# Patient Record
Sex: Male | Born: 1950
Health system: Southern US, Community
[De-identification: ages and names within clinical notes are randomized; demographics above are authoritative.]

## PROBLEM LIST (undated history)

## (undated) DIAGNOSIS — F529 Unspecified sexual dysfunction not due to a substance or known physiological condition: Secondary | ICD-10-CM

## (undated) DIAGNOSIS — F068 Other specified mental disorders due to known physiological condition: Secondary | ICD-10-CM

## (undated) DIAGNOSIS — G35D Multiple sclerosis, unspecified: Secondary | ICD-10-CM

## (undated) DIAGNOSIS — H579 Unspecified disorder of eye and adnexa: Secondary | ICD-10-CM

## (undated) DIAGNOSIS — L408 Other psoriasis: Secondary | ICD-10-CM

## (undated) DIAGNOSIS — F3289 Other specified depressive episodes: Secondary | ICD-10-CM

## (undated) DIAGNOSIS — G35 Multiple sclerosis: Secondary | ICD-10-CM

## (undated) DIAGNOSIS — R413 Other amnesia: Secondary | ICD-10-CM

## (undated) DIAGNOSIS — F329 Major depressive disorder, single episode, unspecified: Secondary | ICD-10-CM

## (undated) HISTORY — PX: TONSILLECTOMY: SUR1361

## (undated) HISTORY — PX: KNEE ARTHROSCOPY: SUR90

## (undated) HISTORY — DX: Other specified depressive episodes: F32.89

## (undated) HISTORY — DX: Other psoriasis: L40.8

## (undated) HISTORY — DX: Other specified mental disorders due to known physiological condition: F06.8

## (undated) HISTORY — DX: Unspecified disorder of eye and adnexa: H57.9

## (undated) HISTORY — DX: Other amnesia: R41.3

## (undated) HISTORY — DX: Major depressive disorder, single episode, unspecified: F32.9

## (undated) HISTORY — DX: Unspecified sexual dysfunction not due to a substance or known physiological condition: F52.9

---

## 2009-02-10 ENCOUNTER — Ambulatory Visit (HOSPITAL_COMMUNITY): Admission: RE | Admit: 2009-02-10 | Discharge: 2009-02-10 | Payer: Self-pay | Admitting: Specialist

## 2010-01-26 ENCOUNTER — Encounter: Payer: Self-pay | Admitting: Internal Medicine

## 2010-01-26 ENCOUNTER — Encounter (INDEPENDENT_AMBULATORY_CARE_PROVIDER_SITE_OTHER): Payer: Self-pay | Admitting: *Deleted

## 2010-03-18 DIAGNOSIS — H579 Unspecified disorder of eye and adnexa: Secondary | ICD-10-CM | POA: Insufficient documentation

## 2010-03-18 DIAGNOSIS — K5900 Constipation, unspecified: Secondary | ICD-10-CM | POA: Insufficient documentation

## 2010-03-18 DIAGNOSIS — F32A Depression, unspecified: Secondary | ICD-10-CM | POA: Insufficient documentation

## 2010-03-18 DIAGNOSIS — F329 Major depressive disorder, single episode, unspecified: Secondary | ICD-10-CM

## 2010-03-18 DIAGNOSIS — L408 Other psoriasis: Secondary | ICD-10-CM

## 2010-03-18 DIAGNOSIS — F09 Unspecified mental disorder due to known physiological condition: Secondary | ICD-10-CM

## 2010-03-18 DIAGNOSIS — K625 Hemorrhage of anus and rectum: Secondary | ICD-10-CM

## 2010-03-18 DIAGNOSIS — N529 Male erectile dysfunction, unspecified: Secondary | ICD-10-CM

## 2010-03-18 DIAGNOSIS — R634 Abnormal weight loss: Secondary | ICD-10-CM

## 2010-03-18 DIAGNOSIS — G35 Multiple sclerosis: Secondary | ICD-10-CM | POA: Insufficient documentation

## 2010-07-09 ENCOUNTER — Encounter: Admission: RE | Admit: 2010-07-09 | Discharge: 2010-07-09 | Payer: Self-pay | Admitting: Specialist

## 2010-11-08 NOTE — Letter (Signed)
Summary: New Patient letter  [facility]  [address] [place], [place] [address]   Phone: [phone]  Fax: [phone]       01/26/2010 MRN: [id number]  [facility] [address] [place], [place]  [address]  Dear Mr. BEADNELL,  Welcome to the Gastroenterology Division at [facility].    You are scheduled to see Dr. Juanda Chance on 03-23-10 at 9:15a.m. on the 3rd floor at [facility], [address]  We ask that you try to arrive at our office 15 minutes prior to your appointment time to allow for check-in.  We would like you to complete the enclosed self-administered evaluation form prior to your visit and bring it with you on the day of your appointment.  We will review it with you.  Also, please bring a complete list of all your medications or, if you prefer, bring the medication bottles and we will list them.  Please bring your insurance card so that we may make a copy of it.  If your insurance requires a referral to see a specialist, please bring your referral form from your primary care physician.  Co-payments are due at the time of your visit and may be paid by cash, check or credit card.     Your office visit will consist of a consult with your physician (includes a physical exam), any laboratory testing he/she may order, scheduling of any necessary diagnostic testing (e.g. x-ray, ultrasound, CT-scan), and scheduling of a procedure (e.g. Endoscopy, Colonoscopy) if required.  Please allow enough time on your schedule to allow for any/all of these possibilities.    If you cannot keep your appointment, please call [phone] to cancel or reschedule prior to your appointment date.  This allows [place] the opportunity to schedule an appointment for another patient in need of care.  If you do not cancel or reschedule by 5 p.m. the business day prior to your appointment date, you will be charged a $50.00 late cancellation/no-show fee.    Thank you for  choosing [facility] for your medical needs.  We appreciate the opportunity to care for you.  Please visit [place] at our website  to learn more about our practice.                     Sincerely,                                                             The Gastroenterology Division

## 2010-11-08 NOTE — Letter (Signed)
Summary: Office Kern Reap MD  Marlou Starks MD   Imported By: Sherian Rein 03/22/2010 14:13:30  _____________________________________________________________________  External Attachment:    Type:   Image     Comment:   External Document

## 2012-09-02 ENCOUNTER — Encounter (HOSPITAL_COMMUNITY): Payer: Self-pay | Admitting: Emergency Medicine

## 2012-09-02 ENCOUNTER — Emergency Department (HOSPITAL_COMMUNITY)
Admission: EM | Admit: 2012-09-02 | Discharge: 2012-09-02 | Disposition: A | Discharge status: Home / Self Care | Payer: Self-pay | Attending: Emergency Medicine | Admitting: Emergency Medicine

## 2012-09-02 ENCOUNTER — Emergency Department (HOSPITAL_COMMUNITY): Payer: Self-pay

## 2012-09-02 DIAGNOSIS — M549 Dorsalgia, unspecified: Secondary | ICD-10-CM | POA: Insufficient documentation

## 2012-09-02 DIAGNOSIS — F17200 Nicotine dependence, unspecified, uncomplicated: Secondary | ICD-10-CM | POA: Insufficient documentation

## 2012-09-02 DIAGNOSIS — R0789 Other chest pain: Secondary | ICD-10-CM | POA: Insufficient documentation

## 2012-09-02 DIAGNOSIS — G35 Multiple sclerosis: Secondary | ICD-10-CM | POA: Insufficient documentation

## 2012-09-02 HISTORY — DX: Multiple sclerosis, unspecified: G35.D

## 2012-09-02 HISTORY — DX: Multiple sclerosis: G35

## 2012-09-02 LAB — CBC WITH DIFFERENTIAL/PLATELET
Basophils Absolute: 0.1 10*3/uL (ref 0.0–0.1)
Eosinophils Absolute: 0.3 10*3/uL (ref 0.0–0.7)
Eosinophils Relative: 3 % (ref 0–5)
MCH: 31.4 pg (ref 26.0–34.0)
MCHC: 34.2 g/dL (ref 30.0–36.0)
MCV: 92 fL (ref 78.0–100.0)
Platelets: 216 10*3/uL (ref 150–400)
RDW: 13.3 % (ref 11.5–15.5)

## 2012-09-02 LAB — BASIC METABOLIC PANEL
Calcium: 9.5 mg/dL (ref 8.4–10.5)
GFR calc non Af Amer: >90 mL/min (ref >90)
Glucose, Bld: 115 mg/dL — ABNORMAL HIGH (ref 70–99)
Sodium: 140 mEq/L (ref 135–145)

## 2012-09-02 LAB — POCT I-STAT TROPONIN I: Troponin i, poc: 0.01 ng/mL (ref 0.00–0.08)

## 2012-09-02 MED ORDER — OXYCODONE-ACETAMINOPHEN 5-325 MG PO TABS
1.0000 | ORAL_TABLET | Freq: Once | ORAL | Status: AC
  Administered 2012-09-02: 1 via ORAL
  Filled 2012-09-02: qty 1

## 2012-09-02 MED ORDER — OXYCODONE-ACETAMINOPHEN 5-325 MG PO TABS: 1.0000 | ORAL_TABLET | ORAL | Status: DC | PRN

## 2012-09-02 NOTE — ED Notes (Signed)
Pt c/o left sided CP x 2 weeks that is worse with movement; pt denies SOB or nausea

## 2012-09-02 NOTE — ED Notes (Addendum)
Pt c/o intermittent left chest pain that "feels like throbbing", states that at it's worst he would rate it a 5/10. NAD, A&Ox4, ambulatory.

## 2012-09-02 NOTE — ED Provider Notes (Signed)
History     CSN: [id number]  Arrival date & time 09/02/12  1332   First MD Initiated Contact with Patient 09/02/12 1605      Chief Complaint  Patient presents with  . Chest Pain    (Consider location/radiation/quality/duration/timing/severity/associated sxs/prior treatment) Patient is a 61 y.o. male presenting with chest pain. The history is provided by the patient and a friend. No language interpreter was used.  Chest Pain The chest pain began 1 - 2 weeks ago. Chest pain occurs constantly. At its most intense, the pain is at 4/10. The pain is currently at 2/10. The severity of the pain is mild. The quality of the pain is described as dull, heavy and aching. The pain does not radiate. Chest pain is worsened by certain positions. Pertinent negatives for primary symptoms include no fever, no fatigue, no syncope, no shortness of breath, no cough, no nausea, no vomiting and no dizziness.  Pertinent negatives for associated symptoms include no numbness and no weakness. He tried NSAIDs for the symptoms. Risk factors include male gender, substance abuse and smoking/tobacco exposure.    61yo male with the complaint of left chest pain x 2 weeks especially when he is lying down at night going to sleep on his left side. States the worst pain is a 4/10. States that the pain is a dull constant pain and it is 2/10 today. States that the pain is better after he takes Aleve. The pain does radiate to the L scapula when laying down. Causes no  shortness of breath or nausea. Patient has MS and goes to a neurologist. Patient does not have a PCP. Patient does smoke. He does not know if he  has high cholesterol. Patient denies any unilateral leg pain or long trips. Wells criteria met.     Past Medical History  Diagnosis Date  . Multiple sclerosis     History reviewed. No pertinent past surgical history.  History reviewed. No pertinent family history.  History  Substance Use Topics  . Smoking status:  Current Every Day Smoker  . Smokeless tobacco: Not on file  . Alcohol Use: Yes      Review of Systems  Constitutional: Negative for fever and fatigue.  Respiratory: Negative for cough and shortness of breath.   Cardiovascular: Positive for chest pain. Negative for syncope.  Gastrointestinal: Negative for nausea and vomiting.  Musculoskeletal: Positive for back pain.  Neurological: Negative for dizziness, syncope, weakness, light-headedness and numbness.    Allergies  Review of patient's allergies indicates no known allergies.  Home Medications  No current outpatient prescriptions on file.  BP 145/80  Pulse 65  Temp 98.2 F (36.8 C) (Oral)  Resp 16  SpO2 96%  Physical Exam  Nursing note and vitals reviewed. Constitutional: He is oriented to person, place, and time. He appears well-developed and well-nourished.  HENT:  Head: Normocephalic.  Eyes: Conjunctivae normal and EOM are normal. Pupils are equal, round, and reactive to light.  Neck: Normal range of motion. Neck supple.  Cardiovascular: Normal rate.   Pulmonary/Chest: Effort normal and breath sounds normal. No respiratory distress. He has no wheezes. He has no rales.  Abdominal: Soft. Bowel sounds are normal. He exhibits no distension. There is no tenderness.  Musculoskeletal: Normal range of motion.  Neurological: He is alert and oriented to person, place, and time.  Skin: Skin is warm and dry.  Psychiatric: He has a normal mood and affect.    ED Course  Procedures (including critical care time)  Labs Reviewed  BASIC METABOLIC PANEL - Abnormal; Notable for the following:    Glucose, Bld 115 (*)     All other components within normal limits  CBC WITH DIFFERENTIAL  POCT I-STAT TROPONIN I  D-DIMER, QUANTITATIVE   Dg Chest 2 View  09/02/2012  *RADIOLOGY REPORT*  Clinical Data: Chest pain.  CHEST - 2 VIEW  Comparison: None.  Findings: Two views of the chest demonstrate clear lungs. Heart and mediastinum are  within normal limits.  There are degenerative changes in the thoracic spine.  The trachea is midline. Degenerative facet changes in the lower cervical spine.  IMPRESSION: No acute cardiopulmonary disease.   Original Report Authenticated By: Richarda Overlie, M.D.      No diagnosis found.    MDM  61 yo male with c/o L chest pain that radiates to L scapula x 2 weeks.  Chest x-ray unremarkable and reviewed by myself.  Trop -.  All other labs unremarkable.  Atypical for ACS.  Patient and sister agree he will get a pcp to follow up with this week. He will call [facility] to see him this week as well.     Date: 09/02/2012  Rate: 68  Rhythm: normal sinus rhythm  QRS Axis: normal  Intervals: normal  ST/T Wave abnormalities: normal  Conduction Disutrbances:none  Narrative Interpretation:   Old EKG Reviewed: none available          Remi Haggard, NP 09/02/12 2200

## 2012-09-04 NOTE — ED Provider Notes (Signed)
Medical screening examination/treatment/procedure(s) were performed by non-physician practitioner and as supervising physician I was immediately available for consultation/collaboration.   Suzi Roots, MD 09/04/12 [phone]

## 2012-09-06 ENCOUNTER — Encounter: Payer: Self-pay | Admitting: *Deleted

## 2012-09-09 ENCOUNTER — Institutional Professional Consult (permissible substitution): Payer: Self-pay | Admitting: Cardiovascular Disease

## 2012-09-27 ENCOUNTER — Encounter: Payer: Self-pay | Admitting: Cardiovascular Disease

## 2012-10-23 ENCOUNTER — Encounter: Payer: Self-pay | Admitting: Cardiovascular Disease

## 2014-04-08 DIAGNOSIS — G35 Multiple sclerosis: Secondary | ICD-10-CM | POA: Diagnosis not present

## 2014-06-11 DIAGNOSIS — G3184 Mild cognitive impairment, so stated: Secondary | ICD-10-CM | POA: Diagnosis not present

## 2014-06-11 DIAGNOSIS — E559 Vitamin D deficiency, unspecified: Secondary | ICD-10-CM | POA: Diagnosis not present

## 2014-06-11 DIAGNOSIS — N319 Neuromuscular dysfunction of bladder, unspecified: Secondary | ICD-10-CM | POA: Diagnosis not present

## 2014-06-11 DIAGNOSIS — G35 Multiple sclerosis: Secondary | ICD-10-CM | POA: Diagnosis not present

## 2014-06-24 DIAGNOSIS — G35 Multiple sclerosis: Secondary | ICD-10-CM | POA: Diagnosis not present

## 2014-09-10 DIAGNOSIS — E559 Vitamin D deficiency, unspecified: Secondary | ICD-10-CM | POA: Diagnosis not present

## 2014-09-10 DIAGNOSIS — G3184 Mild cognitive impairment, so stated: Secondary | ICD-10-CM | POA: Diagnosis not present

## 2014-09-10 DIAGNOSIS — G35 Multiple sclerosis: Secondary | ICD-10-CM | POA: Diagnosis not present

## 2014-09-22 DIAGNOSIS — Z23 Encounter for immunization: Secondary | ICD-10-CM | POA: Diagnosis not present

## 2015-01-12 ENCOUNTER — Telehealth: Payer: Self-pay | Admitting: *Deleted

## 2015-01-12 NOTE — Telephone Encounter (Signed)
LMOM (cell #) for pt. to call.  Home # is not in service.  He was incorrectly scheculed in a f/u appt. on 4-13 instead of a new pt. appt.--I need to r/s him.  Pt. returned my call as I was still typing this encounter--I have rescheduled him/fim

## 2015-01-15 ENCOUNTER — Ambulatory Visit (INDEPENDENT_AMBULATORY_CARE_PROVIDER_SITE_OTHER): Payer: Medicare Other | Admitting: Neurology

## 2015-01-15 ENCOUNTER — Encounter: Payer: Self-pay | Admitting: Neurology

## 2015-01-15 VITALS — BP 138/74 | HR 64 | Resp 14 | Ht 73.0 in | Wt 207.0 lb

## 2015-01-15 DIAGNOSIS — F329 Major depressive disorder, single episode, unspecified: Secondary | ICD-10-CM

## 2015-01-15 DIAGNOSIS — K5900 Constipation, unspecified: Secondary | ICD-10-CM

## 2015-01-15 DIAGNOSIS — F09 Unspecified mental disorder due to known physiological condition: Secondary | ICD-10-CM

## 2015-01-15 DIAGNOSIS — G35 Multiple sclerosis: Secondary | ICD-10-CM | POA: Diagnosis not present

## 2015-01-15 DIAGNOSIS — R3911 Hesitancy of micturition: Secondary | ICD-10-CM | POA: Insufficient documentation

## 2015-01-15 DIAGNOSIS — Z79899 Other long term (current) drug therapy: Secondary | ICD-10-CM | POA: Diagnosis not present

## 2015-01-15 DIAGNOSIS — N521 Erectile dysfunction due to diseases classified elsewhere: Secondary | ICD-10-CM

## 2015-01-15 DIAGNOSIS — F32A Depression, unspecified: Secondary | ICD-10-CM

## 2015-01-15 MED ORDER — METHYLPHENIDATE HCL 10 MG PO TABS: ORAL_TABLET | ORAL | Status: DC

## 2015-01-15 MED ORDER — TAMSULOSIN HCL 0.4 MG PO CAPS: 0.4000 mg | ORAL_CAPSULE | Freq: Every day | ORAL | Status: DC

## 2015-01-15 NOTE — Progress Notes (Signed)
[facility]  PATIENT: Gabriel Hanson DOB: 1951-09-08  REFERRING DOCTOR OR PCP:  None  SOURCE: patient  _________________________________   HISTORICAL  CHIEF COMPLAINT:  Chief Complaint  Patient presents with  . Multiple Sclerosis    Sts. he tolerates Tecfidera well--but often forgets his evening dose.  He denies new or worsening sx/fim    HISTORY OF PRESENT ILLNESS:  Gabriel Hanson is a 64 yo man with MS.    He was diagnosed with MS in 2001 when he presented with right visual loss.     He had an MRI worrisome for MS.   He was placed on IV steroids and vision got 80% better.    He was placed on an interferon first and then Copaxone because he had tolerability issues.     For two years, he was without insurance and stopped all DMT.   I started seeing him about a year ago and we started Tecfidera.   He tolerates it well but sometimes forgets the second pill because all his medications are qAM.    Gait/strength/sensation:   He denies any major problem with gait though he feels off balanced, especially with turns.   He stumbles but never falls.    He denies weakness but has numbness and tingling in both feet that stated in his toes and more recently involves the whole distal foot, bilaterally.  He still feels pain but there is some tingling and allodynia.     Vision:   Although his right eye improved, he never reached baseline.  There is blurriness but colors seem ok.   He feels the left eye has learned to compensate for the right.  Also in 2001, he had an episode of diplopia x 2 weeks and used an eye patch while driving.  No diplopia or eye pain now.    Bladder/bowel:   He has urinary hesitancy and frequency.  He stopped the medication x one week and noted it was helping him a lot.  He denies UTI's.   He has constipation with painful BM's only 1 -2 times a week  Fatigue/Sleep:   He has physical and mental fatigue.   Ritalin did not help the physical fatigue but did help  the mental fog and mental fatigue.     He sleeps well most nights having insomnia only about once a month (sleep onset only)  Mood/Cognition:   He denies anxiety or depression now.  In 1999 he seriously contemplated suicide and went on anti-depressants but stopped after a while.  He never had another suicidal ideation.  However, he feels he gets moody at times.   He notes he cries easily if he watches a sad show.   He felt the ritalin helped his processing speed and ability to multi-task.   He was much less distractible on it.      I personally reviewed medical records and his MRI images from 07/09/2010. The MRI shows white matter foci in the periventricular and juxtacortical white matter consistent with the clinical diagnosis of MS. The right optic nerve was reduced in size. There w was one focus in the right pons and one in the left cerebellar hemisphere, as well. None of the foci were enhancing during that MRI. There is no definite change when compared to an earlier MRI dated 02/10/2009.  REVIEW OF SYSTEMS: Constitutional: No fevers, chills, sweats, or change in appetite Eyes: No visual changes, double vision, eye pain Ear, nose and throat: No hearing loss,  ear pain, nasal congestion, sore throat Cardiovascular: No chest pain, palpitations Respiratory: No shortness of breath at rest or with exertion.   No wheezes GastrointestinaI: No nausea, vomiting, diarrhea, abdominal pain, fecal incontinence.  Severe constipation Genitourinary: as above.  Musculoskeletal: No neck pain, back pain Integumentary: No rash, pruritus, skin lesions Neurological: as above Psychiatric: mild depression.  Some tearfulness inappropriately.  Cognitive decline.  No anxiety Endocrine: No palpitations, diaphoresis, change in appetite, change in weigh or increased thirst Hematologic/Lymphatic: No anemia, purpura, petechiae. Allergic/Immunologic: No itchy/runny eyes, nasal congestion, recent allergic reactions,  rashes  ALLERGIES: No Known Allergies  HOME MEDICATIONS:  Current outpatient prescriptions:  .  Dimethyl Fumarate 240 MG CPDR, Take 240 mg by mouth 2 (two) times daily., Disp: , Rfl:  .  Multiple Vitamin (MULTIVITAMIN WITH MINERALS) TABS, Take 1 tablet by mouth daily., Disp: , Rfl:  .  sodium chloride (OCEAN) 0.65 % nasal spray, Place 1 spray into the nose daily as needed. For congestion, Disp: , Rfl:  .  tamsulosin (FLOMAX) 0.4 MG CAPS capsule, Take 0.4 mg by mouth daily., Disp: , Rfl: 1 .  naproxen sodium (ANAPROX) 220 MG tablet, Take 220 mg by mouth 2 (two) times daily with a meal., Disp: , Rfl:   PAST MEDICAL HISTORY: Past Medical History  Diagnosis Date  . Multiple sclerosis   . 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 Depressive disorder, not elsewhere classified   . Other psoriasis   . Psychosexual dysfunction, unspecified   . Disorder of eye, unspecified   . Memory loss     PAST SURGICAL HISTORY: Past Surgical History  Procedure Laterality Date  . Tonsillectomy    . Knee arthroscopy      right     FAMILY HISTORY: Family History  Problem Relation Age of Onset  . Breast cancer Mother   . Heart disease    . COPD Father     SOCIAL HISTORY:  History   Social History  . Marital Status: Single    Spouse Name: N/A  . Number of Children: N/A  . Years of Education: N/A   Occupational History  . [employer]    Fulltime   Social History Main Topics  . Smoking status: Former Smoker    Quit date: 01/09/2015  . Smokeless tobacco: Not on file  . Alcohol Use: 0.0 oz/week    0 Standard drinks or equivalent per week     Comment: Occasionally/fim  . Drug Use: Yes    Special: Marijuana  . Sexual Activity: Not on file   Other Topics Concern  . Not on file   Social History Narrative     PHYSICAL EXAM  Filed Vitals:   01/15/15 0835  BP: 138/74  Pulse: 64  Resp: 14  Height:  (1.854 m)  Weight:  207 lb (93.895 kg)    Body mass index is 27.32 kg/(m^2).   General: The patient is well-developed and well-nourished and in no acute distress  Eyes:  Funduscopic exam shows normal optic discs and retinal vessels.  Neck: The neck is supple, no carotid bruits are noted.  The neck is nontender.  Cardiovascular: The heart has a regular rate and rhythm with a normal S1 and S2. There were no murmurs, gallops or rubs. Lungs are clear to auscultation.  Skin: Extremities are without significant edema.  Musculoskeletal:  Back is nontender  Neurologic Exam  Mental status: The patient is alert and oriented x 3  at the time of the examination. The patient has apparent normal recent and remote memory, with an apparently normal attention span and concentration ability.   Speech is normal.  Cranial nerves: Extraocular movements are full. Pupils show 1+ right APD   Mild color desat's on right.  Visual fields are full.  Facial symmetry is present. There is good facial sensation to soft touch bilaterally.Facial strength is normal.  Trapezius and sternocleidomastoid strength is normal. No dysarthria is noted.  The tongue is midline, and the patient has symmetric elevation of the soft palate. No obvious hearing deficits are noted.  Motor:  Muscle bulk is normal.   Tone is normal. Strength is  5 / 5 in all 4 extremities.   Sensory: Sensory testing is intact to pinprick, soft touch and vibration sensation in arms but decreased vibration at toes and ankle.  Coordination: Cerebellar testing reveals good finger-nose-finger and heel-to-shin bilaterally.  Gait and station: Station is normal.   Gait is normal. Tandem gait is mildly wide. Romberg is negative.   Reflexes: Deep tendon reflexes are symmetric and normal in arms and knees, absent at ankles .   Plantar responses are flexor.    DIAGNOSTIC DATA (LABS, IMAGING, TESTING) - I reviewed patient records, labs, notes, testing and imaging myself where  available.  Lab Results  Component Value Date   WBC 8.5 09/02/2012   HGB 15.0 09/02/2012   HCT 43.9 09/02/2012   MCV 92.0 09/02/2012   PLT 216 09/02/2012      Component Value Date/Time   NA 140 09/02/2012 1539   K 3.9 09/02/2012 1539   CL 102 09/02/2012 1539   CO2 30 09/02/2012 1539   GLUCOSE 115* 09/02/2012 1539   BUN 12 09/02/2012 1539   CREATININE 0.84 09/02/2012 1539   CALCIUM 9.5 09/02/2012 1539   GFRNONAA >90 09/02/2012 1539   GFRAA >90 09/02/2012 1539   No results found for: CHOL, HDL, LDLCALC, LDLDIRECT, TRIG, CHOLHDL No results found for: ZOXW9U No results found for: VITAMINB12 No results found for: TSH     ASSESSMENT AND PLAN  Multiple sclerosis - Plan: CBC with Differential/Platelet  High risk medication use - Plan: CBC with Differential/Platelet  Urinary hesitancy  Constipation, unspecified constipation type  Cognitive dysfunction  Depression  Erectile dysfunction due to diseases classified elsewhere   In summary, Gabriel Hanson is a 64 year old man with relapsing remitting multiple sclerosis currently being treated with Tecfidera. His main impairments are difficulties with cognition, fatigue and bladder.   He reports that the bladder function improved quite a bit on tamsulosin with much less hesitancy. However, he ran out of medications and notes that symptoms have returned. He believes he got a pretty significant benefit from methylphenidate with less distractibility and better cognition. I will increase his dose to 20 mg in the morning and 10 at noon. However, did not help his physical fatigue any. He notes easy tearfulness, especially with sad movies and we discussed that he likely has a mild form of pseudobulbar affect. The severity does not appear to be enough to warrant treatment at this point. We also discussed possible treatment for the erectile dysfunction. At this point, he does not think her treatment is necessary but he would consider in the  future I will go ahead and check a CBC to make sure that he does not have a lymphopenia as Tecfidera associated lymphopenia may be associated with a higher risk of PML.    I also advised him to try MiraLAX for  his constipation 1 scoop every other night.  He will return to see me in 4 months or sooner if he has new or worsening neurologic symptoms.   Gabriel Hanson A. Epimenio Foot, MD, PhD 01/15/2015, 8:45 AM Certified in Neurology, Clinical Neurophysiology, Sleep Medicine, Pain Medicine and Neuroimaging  [facility] [address] [place], [place] [address] [phone]

## 2015-01-16 LAB — CBC WITH DIFFERENTIAL/PLATELET
BASOS: 1 %
Basophils Absolute: 0 10*3/uL (ref 0.0–0.2)
EOS ABS: 0.1 10*3/uL (ref 0.0–0.4)
Eos: 2 %
HCT: 45.3 % (ref 37.5–51.0)
HEMOGLOBIN: 15.1 g/dL (ref 12.6–17.7)
IMMATURE GRANS (ABS): 0 10*3/uL (ref 0.0–0.1)
Immature Granulocytes: 0 %
LYMPHS ABS: 1 10*3/uL (ref 0.7–3.1)
Lymphs: 19 %
MCH: 30.9 pg (ref 26.6–33.0)
MCHC: 33.3 g/dL (ref 31.5–35.7)
MCV: 93 fL (ref 79–97)
MONOCYTES: 8 %
MONOS ABS: 0.4 10*3/uL (ref 0.1–0.9)
NEUTROS PCT: 70 %
Neutrophils Absolute: 3.4 10*3/uL (ref 1.4–7.0)
PLATELETS: 217 10*3/uL (ref 150–379)
RBC: 4.89 x10E6/uL (ref 4.14–5.80)
RDW: 13.6 % (ref 12.3–15.4)
WBC: 4.9 10*3/uL (ref 3.4–10.8)

## 2015-01-18 ENCOUNTER — Telehealth: Payer: Self-pay | Admitting: *Deleted

## 2015-01-18 NOTE — Telephone Encounter (Signed)
-----   Message from Asa Lente, MD sent at 01/17/2015  3:34 PM EDT ----- Blood counts are good.    Stay on Tecfidera

## 2015-01-18 NOTE — Telephone Encounter (Signed)
Spoke with Jonny Ruiz, and per RAS, advised labwork is ok; he should continue Tecfidera as rx'd.  Gurnie verbalized understanding of same/fim

## 2015-01-20 ENCOUNTER — Ambulatory Visit: Payer: Self-pay | Admitting: Neurology

## 2015-05-17 ENCOUNTER — Encounter: Payer: Self-pay | Admitting: Neurology

## 2015-05-17 ENCOUNTER — Ambulatory Visit (INDEPENDENT_AMBULATORY_CARE_PROVIDER_SITE_OTHER): Payer: Medicare Other | Admitting: Neurology

## 2015-05-17 VITALS — BP 114/70 | HR 68 | Resp 14 | Ht 73.0 in | Wt 194.0 lb

## 2015-05-17 DIAGNOSIS — G35 Multiple sclerosis: Secondary | ICD-10-CM

## 2015-05-17 DIAGNOSIS — R269 Unspecified abnormalities of gait and mobility: Secondary | ICD-10-CM

## 2015-05-17 DIAGNOSIS — F09 Unspecified mental disorder due to known physiological condition: Secondary | ICD-10-CM | POA: Diagnosis not present

## 2015-05-17 DIAGNOSIS — R200 Anesthesia of skin: Secondary | ICD-10-CM | POA: Diagnosis not present

## 2015-05-17 DIAGNOSIS — Z79899 Other long term (current) drug therapy: Secondary | ICD-10-CM

## 2015-05-17 DIAGNOSIS — K5909 Other constipation: Secondary | ICD-10-CM

## 2015-05-17 DIAGNOSIS — R3911 Hesitancy of micturition: Secondary | ICD-10-CM | POA: Diagnosis not present

## 2015-05-17 MED ORDER — METHYLPHENIDATE HCL ER (OSM) 36 MG PO TBCR: 36.0000 mg | EXTENDED_RELEASE_TABLET | Freq: Every day | ORAL | Status: DC

## 2015-05-17 NOTE — Progress Notes (Signed)
[facility]  PATIENT: Gabriel Hanson DOB: 1951-02-04  REFERRING DOCTOR OR PCP:  None  SOURCE: patient  _________________________________   HISTORICAL  CHIEF COMPLAINT:  Chief Complaint  Patient presents with  . Multiple Sclerosis    Sts. he tolerates Tecfidera well, but frequently forgets his pm dose.  Sts. fatigue is some worse--he isn't sure Ritalin helps./fim    HISTORY OF PRESENT ILLNESS:  Gabriel Hanson is a 64 yo man with MS.    His main concern is worsening fatigue.   He has been on Tecfidera since 2015.   He tolerates it well but sometimes misses the afternoon pill.     Fatigue/Attention/Sleep:   He reports physical and mental fatigue.   In the past , ritalin helpe the mental fog and mental fatigue and he got a lot more done.    He thinks he may have had ADD as a youth (his sister a childhood education PhD told him she felt he did when younger).  He sleeps well most nights having insomnia rarely.     He remains active and walks daily.     Gait/strength/sensation:   He feels gait is clumsy but walks without support.  .   He stumbles but never falls.    He denies weakness in limbs.   He has numbness in his feet and ankles.  He notes mild discomfort at times but not pain.  There is some tingling and allodynia.     Vision:   He sees black specks in his vision.   He had right ON in past.  There is blurriness but colors seem ok.   He feels the left eye has learned to compensate for the right. He had an episode of diplopia x 2 weeks many years ago and used an eye patch while driving.  No diplopia or eye pain now.    Bladder/bowel:   He had urinary hesitancy and frequency helped some by tamsulosin (still has frequency).    He denies UTI's.   He has constipation with painful BM's only 1 -2 times a week. MiraLAX as only helped a little bit.  Mood/Cognition:   He denies current anxiety or depression now has a history of a severe major depression and he had suicidal  ideation. He never acted.   He notes he cries easily if he watches a sad show.   He felt the ritalin helped his processing speed and ability to multi-task.   He was much less distractible on it.      MS History:  He was diagnosed with MS in 2001 when he presented with right visual loss.     He had an MRI worrisome for MS.   He was placed on IV steroids and vision got 80% better.    He was placed on an interferon first and then Copaxone because he had tolerability issues.     For two years, he was without insurance and stopped all DMT.   I started seeing him about a year ago and we started Tecfidera.   He tolerates it well but sometimes forgets the second pill because all his medications are qAM.  MRI brain 2011 shows white matter foci in the periventricular and juxtacortical white matter consistent with the clinical diagnosis of MS. The right optic nerve was reduced in size. There w was one focus in the right pons and one in the left cerebellar hemisphere, as well. None of the foci were enhancing during that MRI.  REVIEW OF SYSTEMS: Constitutional: No fevers, chills, sweats, or change in appetite Eyes: No visual changes, double vision, eye pain Ear, nose and throat: No hearing loss, ear pain, nasal congestion, sore throat Cardiovascular: No chest pain, palpitations Respiratory: No shortness of breath at rest or with exertion.   No wheezes GastrointestinaI: No nausea, vomiting, diarrhea, abdominal pain, fecal incontinence.  Severe constipation Genitourinary: as above.  Musculoskeletal: No neck pain, back pain Integumentary: No rash, pruritus, skin lesions Neurological: as above Psychiatric: mild depression.  Some tearfulness inappropriately.  Cognitive decline.  No anxiety Endocrine: No palpitations, diaphoresis, change in appetite, change in weigh or increased thirst Hematologic/Lymphatic: No anemia, purpura, petechiae. Allergic/Immunologic: No itchy/runny eyes, nasal congestion, recent  allergic reactions, rashes  ALLERGIES: No Known Allergies  HOME MEDICATIONS:  Current outpatient prescriptions:  .  Dimethyl Fumarate 240 MG CPDR, Take 240 mg by mouth 2 (two) times daily., Disp: , Rfl:  .  naproxen sodium (ANAPROX) 220 MG tablet, Take 220 mg by mouth 2 (two) times daily with a meal., Disp: , Rfl:  .  sodium chloride (OCEAN) 0.65 % nasal spray, Place 1 spray into the nose daily as needed. For congestion, Disp: , Rfl:  .  tamsulosin (FLOMAX) 0.4 MG CAPS capsule, Take 1 capsule (0.4 mg total) by mouth daily., Disp: 30 capsule, Rfl: 11 .  methylphenidate (RITALIN) 10 MG tablet, One or two in morning and one at noon (Patient not taking: Reported on 05/17/2015), Disp: 90 tablet, Rfl: 0 .  Multiple Vitamin (MULTIVITAMIN WITH MINERALS) TABS, Take 1 tablet by mouth daily., Disp: , Rfl:   PAST MEDICAL HISTORY: Past Medical History  Diagnosis Date  . Multiple sclerosis   . 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 Depressive disorder, not elsewhere classified   . Other psoriasis   . Psychosexual dysfunction, unspecified   . Disorder of eye, unspecified   . Memory loss     PAST SURGICAL HISTORY: Past Surgical History  Procedure Laterality Date  . Tonsillectomy    . Knee arthroscopy      right     FAMILY HISTORY: Family History  Problem Relation Age of Onset  . Breast cancer Mother   . Heart disease    . COPD Father     SOCIAL HISTORY:  History   Social History  . Marital Status: Single    Spouse Name: N/A  . Number of Children: N/A  . Years of Education: N/A   Occupational History  . [employer]    Fulltime   Social History Main Topics  . Smoking status: Former Smoker    Quit date: 01/09/2015  . Smokeless tobacco: Not on file  . Alcohol Use: 0.0 oz/week    0 Standard drinks or equivalent per week     Comment: Occasionally/fim  . Drug Use: Yes    Special: Marijuana  . Sexual Activity: Not on file    Other Topics Concern  . Not on file   Social History Narrative     PHYSICAL EXAM  Filed Vitals:   05/17/15 0831  BP: 114/70  Pulse: 68  Resp: 14  Height:  (1.854 m)  Weight: 194 lb (87.998 kg)    Body mass index is 25.6 kg/(m^2).   General: The patient is well-developed and well-nourished and in no acute distress  Eyes:  Funduscopic exam shows normal optic discs and retinal vessels.  Neck: The neck is supple, no carotid bruits are noted.  The  neck is nontender.  Cardiovascular: The heart has a regular rate and rhythm with a normal S1 and S2. There were no murmurs, gallops or rubs. Lungs are clear to auscultation.  Skin: Extremities are without significant edema.  Musculoskeletal:  Back is nontender  Neurologic Exam  Mental status: The patient is alert and oriented x 3 at the time of the examination. The patient has apparent normal recent and remote memory, with an apparently normal attention span and concentration ability.   Speech is normal.  Cranial nerves: Extraocular movements are full. Pupils show 1+ right APD   Mild color desat's on right.  Visual fields are full.  Facial symmetry is present. There is good facial sensation to soft touch bilaterally.Facial strength is normal.  Trapezius and sternocleidomastoid strength is normal. No dysarthria is noted.  The tongue is midline, and the patient has symmetric elevation of the soft palate. No obvious hearing deficits are noted.  Motor:  Muscle bulk is normal.   Tone is normal. Strength is  5 / 5 in all 4 extremities.   Sensory: Sensory testing is intact to pinprick, soft touch and vibration sensation in arms but decreased vibration at toes and ankle.  Coordination: Cerebellar testing reveals good finger-nose-finger and heel-to-shin bilaterally.  Gait and station: Station is normal.   Gait is normal. Tandem gait is mildly wide. Romberg is negative.   Reflexes: Deep tendon reflexes are symmetric and 3 + arms and  spread at knees, absent at ankles .   Plantar responses are flexor.    DIAGNOSTIC DATA (LABS, IMAGING, TESTING) - I reviewed patient records, labs, notes, testing and imaging myself where available.  Lab Results  Component Value Date   WBC 4.9 01/15/2015   HGB 15.1 01/15/2015   HCT 45.3 01/15/2015   MCV 93 01/15/2015   PLT 217 01/15/2015      Component Value Date/Time   NA 140 09/02/2012 1539   K 3.9 09/02/2012 1539   CL 102 09/02/2012 1539   CO2 30 09/02/2012 1539   GLUCOSE 115* 09/02/2012 1539   BUN 12 09/02/2012 1539   CREATININE 0.84 09/02/2012 1539   CALCIUM 9.5 09/02/2012 1539   GFRNONAA >90 09/02/2012 1539   GFRAA >90 09/02/2012 1539       ASSESSMENT AND PLAN  Multiple sclerosis - Plan: CBC with Differential/Platelet  Numbness - Plan: Vitamin B12  Cognitive dysfunction - Plan: CBC with Differential/Platelet, Vitamin B12  Gait disturbance  Urinary hesitancy  High risk medication use - Plan: CBC with Differential/Platelet  Other constipation   1.  He will continue Tecfidera. We will check CBC with differential to make sure that there is not a lymphopenia. 2.   Continue tamsulosin. Bladder function worsens further, I would like him to see a urologist. 3.  I will place him on Concerta 36 mg so that he can get by with one pill a day at a higher dose than he was on. 4.   I advised him to try MiraLAX for his constipation 1 scoop every other night. If the problem worsens, he should check with his primary care doctor or GI. 5.   He has symmetric numbness in the feet. This may not be MS related. I will check B12 and consider additional testing with vasculitis labs, SPEP/IEF and EMG/NCV if this worsens.  He will return to see me in 4 months or sooner if he has new or worsening neurologic symptoms.   Mekayla Soman A. Epimenio Foot, MD, PhD 05/17/2015, 8:40 AM Certified in  Neurology, Clinical Neurophysiology, Sleep Medicine, Pain Medicine and Neuroimaging  [facility] [address], [address] [place], [place] [address] [phone]      MS History

## 2015-05-18 ENCOUNTER — Telehealth: Payer: Self-pay | Admitting: *Deleted

## 2015-05-18 LAB — CBC WITH DIFFERENTIAL/PLATELET
BASOS ABS: 0 10*3/uL (ref 0.0–0.2)
Basos: 1 %
EOS (ABSOLUTE): 0.1 10*3/uL (ref 0.0–0.4)
Eos: 2 %
HEMATOCRIT: 41.3 % (ref 37.5–51.0)
Hemoglobin: 14 g/dL (ref 12.6–17.7)
IMMATURE GRANS (ABS): 0 10*3/uL (ref 0.0–0.1)
Immature Granulocytes: 0 %
LYMPHS ABS: 0.5 10*3/uL — AB (ref 0.7–3.1)
Lymphs: 14 %
MCH: 31 pg (ref 26.6–33.0)
MCHC: 33.9 g/dL (ref 31.5–35.7)
MCV: 92 fL (ref 79–97)
MONOS ABS: 0.4 10*3/uL (ref 0.1–0.9)
Monocytes: 10 %
Neutrophils Absolute: 2.8 10*3/uL (ref 1.4–7.0)
Neutrophils: 73 %
Platelets: 210 10*3/uL (ref 150–379)
RBC: 4.51 x10E6/uL (ref 4.14–5.80)
RDW: 13.4 % (ref 12.3–15.4)
WBC: 3.9 10*3/uL (ref 3.4–10.8)

## 2015-05-18 LAB — VITAMIN B12: Vitamin B-12: 203 pg/mL — ABNORMAL LOW (ref 211–946)

## 2015-05-18 NOTE — Telephone Encounter (Signed)
I have spoken with Gabriel Hanson this afternoon and per RAS, advised vit. b12 level is low, and he should have one b12 inj. weekly for one month, then monthly after that.  He sts. since he doesn't drive, it is very difficult to come into the office for inj.  He believes his sister would be able to give him shots.  He will ask her to call me at her convenience and I will discuss this with her, provide training if she is willing/able/fim

## 2015-05-18 NOTE — Telephone Encounter (Signed)
-----   Message from Asa Lente, MD sent at 05/18/2015  8:18 AM EDT ----- Vit B12 is low.    He should do one shot a week x 4 weeks then one shot a month.     If he thinks he can give self shots, I can call in script.   If not he can come in for shots Lymphocytes are mildly lower than expected and we will need to recheck at next visit

## 2015-05-26 ENCOUNTER — Ambulatory Visit (INDEPENDENT_AMBULATORY_CARE_PROVIDER_SITE_OTHER): Payer: Medicare Other | Admitting: *Deleted

## 2015-05-26 ENCOUNTER — Telehealth: Payer: Self-pay | Admitting: *Deleted

## 2015-05-26 DIAGNOSIS — E539 Vitamin B deficiency, unspecified: Secondary | ICD-10-CM | POA: Diagnosis not present

## 2015-05-26 MED ORDER — CYANOCOBALAMIN 1000 MCG/ML IJ SOLN
1000.0000 ug | Freq: Once | INTRAMUSCULAR | Status: AC
  Administered 2015-05-26: 1000 ug via INTRAMUSCULAR

## 2015-05-26 NOTE — Telephone Encounter (Signed)
I have spoken with Kmari this morning.  He sts.  his sister is not able to give vit. b12 inj.  He will come in at 1330 this afternoon and I will give inj. for him/fim

## 2015-05-26 NOTE — Telephone Encounter (Signed)
-----   Message from Richard A Sater, MD sent at 05/18/2015  8:18 AM EDT ----- Vit B12 is low.    He should do one shot a week x 4 weeks then one shot a month.     If he thinks he can give self shots, I can call in script.   If not he can come in for shots Lymphocytes are mildly lower than expected and we will need to recheck at next visit 

## 2015-05-26 NOTE — Telephone Encounter (Signed)
I have spoken with Gabriel Hanson today.  His sister is not able to give his vit. b12 injections.  He will come in at 1330 today for an injection/fim

## 2015-05-26 NOTE — Telephone Encounter (Signed)
Patient is returning your call regarding shots

## 2015-05-26 NOTE — Telephone Encounter (Signed)
Gabriel Hanson presented to the office this afternoon.  Vitamin B12, 1,000mg  given IM left deltoid.  BP 100/70, HR 64 and regular.  Resp. 14, even and unlabored.  He was d/c home after inj. and was ambulatory out of the office without difficulty.  He will return next Wednesday, 06-02-15 for second injection/fim

## 2015-06-02 ENCOUNTER — Ambulatory Visit (INDEPENDENT_AMBULATORY_CARE_PROVIDER_SITE_OTHER): Payer: Medicare Other | Admitting: *Deleted

## 2015-06-02 DIAGNOSIS — E538 Deficiency of other specified B group vitamins: Secondary | ICD-10-CM | POA: Diagnosis not present

## 2015-06-02 MED ORDER — CYANOCOBALAMIN 1000 MCG/ML IJ SOLN
1000.0000 ug | Freq: Once | INTRAMUSCULAR | Status: AC
  Administered 2015-06-02: 1000 ug via INTRAMUSCULAR

## 2015-06-09 ENCOUNTER — Telehealth: Payer: Self-pay | Admitting: Neurology

## 2015-06-09 ENCOUNTER — Ambulatory Visit (INDEPENDENT_AMBULATORY_CARE_PROVIDER_SITE_OTHER): Payer: Medicare Other | Admitting: *Deleted

## 2015-06-09 DIAGNOSIS — E539 Vitamin B deficiency, unspecified: Secondary | ICD-10-CM

## 2015-06-09 MED ORDER — CYANOCOBALAMIN 1000 MCG/ML IJ SOLN
1000.0000 ug | Freq: Once | INTRAMUSCULAR | Status: AC
  Administered 2015-06-09: 1000 ug via INTRAMUSCULAR

## 2015-06-09 NOTE — Telephone Encounter (Signed)
Called again.  Got no answer.  Unable to leave message.

## 2015-06-09 NOTE — Telephone Encounter (Signed)
The patient is wanting a refill on methylphenidate. The best number to contact is [phone]

## 2015-06-09 NOTE — Telephone Encounter (Signed)
Rx was last written on 08/08 for a 30 day supply.  I called back.  Got no answer.  VM has not been set up yet, unable to leave message.

## 2015-06-10 NOTE — Telephone Encounter (Signed)
I called again.  Got no answer.  Unable to leave message.  

## 2015-06-15 ENCOUNTER — Other Ambulatory Visit: Payer: Self-pay

## 2015-06-15 MED ORDER — METHYLPHENIDATE HCL ER (OSM) 36 MG PO TBCR: 36.0000 mg | EXTENDED_RELEASE_TABLET | Freq: Every day | ORAL | Status: DC

## 2015-06-15 NOTE — Telephone Encounter (Signed)
Request has been approved by provider  

## 2015-06-16 ENCOUNTER — Ambulatory Visit (INDEPENDENT_AMBULATORY_CARE_PROVIDER_SITE_OTHER): Payer: Medicare Other | Admitting: *Deleted

## 2015-06-16 ENCOUNTER — Encounter: Payer: Self-pay | Admitting: *Deleted

## 2015-06-16 DIAGNOSIS — E538 Deficiency of other specified B group vitamins: Secondary | ICD-10-CM

## 2015-06-16 MED ORDER — CYANOCOBALAMIN 1000 MCG/ML IJ SOLN
1000.0000 ug | Freq: Once | INTRAMUSCULAR | Status: AC
  Administered 2015-06-16: 1000 ug via INTRAMUSCULAR

## 2015-06-16 NOTE — Progress Notes (Signed)
Concerta rx. up front [facility]/fim 

## 2015-06-23 ENCOUNTER — Telehealth: Payer: Self-pay | Admitting: Neurology

## 2015-06-23 MED ORDER — DIMETHYL FUMARATE 240 MG PO CPDR: 240.0000 mg | DELAYED_RELEASE_CAPSULE | Freq: Two times a day (BID) | ORAL | Status: DC

## 2015-06-23 NOTE — Telephone Encounter (Signed)
Patient called stating he is out of Tecfidera. He said it has lasted longer than it should have because he would forget to take the 2nd dose daily. He called [facility] which said they needed prior auth and was waiting for Dr office to respond. They also referred him to [facility].  He is confused. Please call and advise. He can be reached at [phone].

## 2015-06-23 NOTE — Telephone Encounter (Signed)
I called [facility].  Spoke with [employer].  He reviewed the patient file and said he does not see that a prior Berkley Harvey is needed.  States the pharmacy has changed to [facility], and it appears they just need a new Rx.  Prescription has been sent.  I called the patient back to advise.  Got no answer.  There was no option to leave message, as VM was not set up.

## 2015-06-30 ENCOUNTER — Telehealth: Payer: Self-pay | Admitting: Neurology

## 2015-06-30 NOTE — Telephone Encounter (Signed)
Pt called and is out of Tysabri, he needs a rx. He has been out since 9/18, Sunday. Pt says that the maker has changed but he can not remember what the name. [phone]

## 2015-07-10 DIAGNOSIS — Z23 Encounter for immunization: Secondary | ICD-10-CM | POA: Diagnosis not present

## 2015-07-14 ENCOUNTER — Telehealth: Payer: Self-pay | Admitting: Neurology

## 2015-07-14 ENCOUNTER — Ambulatory Visit (INDEPENDENT_AMBULATORY_CARE_PROVIDER_SITE_OTHER): Payer: Medicare Other | Admitting: *Deleted

## 2015-07-14 DIAGNOSIS — G35 Multiple sclerosis: Secondary | ICD-10-CM

## 2015-07-14 DIAGNOSIS — E538 Deficiency of other specified B group vitamins: Secondary | ICD-10-CM

## 2015-07-14 MED ORDER — CYANOCOBALAMIN 1000 MCG/ML IJ SOLN
1000.0000 ug | INTRAMUSCULAR | Status: DC
  Administered 2015-07-14 – 2015-09-16 (×4): 1000 ug via INTRAMUSCULAR

## 2015-07-14 NOTE — Telephone Encounter (Signed)
Pt called to tell this office he would be 15 min late. This operator told the pt that per policy for a 15 min appt. If pt was 5 or more min late we would r/s the appt and pt would not be seen. He said he was half way here and would still come and ask to be seen.

## 2015-07-14 NOTE — Telephone Encounter (Signed)
This is not a problem.  Pt. presented to the office just after 1pm and inj. was given/fim

## 2015-08-11 ENCOUNTER — Ambulatory Visit (INDEPENDENT_AMBULATORY_CARE_PROVIDER_SITE_OTHER): Payer: Medicare Other | Admitting: *Deleted

## 2015-08-11 DIAGNOSIS — E538 Deficiency of other specified B group vitamins: Secondary | ICD-10-CM | POA: Diagnosis not present

## 2015-08-11 DIAGNOSIS — G35 Multiple sclerosis: Secondary | ICD-10-CM | POA: Diagnosis not present

## 2015-09-16 ENCOUNTER — Ambulatory Visit (INDEPENDENT_AMBULATORY_CARE_PROVIDER_SITE_OTHER): Payer: Medicare Other | Admitting: Neurology

## 2015-09-16 ENCOUNTER — Encounter: Payer: Self-pay | Admitting: Neurology

## 2015-09-16 VITALS — BP 118/76 | HR 72 | Resp 14 | Ht 73.0 in | Wt 192.4 lb

## 2015-09-16 DIAGNOSIS — G35 Multiple sclerosis: Secondary | ICD-10-CM

## 2015-09-16 DIAGNOSIS — N521 Erectile dysfunction due to diseases classified elsewhere: Secondary | ICD-10-CM | POA: Diagnosis not present

## 2015-09-16 DIAGNOSIS — E538 Deficiency of other specified B group vitamins: Secondary | ICD-10-CM | POA: Diagnosis not present

## 2015-09-16 DIAGNOSIS — R3911 Hesitancy of micturition: Secondary | ICD-10-CM

## 2015-09-16 DIAGNOSIS — R200 Anesthesia of skin: Secondary | ICD-10-CM | POA: Diagnosis not present

## 2015-09-16 DIAGNOSIS — R269 Unspecified abnormalities of gait and mobility: Secondary | ICD-10-CM

## 2015-09-16 DIAGNOSIS — Z79899 Other long term (current) drug therapy: Secondary | ICD-10-CM | POA: Diagnosis not present

## 2015-09-16 MED ORDER — TAMSULOSIN HCL 0.4 MG PO CAPS: 0.4000 mg | ORAL_CAPSULE | Freq: Every day | ORAL | Status: DC

## 2015-09-16 MED ORDER — BUPROPION HCL ER (XL) 150 MG PO TB24: 150.0000 mg | ORAL_TABLET | Freq: Every day | ORAL | Status: DC

## 2015-09-16 NOTE — Progress Notes (Signed)
[facility]  PATIENT: Gabriel Hanson DOB: 1951-01-30  REFERRING DOCTOR OR PCP:  None  SOURCE: patient  _________________________________   HISTORICAL  CHIEF COMPLAINT:  Chief Complaint  Patient presents with  . Multiple Sclerosis    Sts. he continues to tolerate Tecfidera well, is doing a better job at remembering his evening dose--sts. has only missed one dose in the last couple of weeks.  Sts. feels happier and less fatigued since starting vit. B12 inj.  Sts. is not taking Ritalin at all now./fim    HISTORY OF PRESENT ILLNESS:  Gabriel Hanson is a 64 yo man with MS.   His B12 was low and he is receiving IM injections.    He feels faituge and eye sight are both better since starting.   He has been on Tecfidera since 2015.   Compliance is better the past 2 months and he has only missed a couple doses.  Gait/strength/sensation:   He feels gait is clumsy but walks without support. He [place] has a torn meniscus in the right knee.    He is walking 5 miles a day.     He stumbles but never falls.    He denies weakness in limbs.   He has numbness in his feet and ankles.   There is mild tingling and allodynia.     Vision:   He had right ON in past. He has had some reduced VF on the right  He feels vision is better on B12.   Colors seem ok.    He had an episode of diplopia x 2 weeks many years ago and used an eye patch while driving.  No recent diplopia or eye pain now.    Bladder/bowel:   He had urinary hesitancy and frequency helped some by tamsulosin (still has frequency).    He denies UTI's.   He has constipation with painful BM's only 1 -2 times a week. MiraLAX did not help much.  Fatigue/Attention/Sleep:   He reports physical and mental fatigue.   He had ADD as a child and has had attentional problems.   He does bette rwith Ritalin but since starting B12 he feels he does not need it as much.    He sleeps well most nights having insomnia rarely.     He remains active and  walks daily.     Mood:   He is noting depression and feels his mood swings easily.    He doesn't get out of the house much.   He has a history of a severe major depression and he had suicidal ideation once but never acted.   He notes he cries easily if he watches a sad show.  Occasionally, he might laugh if sad or cry if happy.   Cognition:   He has had difficulties with attention and processing speed.    He felt the ritalin helped his processing speed and ability to multi-task but now that he is on B12, he doesn't feel he needs it as much.   .     MS History:  He was diagnosed with MS in 2001 when he presented with right visual loss.     He had an MRI worrisome for MS.   He was placed on IV steroids and vision got 80% better.    He was placed on an interferon first and then Copaxone because he had tolerability issues.     For two years, he was without insurance and stopped all DMT.  I started seeing him about a year ago and we started Tecfidera.   He tolerates it well but sometimes forgets the second pill because all his medications are qAM.  MRI brain 2011 shows white matter foci in the periventricular and juxtacortical white matter consistent with the clinical diagnosis of MS. The right optic nerve was reduced in size. There w was one focus in the right pons and one in the left cerebellar hemisphere, as well. None of the foci were enhancing during that MRI.    REVIEW OF SYSTEMS: Constitutional: No fevers, chills, sweats, or change in appetite Eyes: No visual changes, double vision, eye pain Ear, nose and throat: No hearing loss, ear pain, nasal congestion, sore throat Cardiovascular: No chest pain, palpitations Respiratory: No shortness of breath at rest or with exertion.   No wheezes GastrointestinaI: No nausea, vomiting, diarrhea, abdominal pain, fecal incontinence.  Severe constipation Genitourinary: as above.  Musculoskeletal: No neck pain, back pain Integumentary: No rash, pruritus,  skin lesions Neurological: as above Psychiatric: Depression.  Some tearfulness inappropriately.  Cognitive decline.  No anxiety Endocrine: No palpitations, diaphoresis, change in appetite, change in weigh or increased thirst Hematologic/Lymphatic: No anemia, purpura, petechiae. Allergic/Immunologic: No itchy/runny eyes, nasal congestion, recent allergic reactions, rashes  ALLERGIES: No Known Allergies  HOME MEDICATIONS:  Current outpatient prescriptions:  .  Dimethyl Fumarate 240 MG CPDR, Take 1 capsule (240 mg total) by mouth 2 (two) times daily., Disp: 60 capsule, Rfl: 6 .  naproxen sodium (ANAPROX) 220 MG tablet, Take 220 mg by mouth 2 (two) times daily with a meal., Disp: , Rfl:  .  tamsulosin (FLOMAX) 0.4 MG CAPS capsule, Take 1 capsule (0.4 mg total) by mouth daily., Disp: 30 capsule, Rfl: 11 .  methylphenidate (CONCERTA) 36 MG PO CR tablet, Take 1 tablet (36 mg total) by mouth daily. (Patient not taking: Reported on 09/16/2015), Disp: 30 tablet, Rfl: 0 .  Multiple Vitamin (MULTIVITAMIN WITH MINERALS) TABS, Take 1 tablet by mouth daily., Disp: , Rfl:  .  sodium chloride (OCEAN) 0.65 % nasal spray, Place 1 spray into the nose daily as needed. For congestion, Disp: , Rfl:   Current facility-administered medications:  .  cyanocobalamin ((VITAMIN B-12)) injection 1,000 mcg, 1,000 mcg, Intramuscular, Q30 days, Asa Lente, MD, 1,000 mcg at 08/11/15 1715  PAST MEDICAL HISTORY: Past Medical History  Diagnosis Date  . Multiple sclerosis (HCC)   . 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 Depressive disorder, not elsewhere classified   . Other psoriasis   . Psychosexual dysfunction, unspecified   . Disorder of eye, unspecified   . Memory loss     PAST SURGICAL HISTORY: Past Surgical History  Procedure Laterality Date  . Tonsillectomy    . Knee arthroscopy      right     FAMILY HISTORY: Family History  Problem Relation Age of Onset  .  Breast cancer Mother   . Heart disease    . COPD Father     SOCIAL HISTORY:  Social History   Social History  . Marital Status: Single    Spouse Name: N/A  . Number of Children: N/A  . Years of Education: N/A   Occupational History  . [employer]    Fulltime   Social History Main Topics  . Smoking status: Former Smoker    Quit date: 01/09/2015  . Smokeless tobacco: Not on file  . Alcohol Use: 0.0 oz/week    0 Standard drinks  or equivalent per week     Comment: Occasionally/fim  . Drug Use: Yes    Special: Marijuana  . Sexual Activity: Not on file   Other Topics Concern  . Not on file   Social History Narrative     PHYSICAL EXAM  Filed Vitals:   09/16/15 0841  BP: 118/76  Pulse: 72  Resp: 14  Height:  (1.854 m)  Weight: 192 lb 6.4 oz (87.272 kg)    Body mass index is 25.39 kg/(m^2).   General: The patient is well-developed and well-nourished and in no acute distress  Neurologic Exam  Mental status: The patient is alert and oriented x 3 at the time of the examination. The patient has apparent normal recent and remote memory, with an apparently normal  concentration ability but some distractibility.   Speech is normal.  Cranial nerves: Extraocular movements are full. Pupils show 1+ right APD  olor desaturated on right.  Visual fields are full.  Facial symmetry is present. There is good facial sensation to soft touch bilaterally.Facial strength is normal.  Trapezius and sternocleidomastoid strength is normal. No dysarthria is noted.  The tongue is midline, and the patient has symmetric elevation of the soft palate. No obvious hearing deficits are noted.  Motor:  Muscle bulk is normal.   Tone is normal. Strength is  5 / 5 in all 4 extremities.   Sensory: Sensory testing is intact to pinprick, soft touch and vibration sensation in arms but decreased vibration at toes and ankle.  Coordination: Cerebellar testing reveals good  finger-nose-finger and slightly reduced heel-to-shin bilaterally.  Gait and station: Station is normal.   Gait is normal. Tandem gait is mildly wide. Romberg is negative.   Reflexes: Deep tendon reflexes are symmetric and 3 + arms and spread at knees, trace at ankles .       DIAGNOSTIC DATA (LABS, IMAGING, TESTING) - I reviewed patient records, labs, notes, testing and imaging myself where available.  Lab Results  Component Value Date   WBC 3.9 05/17/2015   HGB 15.1 01/15/2015   HCT 41.3 05/17/2015   MCV 93 01/15/2015   PLT 217 01/15/2015      Component Value Date/Time   NA 140 09/02/2012 1539   K 3.9 09/02/2012 1539   CL 102 09/02/2012 1539   CO2 30 09/02/2012 1539   GLUCOSE 115* 09/02/2012 1539   BUN 12 09/02/2012 1539   CREATININE 0.84 09/02/2012 1539   CALCIUM 9.5 09/02/2012 1539   GFRNONAA >90 09/02/2012 1539   GFRAA >90 09/02/2012 1539       ASSESSMENT AND PLAN  MULTIPLE SCLEROSIS  High risk medication use  Numbness  Gait disturbance  Erectile dysfunction due to diseases classified elsewhere  Urinary hesitancy   1.  Continue Tecfidera. We will check CBC with differential to make sure that there is not a lymphopenia.    We will check another MRI next year to make sure there is no subclinical progression.   2.   Continue tamsulosin.   Continue B12 3.  Stay active    Continue to walk daily 4.  Add a stool softener 5.  He will return to see me in 6 months or sooner if he has new or worsening neurologic symptoms.   Katheryn Culliton A. Epimenio Foot, MD, PhD 09/16/2015, 8:48 AM Certified in Neurology, Clinical Neurophysiology, Sleep Medicine, Pain Medicine and Neuroimaging  [facility] [address] [place], [place] [address] [phone]      MS  History

## 2015-09-17 ENCOUNTER — Telehealth: Payer: Self-pay | Admitting: *Deleted

## 2015-09-17 LAB — CBC WITH DIFFERENTIAL/PLATELET
BASOS: 1 %
Basophils Absolute: 0 10*3/uL (ref 0.0–0.2)
EOS (ABSOLUTE): 0.1 10*3/uL (ref 0.0–0.4)
EOS: 2 %
HEMOGLOBIN: 15.3 g/dL (ref 12.6–17.7)
Hematocrit: 44.5 % (ref 37.5–51.0)
IMMATURE GRANS (ABS): 0 10*3/uL (ref 0.0–0.1)
IMMATURE GRANULOCYTES: 0 %
Lymphocytes Absolute: 0.5 10*3/uL — ABNORMAL LOW (ref 0.7–3.1)
Lymphs: 10 %
MCH: 31.7 pg (ref 26.6–33.0)
MCHC: 34.4 g/dL (ref 31.5–35.7)
MCV: 92 fL (ref 79–97)
Monocytes Absolute: 0.6 10*3/uL (ref 0.1–0.9)
Monocytes: 13 %
NEUTROS ABS: 3.7 10*3/uL (ref 1.4–7.0)
Neutrophils: 74 %
Platelets: 239 10*3/uL (ref 150–379)
RBC: 4.82 x10E6/uL (ref 4.14–5.80)
RDW: 13.8 % (ref 12.3–15.4)
WBC: 5 10*3/uL (ref 3.4–10.8)

## 2015-09-17 NOTE — Telephone Encounter (Signed)
-----   Message from Richard A Sater, MD sent at 09/17/2015 12:24 PM EST ----- Lymphocyte counts are lower than we would like to see while on Tecfidera. If low low long-term, this can increase the risk of certain types of severe infections. Therefore, I would like to recheck the CBC with differential in 2 months. If lymphocyte count has not increased, we will need to consider a different medicine. 

## 2015-09-17 NOTE — Telephone Encounter (Signed)
I have spoken with Gabriel Hanson this afternoon and per RAS, advised that lymphocytes are lower than RAS likes while on Tecfidera, that if they stay low for a long time, he may be at an increased risk for developing certain severe infections.  I advised of the need to recheck cbc with diff in 2 mos.  He verbalized understanding of same./fim

## 2015-09-20 ENCOUNTER — Telehealth: Payer: Self-pay | Admitting: Neurology

## 2015-09-20 MED ORDER — DIMETHYL FUMARATE 240 MG PO CPDR: 240.0000 mg | DELAYED_RELEASE_CAPSULE | Freq: Two times a day (BID) | ORAL | Status: DC

## 2015-09-20 NOTE — Telephone Encounter (Signed)
Pt called and needs a refill on Dimethyl Fumarate 240 MG CPDR. Please send to [facility]. Thank you

## 2015-10-18 ENCOUNTER — Ambulatory Visit: Payer: Medicare Other

## 2015-10-19 DIAGNOSIS — S42295A Other nondisplaced fracture of upper end of left humerus, initial encounter for closed fracture: Secondary | ICD-10-CM | POA: Diagnosis not present

## 2015-10-19 DIAGNOSIS — S42302A Unspecified fracture of shaft of humerus, left arm, initial encounter for closed fracture: Secondary | ICD-10-CM | POA: Diagnosis not present

## 2015-10-19 DIAGNOSIS — M25512 Pain in left shoulder: Secondary | ICD-10-CM | POA: Diagnosis not present

## 2015-11-11 DIAGNOSIS — S42295D Other nondisplaced fracture of upper end of left humerus, subsequent encounter for fracture with routine healing: Secondary | ICD-10-CM | POA: Diagnosis not present

## 2015-11-12 ENCOUNTER — Ambulatory Visit (INDEPENDENT_AMBULATORY_CARE_PROVIDER_SITE_OTHER): Payer: Medicare Other | Admitting: *Deleted

## 2015-11-12 DIAGNOSIS — E538 Deficiency of other specified B group vitamins: Secondary | ICD-10-CM | POA: Diagnosis not present

## 2015-11-12 MED ORDER — CYANOCOBALAMIN 1000 MCG/ML IJ SOLN
1000.0000 ug | Freq: Once | INTRAMUSCULAR | Status: AC
  Administered 2015-11-12: 1000 ug via INTRAMUSCULAR

## 2015-11-17 ENCOUNTER — Telehealth: Payer: Self-pay | Admitting: *Deleted

## 2015-11-17 ENCOUNTER — Other Ambulatory Visit: Payer: Self-pay | Admitting: *Deleted

## 2015-11-17 DIAGNOSIS — G35 Multiple sclerosis: Secondary | ICD-10-CM

## 2015-11-17 DIAGNOSIS — Z79899 Other long term (current) drug therapy: Secondary | ICD-10-CM

## 2015-11-17 NOTE — Telephone Encounter (Signed)
I have spoken with Gabriel Hanson  this morning and advised it is time to repeat labwork.  He verbalized understanding of same, will probably come in on Friday.  Orders in EPIC/fim

## 2015-11-17 NOTE — Telephone Encounter (Signed)
-----   Message from Richard A Sater, MD sent at 09/17/2015 12:24 PM EST ----- Lymphocyte counts are lower than we would like to see while on Tecfidera. If low low long-term, this can increase the risk of certain types of severe infections. Therefore, I would like to recheck the CBC with differential in 2 months. If lymphocyte count has not increased, we will need to consider a different medicine. 

## 2015-11-17 NOTE — Telephone Encounter (Signed)
-----   Message from Asa Lente, MD sent at 09/17/2015 12:24 PM EST ----- Lymphocyte counts are lower than we would like to see while on Tecfidera. If low low long-term, this can increase the risk of certain types of severe infections. Therefore, I would like to recheck the CBC with differential in 2 months. If lymphocyte count has not increased, we will need to consider a different medicine.

## 2015-11-17 NOTE — Telephone Encounter (Signed)
I have spoken with Gabriel Hanson today and reminded him it is time to repeat cbc.  He verbalized understanding of same, sts. will probably be in tomorrow or Friday am for this.  Order in epic/fim

## 2015-11-18 ENCOUNTER — Other Ambulatory Visit (INDEPENDENT_AMBULATORY_CARE_PROVIDER_SITE_OTHER): Payer: Self-pay

## 2015-11-18 DIAGNOSIS — Z79899 Other long term (current) drug therapy: Secondary | ICD-10-CM | POA: Diagnosis not present

## 2015-11-18 DIAGNOSIS — G35 Multiple sclerosis: Secondary | ICD-10-CM | POA: Diagnosis not present

## 2015-11-18 DIAGNOSIS — Z0289 Encounter for other administrative examinations: Secondary | ICD-10-CM

## 2015-11-19 LAB — CBC WITH DIFFERENTIAL/PLATELET
Basophils Absolute: 0.1 10*3/uL (ref 0.0–0.2)
Basos: 1 %
EOS (ABSOLUTE): 0.1 10*3/uL (ref 0.0–0.4)
Eos: 2 %
HEMATOCRIT: 43.6 % (ref 37.5–51.0)
HEMOGLOBIN: 15 g/dL (ref 12.6–17.7)
IMMATURE GRANS (ABS): 0 10*3/uL (ref 0.0–0.1)
Immature Granulocytes: 0 %
LYMPHS ABS: 0.7 10*3/uL (ref 0.7–3.1)
Lymphs: 17 %
MCH: 32.1 pg (ref 26.6–33.0)
MCHC: 34.4 g/dL (ref 31.5–35.7)
MCV: 93 fL (ref 79–97)
MONOS ABS: 0.5 10*3/uL (ref 0.1–0.9)
Monocytes: 11 %
NEUTROS ABS: 2.8 10*3/uL (ref 1.4–7.0)
Neutrophils: 69 %
Platelets: 221 10*3/uL (ref 150–379)
RBC: 4.67 x10E6/uL (ref 4.14–5.80)
RDW: 14 % (ref 12.3–15.4)
WBC: 4.2 10*3/uL (ref 3.4–10.8)

## 2015-11-22 NOTE — Telephone Encounter (Signed)
PC with Nina and per RAS, I have advised labwork looked good--better than last time.  He verbalized understanding of same/fim

## 2015-11-22 NOTE — Telephone Encounter (Signed)
-----   Message from Asa Lente, MD sent at 11/19/2015  9:23 PM EST ----- Please let him know that the blood count looks good. It was better than last time.

## 2015-12-13 ENCOUNTER — Ambulatory Visit (INDEPENDENT_AMBULATORY_CARE_PROVIDER_SITE_OTHER): Payer: Medicare Other | Admitting: *Deleted

## 2015-12-13 ENCOUNTER — Encounter: Payer: Self-pay | Admitting: *Deleted

## 2015-12-13 VITALS — BP 116/74 | HR 68 | Resp 16 | Ht 73.0 in | Wt 192.0 lb

## 2015-12-13 DIAGNOSIS — E538 Deficiency of other specified B group vitamins: Secondary | ICD-10-CM

## 2015-12-13 MED ORDER — CYANOCOBALAMIN 1000 MCG/ML IJ SOLN
1000.0000 ug | Freq: Once | INTRAMUSCULAR | Status: AC
  Administered 2015-12-13: 1000 ug via INTRAMUSCULAR

## 2015-12-16 DIAGNOSIS — S42295D Other nondisplaced fracture of upper end of left humerus, subsequent encounter for fracture with routine healing: Secondary | ICD-10-CM | POA: Diagnosis not present

## 2016-01-17 ENCOUNTER — Ambulatory Visit (INDEPENDENT_AMBULATORY_CARE_PROVIDER_SITE_OTHER): Payer: Medicare Other | Admitting: *Deleted

## 2016-01-17 DIAGNOSIS — E538 Deficiency of other specified B group vitamins: Secondary | ICD-10-CM

## 2016-01-17 MED ORDER — CYANOCOBALAMIN 1000 MCG/ML IJ SOLN
1000.0000 ug | Freq: Once | INTRAMUSCULAR | Status: AC
  Administered 2016-01-17: 1000 ug via INTRAMUSCULAR

## 2016-01-17 NOTE — Progress Notes (Signed)
Pt here for B 12 injection.  Under aseptic technique cyanocobalamin 1000mcg/1ml IM given R deltoid.  Tolerated well.  Bandaid applied.  

## 2016-01-17 NOTE — Patient Instructions (Signed)
See next month.  Pt stated has not been taking tecfidera for 3 months.  He spoke to pharmacy once and then has not spoken to them and has not gotten medication.  Relayed to [employer], Charity fundraiser

## 2016-02-14 ENCOUNTER — Encounter: Payer: Self-pay | Admitting: Neurology

## 2016-02-14 ENCOUNTER — Ambulatory Visit (INDEPENDENT_AMBULATORY_CARE_PROVIDER_SITE_OTHER): Payer: Medicare Other | Admitting: Neurology

## 2016-02-14 VITALS — BP 118/82 | HR 66 | Resp 14 | Ht 73.0 in | Wt 182.5 lb

## 2016-02-14 DIAGNOSIS — R3911 Hesitancy of micturition: Secondary | ICD-10-CM | POA: Diagnosis not present

## 2016-02-14 DIAGNOSIS — F09 Unspecified mental disorder due to known physiological condition: Secondary | ICD-10-CM | POA: Diagnosis not present

## 2016-02-14 DIAGNOSIS — R269 Unspecified abnormalities of gait and mobility: Secondary | ICD-10-CM | POA: Diagnosis not present

## 2016-02-14 DIAGNOSIS — G35 Multiple sclerosis: Secondary | ICD-10-CM

## 2016-02-14 DIAGNOSIS — E538 Deficiency of other specified B group vitamins: Secondary | ICD-10-CM

## 2016-02-14 DIAGNOSIS — Z79899 Other long term (current) drug therapy: Secondary | ICD-10-CM | POA: Diagnosis not present

## 2016-02-14 DIAGNOSIS — F32A Depression, unspecified: Secondary | ICD-10-CM

## 2016-02-14 DIAGNOSIS — F329 Major depressive disorder, single episode, unspecified: Secondary | ICD-10-CM

## 2016-02-14 MED ORDER — METHYLPHENIDATE HCL ER 20 MG PO TBCR: 20.0000 mg | EXTENDED_RELEASE_TABLET | Freq: Every day | ORAL | Status: DC

## 2016-02-14 NOTE — Progress Notes (Signed)
[facility]  PATIENT: Gabriel Hanson DOB: May 30, 1951  REFERRING DOCTOR OR PCP:  None  SOURCE: patient  _________________________________   HISTORICAL  CHIEF COMPLAINT:  Chief Complaint  Patient presents with  . Multiple Sclerosis    HISTORY OF PRESENT ILLNESS:  Gabriel Hanson is a 65 yo man with MS.     He has been on Tecfidera since 2015.   Compliance was good initially but he has had problems getting the medication and he has been without for several months  Gait/strength/sensation:   He feels gait is clumsy but walks without support.    He is walking 5 miles a day.     He stumbles but never falls.    He denies weakness in limbs.   He has numbness in his feet and ankles.   There is more tingling and allodynia in his feet.     Vision:   He had right ON in past. He has had some reduced VF on the right  He feels vision is better on B12.   Colors seem ok.    He had an episode of diplopia x 2 weeks many years ago and used an eye patch while driving.  No recent diplopia or eye pain now.    Bladder/bowel:   He had urinary hesitancy and frequency helped some by tamsulosin (still has frequency).    He denies UTI's.   He has constipation with painful BM's only 1 -2 times a week. MiraLAX did not help much.  Fatigue/Attention/Sleep:   He reports physical and mental fatigue.   He had ADD as a child and has had attentional problems.   He does bette rwith Ritalin but since starting B12 he feels he does not need it as much.    He sleeps well most nights having insomnia rarely.     He remains active and walks daily.     Mood:   He is noting depression and feels his mood swings easily.    He doesn't get out of the house much.   He has a history of a severe major depression and he had suicidal ideation once but never acted.   He notes he cries easily if he watches a sad show.  Occasionally, he might laugh if sad or cry if happy.   Cognition:   He has difficulties with attention  and processing speed.    He felt the ritalin helped his processing speed and ability to multi-task.    He plays Sudoku to stimulate hs mind some as well.     B12:   His B12 was low and he is receiving IM injections and feels it has helpd fatigue and vision.  .     MS History:  He was diagnosed with MS in 2001 when he presented with right visual loss.     He had an MRI worrisome for MS.   He was placed on IV steroids and vision got 80% better.    He was placed on an interferon first and then Copaxone because he had tolerability issues.     For two years, he was without insurance and stopped all DMT.   I started seeing him about a year ago and we started Tecfidera.   He tolerates it well but sometimes forgets the second pill because all his medications are qAM.  MRI brain 2011 shows white matter foci in the periventricular and juxtacortical white matter consistent with the clinical diagnosis of MS. The right optic  nerve was reduced in size. There w was one focus in the right pons and one in the left cerebellar hemisphere, as well. None of the foci were enhancing during that MRI.    REVIEW OF SYSTEMS: Constitutional: No fevers, chills, sweats, or change in appetite Eyes: No visual changes, double vision, eye pain Ear, nose and throat: No hearing loss, ear pain, nasal congestion, sore throat Cardiovascular: No chest pain, palpitations Respiratory: No shortness of breath at rest or with exertion.   No wheezes GastrointestinaI: No nausea, vomiting, diarrhea, abdominal pain, fecal incontinence.  Severe constipation Genitourinary: as above.  Musculoskeletal: No neck pain, back pain Integumentary: No rash, pruritus, skin lesions Neurological: as above Psychiatric: Depression.  Some tearfulness inappropriately.  Cognitive decline.  No anxiety Endocrine: No palpitations, diaphoresis, change in appetite, change in weigh or increased thirst Hematologic/Lymphatic: No anemia, purpura,  petechiae. Allergic/Immunologic: No itchy/runny eyes, nasal congestion, recent allergic reactions, rashes  ALLERGIES: No Known Allergies  HOME MEDICATIONS:  Current outpatient prescriptions:  .  buPROPion (WELLBUTRIN XL) 150 MG 24 hr tablet, Take 1 tablet (150 mg total) by mouth daily., Disp: 30 tablet, Rfl: 11 .  naproxen sodium (ANAPROX) 220 MG tablet, Take 220 mg by mouth 2 (two) times daily with a meal., Disp: , Rfl:  .  tamsulosin (FLOMAX) 0.4 MG CAPS capsule, Take 1 capsule (0.4 mg total) by mouth daily., Disp: 30 capsule, Rfl: 11 .  Dimethyl Fumarate 240 MG CPDR, Take 1 capsule (240 mg total) by mouth 2 (two) times daily. (Patient not taking: Reported on 02/14/2016), Disp: 60 capsule, Rfl: 11 .  methylphenidate (CONCERTA) 36 MG PO CR tablet, Take 1 tablet (36 mg total) by mouth daily. (Patient not taking: Reported on 09/16/2015), Disp: 30 tablet, Rfl: 0 .  Multiple Vitamin (MULTIVITAMIN WITH MINERALS) TABS, Take 1 tablet by mouth daily. Reported on 02/14/2016, Disp: , Rfl:  .  sodium chloride (OCEAN) 0.65 % nasal spray, Place 1 spray into the nose daily as needed. Reported on 02/14/2016, Disp: , Rfl:   PAST MEDICAL HISTORY: Past Medical History  Diagnosis Date  . Multiple sclerosis (HCC)   . 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 Depressive disorder, not elsewhere classified   . Other psoriasis   . Psychosexual dysfunction, unspecified   . Disorder of eye, unspecified   . Memory loss     PAST SURGICAL HISTORY: Past Surgical History  Procedure Laterality Date  . Tonsillectomy    . Knee arthroscopy      right     FAMILY HISTORY: Family History  Problem Relation Age of Onset  . Breast cancer Mother   . Heart disease    . COPD Father     SOCIAL HISTORY:  Social History   Social History  . Marital Status: Single    Spouse Name: N/A  . Number of Children: N/A  . Years of Education: N/A   Occupational History  . [employer]    Fulltime   Social History Main Topics  . Smoking status: Former Smoker    Quit date: 01/09/2015  . Smokeless tobacco: Not on file  . Alcohol Use: 0.0 oz/week    0 Standard drinks or equivalent per week     Comment: Occasionally/fim  . Drug Use: Yes    Special: Marijuana  . Sexual Activity: Not on file   Other Topics Concern  . Not on file   Social History Narrative     PHYSICAL EXAM  Filed Vitals:   02/14/16 1046  BP: 118/82  Pulse: 66  Resp: 14  Height: 6\' 1"  (1.854 m)  Weight: 182 lb 8 oz (82.781 kg)    Body mass index is 24.08 kg/(m^2).   General: The patient is well-developed and well-nourished and in no acute distress  Neurologic Exam  Mental status: The patient is alert and oriented x 3 at the time of the examination. The patient has apparent normal recent and remote memory, with an apparently normal  concentration ability but some distractibility.   Speech is normal.  Cranial nerves: Extraocular movements are full. Pupils show 1+ right APD  color desaturated on right.  Visual fields are full.   There is good facial sensation to soft touch bilaterally.Facial strength is normal.  Trapezius and sternocleidomastoid strength is normal. No dysarthria is noted.  The tongue is midline, and the patient has symmetric elevation of the soft palate. No obvious hearing deficits are noted.  Motor:  Muscle bulk is normal.   Tone is normal. Strength is  5 / 5 in all 4 extremities.   Sensory: Sensory testing is intact to pinprick, soft touch and vibration sensation in arms but decreased vibration at toes and ankle, mildly worse on left  Coordination: Cerebellar testing reveals good finger-nose-finger and slightly reduced heel-to-shin bilaterally.  Gait and station: Station is normal.   Gait is normal. Tandem gait is wide. Romberg is negative.   Reflexes: Deep tendon reflexes are symmetric and 3 + arms and spread at knees, trace DTRs at ankles .        DIAGNOSTIC DATA (LABS, IMAGING, TESTING) - I reviewed patient records, labs, notes, testing and imaging myself where available.  Lab Results  Component Value Date   WBC 4.2 11/18/2015   HGB 15.1 01/15/2015   HCT 43.6 11/18/2015   MCV 93 11/18/2015   PLT 221 11/18/2015      Component Value Date/Time   NA 140 09/02/2012 1539   K 3.9 09/02/2012 1539   CL 102 09/02/2012 1539   CO2 30 09/02/2012 1539   GLUCOSE 115* 09/02/2012 1539   BUN 12 09/02/2012 1539   CREATININE 0.84 09/02/2012 1539   CALCIUM 9.5 09/02/2012 1539   GFRNONAA >90 09/02/2012 1539   GFRAA >90 09/02/2012 1539       ASSESSMENT AND PLAN  MULTIPLE SCLEROSIS  Gait disturbance  High risk medication use  Urinary hesitancy  Depression  Cognitive dysfunction   1.  Change to Tysabri as he has had difficulty with Tecfidera pills.  . We will check CBC with differential to make sure that there is not a lymphopenia and JCV Ab status.    Check MRI brain to make sure there is no subclinical progression.      We will get his old MRI from [facility] for comparison. 2.   Continue tamsulosin.   Continue B12 3.  Stay active    Continue to walk daily 4.  Ritalin 5.  If numbness worsens and B12 ok, consider getting NCV/EMG to make sure there is not a superimposed neuropathy He will return to see me in 6 months or sooner if he has new or worsening neurologic symptoms.   Richard A. Epimenio Foot, MD, PhD 02/14/2016, 10:55 AM Certified in Neurology, Clinical Neurophysiology, Sleep Medicine, Pain Medicine and Neuroimaging  [facility] [address] [place], [place] [address] [phone]      MS History

## 2016-02-15 LAB — CBC WITH DIFFERENTIAL/PLATELET
BASOS: 2 %
Basophils Absolute: 0.1 10*3/uL (ref 0.0–0.2)
EOS (ABSOLUTE): 0.2 10*3/uL (ref 0.0–0.4)
EOS: 4 %
HEMATOCRIT: 43 % (ref 37.5–51.0)
Hemoglobin: 14 g/dL (ref 12.6–17.7)
IMMATURE GRANS (ABS): 0 10*3/uL (ref 0.0–0.1)
IMMATURE GRANULOCYTES: 0 %
LYMPHS: 16 %
Lymphocytes Absolute: 0.8 10*3/uL (ref 0.7–3.1)
MCH: 29.9 pg (ref 26.6–33.0)
MCHC: 32.6 g/dL (ref 31.5–35.7)
MCV: 92 fL (ref 79–97)
Monocytes Absolute: 0.5 10*3/uL (ref 0.1–0.9)
Monocytes: 10 %
NEUTROS PCT: 68 %
Neutrophils Absolute: 3.2 10*3/uL (ref 1.4–7.0)
Platelets: 212 10*3/uL (ref 150–379)
RBC: 4.68 x10E6/uL (ref 4.14–5.80)
RDW: 14.1 % (ref 12.3–15.4)
WBC: 4.8 10*3/uL (ref 3.4–10.8)

## 2016-02-15 LAB — VITAMIN B12: Vitamin B-12: >2000 pg/mL — ABNORMAL HIGH (ref 211–946)

## 2016-02-18 ENCOUNTER — Telehealth: Payer: Self-pay | Admitting: *Deleted

## 2016-02-18 NOTE — Telephone Encounter (Signed)
-----   Message from Asa Lente, MD sent at 02/15/2016 12:10 PM EDT ----- Vitamin B 12 is now high. We can cut back the shots to just one every 3 months.

## 2016-02-18 NOTE — Telephone Encounter (Signed)
I have spoken with Gabriel Hanson this morning and per RAS, advised that vit. B12 level is now high; can decrease inj. to every 3 mos.  He verbalized understanding of same/fim

## 2016-02-22 ENCOUNTER — Ambulatory Visit
Admission: RE | Admit: 2016-02-22 | Discharge: 2016-02-22 | Disposition: A | Discharge status: Home / Self Care | Payer: Medicare Other | Source: Ambulatory Visit | Attending: Neurology | Admitting: Neurology

## 2016-02-22 DIAGNOSIS — R269 Unspecified abnormalities of gait and mobility: Secondary | ICD-10-CM

## 2016-02-22 DIAGNOSIS — G35 Multiple sclerosis: Secondary | ICD-10-CM

## 2016-02-22 MED ORDER — GADOBENATE DIMEGLUMINE 529 MG/ML IV SOLN: 17.0000 mL | Freq: Once | INTRAVENOUS | Status: DC | PRN

## 2016-02-28 ENCOUNTER — Telehealth: Payer: Self-pay | Admitting: *Deleted

## 2016-02-28 NOTE — Telephone Encounter (Signed)
-----   Message from Asa Lente, MD sent at 02/25/2016  7:01 PM EDT ----- MRI does not show any brand-new MS lesions. There are a few more compared to 2011 but not too bad as he has been on Tecfidera only part of the time.

## 2016-02-28 NOTE — Telephone Encounter (Signed)
I have spoken with Gabriel Hanson this afternoon and per RAS, advised that MRI does not show any brand new lesions--that he has a few new lesions since 2011, but this is not bad since he was off of meds for a period of time, on Tecfidera only part of that time.  He verbalized understanding of same/fim

## 2016-03-09 ENCOUNTER — Telehealth: Payer: Self-pay | Admitting: Neurology

## 2016-03-09 ENCOUNTER — Telehealth: Payer: Self-pay | Admitting: *Deleted

## 2016-03-09 NOTE — Telephone Encounter (Signed)
Message For: [facility]                  Taken  1-JUN-17 at  9:14AM by [facility] ------------------------------------------------------------ Aurea Graff              CID [id number]  Patient SAME                 Pt's Dr Epimenio Foot        Area Code 336 Phone# [phone] * DOB DEC         RE RETURNING CALL; PLEASE C/B                                                                             Disp:Y/N N If Y = C/B If No Response In ============================================================

## 2016-03-09 NOTE — Telephone Encounter (Signed)
Duplicate/fim 

## 2016-03-09 NOTE — Telephone Encounter (Signed)
Pt is wondering about infusion appt. Please call back

## 2016-03-14 ENCOUNTER — Ambulatory Visit: Payer: Medicare Other

## 2016-03-16 NOTE — Telephone Encounter (Signed)
Was unable to reach Gabriel Hanson at the number provided and got a message that the number had been changed.  If Mr. Leopard calls back, please let him know that the test for the JCV antibody came back showing that he would have a higher than average risk of PML infection. Therefore, we may wish to consider a different medication. If I am unable to reach him before I leave for a week vacation please let him know I will call them back when I return or an appointment can be scheduled to see me.

## 2016-03-20 ENCOUNTER — Encounter: Payer: Self-pay | Admitting: *Deleted

## 2016-03-20 NOTE — Telephone Encounter (Signed)
I have also tried to contact Espn at the home # we have on file for him, but receive message that # has been changed.  Unable to contact letter mailed to his home address today/fim

## 2016-03-29 ENCOUNTER — Telehealth: Payer: Self-pay | Admitting: Neurology

## 2016-03-29 NOTE — Telephone Encounter (Signed)
[facility] [phone] called sts pt has contacted them to start tecfidera. She said provider will need to fill out a start form.

## 2016-03-30 NOTE — Telephone Encounter (Signed)
Gabriel Hanson is no longer taking Tecfidera.  If he does restart in at any time in the future, a new srf will be sent in/fim

## 2016-10-10 ENCOUNTER — Ambulatory Visit (INDEPENDENT_AMBULATORY_CARE_PROVIDER_SITE_OTHER): Payer: Medicare Other | Admitting: Family Medicine

## 2016-10-10 VITALS — BP 138/88 | HR 74 | Temp 97.7°F | Resp 16 | Ht 72.0 in | Wt 201.0 lb

## 2016-10-10 DIAGNOSIS — G35 Multiple sclerosis: Secondary | ICD-10-CM

## 2016-10-10 DIAGNOSIS — Z23 Encounter for immunization: Secondary | ICD-10-CM | POA: Diagnosis not present

## 2016-10-10 DIAGNOSIS — Z720 Tobacco use: Secondary | ICD-10-CM | POA: Diagnosis not present

## 2016-10-10 DIAGNOSIS — F339 Major depressive disorder, recurrent, unspecified: Secondary | ICD-10-CM

## 2016-10-10 DIAGNOSIS — K5900 Constipation, unspecified: Secondary | ICD-10-CM | POA: Diagnosis not present

## 2016-10-10 DIAGNOSIS — R350 Frequency of micturition: Secondary | ICD-10-CM

## 2016-10-10 LAB — POCT URINALYSIS DIP (MANUAL ENTRY)
BILIRUBIN UA: NEGATIVE
Bilirubin, UA: NEGATIVE
Blood, UA: NEGATIVE
Glucose, UA: NEGATIVE
LEUKOCYTES UA: NEGATIVE
NITRITE UA: NEGATIVE
PH UA: 7
PROTEIN UA: NEGATIVE
Spec Grav, UA: 1.02
UROBILINOGEN UA: 0.2

## 2016-10-10 LAB — POC MICROSCOPIC URINALYSIS (UMFC): Mucus: ABSENT

## 2016-10-10 MED ORDER — BUPROPION HCL ER (XL) 150 MG PO TB24: 150.0000 mg | ORAL_TABLET | Freq: Every day | ORAL | 0 refills | Status: DC

## 2016-10-10 NOTE — Patient Instructions (Addendum)
As we discussed, I am concerned about control and status of your multiple sclerosis and would recommend following up with a neurologist as soon as possible to discuss medication.  If unable to see Dr. Epimenio Foot for whatever reason, I am happy to refer you to another office. Please let me know if I can help in this regard.   I did refill the wellbutrin for depression.  If you have any new side effects or problems with that medicine - return right away to discuss that medicationfurther.   If you are having any new urinary difficulties, return here or other medical provider.  May need to restart the Flomax. Let me know if that is something you would like to restart.   Please follow up to discuss some of the concerns you wrote on the paper today.Return to the clinic or go to the nearest emergency room if any of your symptoms worsen or new symptoms occur.  Colace for stool softener, Miralax if needed up to once per day. See other information on constipation below. Return to the clinic or go to the nearest emergency room if any of your symptoms worsen or new symptoms occur.   I would recommend quitting smoking. [facility] offers smoking cessation clinics. Registration is required. To register call [phone] or register online at [email or web address].   Constipation, Adult Constipation is when a person has fewer bowel movements in a week than normal, has difficulty having a bowel movement, or has stools that are dry, hard, or larger than normal. Constipation may be caused by an underlying condition. It may become worse with age if a person takes certain medicines and does not take in enough fluids. Follow these instructions at home: Eating and drinking  Eat foods that have a lot of fiber, such as fresh fruits and vegetables, whole grains, and beans.  Limit foods that are high in fat, low in fiber, or overly processed, such as french fries, hamburgers, cookies, candies, and soda.  Drink enough fluid to  keep your urine clear or pale yellow. General instructions  Exercise regularly or as told by your health care provider.  Go to the restroom when you have the urge to go. Do not hold it in.  Take over-the-counter and prescription medicines only as told by your health care provider. These include any fiber supplements.  Practice pelvic floor retraining exercises, such as deep breathing while relaxing the lower abdomen and pelvic floor relaxation during bowel movements.  Watch your condition for any changes.  Keep all follow-up visits as told by your health care provider. This is important. Contact a health care provider if:  You have pain that gets worse.  You have a fever.  You do not have a bowel movement after 4 days.  You vomit.  You are not hungry.  You lose weight.  You are bleeding from the anus.  You have thin, pencil-like stools. Get help right away if:  You have a fever and your symptoms suddenly get worse.  You leak stool or have blood in your stool.  Your abdomen is bloated.  You have severe pain in your abdomen.  You feel dizzy or you faint. This information is not intended to replace advice given to you by your health care provider. Make sure you discuss any questions you have with your health care provider. Document Released: 06/23/2004 Document Revised: 04/14/2016 Document Reviewed: 03/15/2016 Elsevier Interactive Patient Education  2017 [employer].      IF you received an x-ray  today, you will receive an invoice from [facility]. Please contact [facility] at [phone] with questions or concerns regarding your invoice.   IF you received labwork today, you will receive an invoice from [facility]. Please contact [facility] at [phone] with questions or concerns regarding your invoice.   Our billing staff will not be able to assist you with questions regarding bills from these companies.  You will be contacted with the lab  results as soon as they are available. The fastest way to get your results is to activate your My Chart account. Instructions are located on the last page of this paperwork. If you have not heard from [place] regarding the results in 2 weeks, please contact this office.

## 2016-10-10 NOTE — Progress Notes (Signed)
Subjective:  By signing my name below, I, Stann Ore, attest that this documentation has been prepared under the direction and in the presence of Meredith Staggers, MD. Electronically Signed: Stann Ore, Scribe. 10/10/2016 , 1:16 PM .  Patient was seen in Room 8 .   Patient ID: Gabriel Hanson, male    DOB: 06-19-1951, 66 y.o.   MRN: [id number] Chief Complaint  Patient presents with  . Establish Care   HPI Gabriel Hanson is a 66 y.o. male Here to establish care. Patient states he is on disability and is unable to drive. He hasn't seen his neurologist since last visit in May. He hasn't had a PCP in years.   Multiple sclerosis Patient has history of multiple sclerosis, followed by Dr. Epimenio Foot with [facility]. His last visit was on May 8th, 2017 with plan to follow up in approximately 4 months. He had an MRI last year that did not show any brand new lesions. There were few new lesions since 2011, but apparently not an issue since he was off medication for a period of time. Plan for change to Tysabri. He is on Metadate ER 20mg  qd, dimethyl fumarate 240mg  bid, and wellbutrin 150mg  qd; these are prescribed by Dr. Epimenio Foot. He is also prescribed flomax 0.4mg  qd. He also has a history of psoriasis and depression.   He isn't followed up with Dr. Epimenio Foot anymore. When his MS medication, Tecfidera, ran out, he wasn't able to refill. He states he hasn't been contacted for follow up and became frustrated. He mentions taking ritalin for 30 days for fatigue. He had lost of vision in his right eye, and vision became blurry; he describes his mind being "as blurry as his right vision". However, when he's on ritalin, it helped clear his mind. His most worrying concern is when he has positional changes between sitting and standing, as it takes him a few seconds to gain control of his legs. He has trouble with short term memory.   Depression He isn't sure if he has depression, but notes his describes it as  "UMS - ugly mood swings". He is unsure the effects with wellbutrin, but remembers the name. He notes taking a medication for depression in the past that caused him "to feel drugged", so he stopped taking that medication; however, he doesn't recall the name of the medication. He denies recent SI or self-injury. He reports suicidal ideation about 20 years ago prior to MS, and contemplated suicide, but nothing since.   Urine Frequency He also mentions trouble emptying his bladder with urinary frequency. He isn't sure if flomax helped his symptoms. He's mostly still having frequency ongoing for a while.   Cough He's started smoking again recently, and believes cough and rhinorrhea started due to smoking. He has quit once a month, and it would last about 3~7 days.   Constipation He's been having constipation for some time, unsure how long. He's tried OTC medication from the drug store, but the medication only caused his bowel movement to become moist and really loose. He believes it was Metamucil powder. He would have a bowel movement about once a week. He has some anal bleeding due to the constipation.   Patient Active Problem List   Diagnosis Date Noted  . Numbness 05/17/2015  . Gait disturbance 05/17/2015  . High risk medication use 01/15/2015  . Urinary hesitancy 01/15/2015  . Cognitive dysfunction 03/18/2010  . Erectile dysfunction 03/18/2010  . Depression 03/18/2010  . MULTIPLE SCLEROSIS  03/18/2010  . UNSPECIFIED DISORDER OF EYE 03/18/2010  . Constipation 03/18/2010  . RECTAL BLEEDING 03/18/2010  . PSORIASIS 03/18/2010  . WEIGHT LOSS 03/18/2010   Past Medical History:  Diagnosis Date  . Depressive disorder, not elsewhere classified   . Disorder of eye, unspecified   . Memory loss   . Multiple sclerosis (HCC)   . 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 Other psoriasis   . Psychosexual dysfunction, unspecified    Past Surgical History:    Procedure Laterality Date  . KNEE ARTHROSCOPY     right   . TONSILLECTOMY     No Known Allergies Prior to Admission medications   Medication Sig Start Date End Date Taking? Authorizing Provider  Multiple Vitamin (MULTIVITAMIN WITH MINERALS) TABS Take 1 tablet by mouth daily. Reported on 02/14/2016   Yes Historical Provider, MD  naproxen sodium (ANAPROX) 220 MG tablet Take 220 mg by mouth 3 (three) times daily with meals.    Yes Historical Provider, MD  buPROPion (WELLBUTRIN XL) 150 MG 24 hr tablet Take 1 tablet (150 mg total) by mouth daily. Patient not taking: Reported on 10/10/2016 09/16/15   Asa Lente, MD  Dimethyl Fumarate 240 MG CPDR Take 1 capsule (240 mg total) by mouth 2 (two) times daily. Patient not taking: Reported on 10/10/2016 09/20/15   Asa Lente, MD  methylphenidate (METADATE ER) 20 MG ER tablet Take 1 tablet (20 mg total) by mouth daily. Patient not taking: Reported on 10/10/2016 02/14/16   Asa Lente, MD  sodium chloride (OCEAN) 0.65 % nasal spray Place 1 spray into the nose daily as needed. Reported on 02/14/2016    Historical Provider, MD  tamsulosin (FLOMAX) 0.4 MG CAPS capsule Take 1 capsule (0.4 mg total) by mouth daily. Patient not taking: Reported on 10/10/2016 09/16/15   Asa Lente, MD   Social History   Social History  . Marital status: Single    Spouse name: N/A  . Number of children: N/A  . Years of education: N/A   Occupational History  . [employer]    Fulltime   Social History Main Topics  . Smoking status: Heavy Tobacco Smoker    Last attempt to quit: 01/09/2015  . Smokeless tobacco: Never Used  . Alcohol use 0.0 oz/week     Comment: Occasionally/fim  . Drug use:     Types: Marijuana  . Sexual activity: Not on file   Other Topics Concern  . Not on file   Social History Narrative  . No narrative on file   Review of Systems  Constitutional: Positive for fatigue.  HENT: Positive for rhinorrhea.   Eyes: Positive  for visual disturbance.  Respiratory: Positive for cough.   Gastrointestinal: Positive for anal bleeding and constipation.  Genitourinary: Positive for frequency.  Neurological: Positive for dizziness and weakness.  Psychiatric/Behavioral: Positive for behavioral problems, confusion, decreased concentration and dysphoric mood. Negative for self-injury and suicidal ideas.       Objective:   Physical Exam  Constitutional: He is oriented to person, place, and time. He appears well-developed and well-nourished. No distress.  HENT:  Head: Normocephalic and atraumatic.  Eyes: EOM are normal. Pupils are equal, round, and reactive to light.  Neck: Neck supple.  Cardiovascular: Normal rate.   Pulmonary/Chest: Effort normal. No respiratory distress.  Abdominal: Soft. Bowel sounds are normal. He exhibits no distension. There is no tenderness.  Musculoskeletal: Normal range of motion.  Neurological: He  is alert and oriented to person, place, and time.  Skin: Skin is warm and dry.  Psychiatric: He has a normal mood and affect. His behavior is normal.  Nursing note and vitals reviewed.   Vitals:   10/10/16 1204  BP: 138/88  Pulse: 74  Resp: 16  Temp: 97.7 F (36.5 C)  TempSrc: Oral  SpO2: 98%  Weight: 201 lb (91.2 kg)  Height: 6' (1.829 m)   Results for orders placed or performed in visit on 10/10/16  PSA  Result Value Ref Range   Prostate Specific Ag, Serum 0.8 0.0 - 4.0 ng/mL  POCT urinalysis dipstick  Result Value Ref Range   Color, UA yellow yellow   Clarity, UA clear clear   Glucose, UA negative negative   Bilirubin, UA negative negative   Ketones, POC UA negative negative   Spec Grav, UA 1.020    Blood, UA negative negative   pH, UA 7.0    Protein Ur, POC negative negative   Urobilinogen, UA 0.2    Nitrite, UA Negative Negative   Leukocytes, UA Negative Negative  POCT Microscopic Urinalysis ([facility])  Result Value Ref Range   WBC,UR,HPF,POC None None WBC/hpf    RBC,UR,HPF,POC None None RBC/hpf   Bacteria None None, Too numerous to count   Mucus Absent Absent   Epithelial Cells, UR Per Microscopy None None, Too numerous to count cells/hpf       Assessment & Plan:    Gabriel Hanson is a 66 y.o. male Multiple sclerosis (HCC)  - Off medication, and without recent neurology eval. Recommended follow-up with neurologist, or to let me know if other referral needed.  Need for prophylactic vaccination and inoculation against influenza - Plan: Flu Vaccine QUAD 36+ mos IM  Urinary frequency - Plan: POCT urinalysis dipstick, PSA, POCT Microscopic Urinalysis ([facility])  - Flomax discussed, he declined medication at this time. RTC precautions given. PSA was normal, and no sign of infection on in office urinalysis.  Tobacco abuse  - Cessation discussed, resources provided.  Constipation, unspecified constipation type  - Fiber, water in diet, stool softener, MiraLAX discussed, handout given. If persistent, return to discuss other treatments or evaluation.  Episode of recurrent major depressive disorder, unspecified depression episode severity (HCC) - Plan: buPROPion (WELLBUTRIN XL) 150 MG 24 hr tablet  - Restart Wellbutrin 150 mg daily. Also advised for need to follow-up with neurologist as above. Follow-up in approximately one month to check status of depression.  Meds ordered this encounter  Medications  . buPROPion (WELLBUTRIN XL) 150 MG 24 hr tablet    Sig: Take 1 tablet (150 mg total) by mouth daily.    Dispense:  90 tablet    Refill:  0   Patient Instructions    As we discussed, I am concerned about control and status of your multiple sclerosis and would recommend following up with a neurologist as soon as possible to discuss medication.  If unable to see Dr. Epimenio Foot for whatever reason, I am happy to refer you to another office. Please let me know if I can help in this regard.   I did refill the wellbutrin for depression.  If you have any new side  effects or problems with that medicine - return right away to discuss that medicationfurther.   If you are having any new urinary difficulties, return here or other medical provider.  May need to restart the Flomax. Let me know if that is something you would like to restart.  Please follow up to discuss some of the concerns you wrote on the paper today.Return to the clinic or go to the nearest emergency room if any of your symptoms worsen or new symptoms occur.  Colace for stool softener, Miralax if needed up to once per day. See other information on constipation below. Return to the clinic or go to the nearest emergency room if any of your symptoms worsen or new symptoms occur.   I would recommend quitting smoking. [place] offers smoking cessation clinics. Registration is required. To register call [phone] or register online at [email or web address].   Constipation, Adult Constipation is when a person has fewer bowel movements in a week than normal, has difficulty having a bowel movement, or has stools that are dry, hard, or larger than normal. Constipation may be caused by an underlying condition. It may become worse with age if a person takes certain medicines and does not take in enough fluids. Follow these instructions at home: Eating and drinking  Eat foods that have a lot of fiber, such as fresh fruits and vegetables, whole grains, and beans.  Limit foods that are high in fat, low in fiber, or overly processed, such as french fries, hamburgers, cookies, candies, and soda.  Drink enough fluid to keep your urine clear or pale yellow. General instructions  Exercise regularly or as told by your health care provider.  Go to the restroom when you have the urge to go. Do not hold it in.  Take over-the-counter and prescription medicines only as told by your health care provider. These include any fiber supplements.  Practice pelvic floor retraining exercises, such as deep breathing  while relaxing the lower abdomen and pelvic floor relaxation during bowel movements.  Watch your condition for any changes.  Keep all follow-up visits as told by your health care provider. This is important. Contact a health care provider if:  You have pain that gets worse.  You have a fever.  You do not have a bowel movement after 4 days.  You vomit.  You are not hungry.  You lose weight.  You are bleeding from the anus.  You have thin, pencil-like stools. Get help right away if:  You have a fever and your symptoms suddenly get worse.  You leak stool or have blood in your stool.  Your abdomen is bloated.  You have severe pain in your abdomen.  You feel dizzy or you faint. This information is not intended to replace advice given to you by your health care provider. Make sure you discuss any questions you have with your health care provider. Document Released: 06/23/2004 Document Revised: 04/14/2016 Document Reviewed: 03/15/2016 Elsevier Interactive Patient Education  2017 [employer].      IF you received an x-ray today, you will receive an invoice from [facility]. Please contact [facility] at [phone] with questions or concerns regarding your invoice.   IF you received labwork today, you will receive an invoice from [facility]. Please contact [facility] at [phone] with questions or concerns regarding your invoice.   Our billing staff will not be able to assist you with questions regarding bills from these companies.  You will be contacted with the lab results as soon as they are available. The fastest way to get your results is to activate your My Chart account. Instructions are located on the last page of this paperwork. If you have not heard from [place] regarding the results in 2 weeks, please contact this office.  I personally performed the services described in this documentation, which was scribed in my presence. The recorded  information has been reviewed and considered, and addended by me as needed.   Signed,   Meredith Staggers, MD [facility].  10/12/16 12:20 PM

## 2016-10-11 LAB — PSA: Prostate Specific Ag, Serum: 0.8 ng/mL (ref 0.0–4.0)

## 2016-12-07 ENCOUNTER — Ambulatory Visit: Payer: Medicare Other | Admitting: Family Medicine

## 2017-01-14 ENCOUNTER — Other Ambulatory Visit: Payer: Self-pay | Admitting: Family Medicine

## 2017-01-14 DIAGNOSIS — F339 Major depressive disorder, recurrent, unspecified: Secondary | ICD-10-CM

## 2017-01-18 ENCOUNTER — Encounter: Payer: Self-pay | Admitting: Family Medicine

## 2017-01-18 ENCOUNTER — Ambulatory Visit (INDEPENDENT_AMBULATORY_CARE_PROVIDER_SITE_OTHER): Payer: Medicare Other | Admitting: Family Medicine

## 2017-01-18 VITALS — BP 136/83 | HR 81 | Temp 97.7°F | Resp 16 | Ht 72.0 in | Wt 210.0 lb

## 2017-01-18 DIAGNOSIS — F329 Major depressive disorder, single episode, unspecified: Secondary | ICD-10-CM

## 2017-01-18 DIAGNOSIS — R21 Rash and other nonspecific skin eruption: Secondary | ICD-10-CM

## 2017-01-18 DIAGNOSIS — F32A Depression, unspecified: Secondary | ICD-10-CM

## 2017-01-18 DIAGNOSIS — G35 Multiple sclerosis: Secondary | ICD-10-CM | POA: Diagnosis not present

## 2017-01-18 DIAGNOSIS — R350 Frequency of micturition: Secondary | ICD-10-CM | POA: Diagnosis not present

## 2017-01-18 DIAGNOSIS — G35D Multiple sclerosis, unspecified: Secondary | ICD-10-CM

## 2017-01-18 DIAGNOSIS — R32 Unspecified urinary incontinence: Secondary | ICD-10-CM | POA: Diagnosis not present

## 2017-01-18 DIAGNOSIS — B372 Candidiasis of skin and nail: Secondary | ICD-10-CM

## 2017-01-18 LAB — POCT URINALYSIS DIP (MANUAL ENTRY)
BILIRUBIN UA: NEGATIVE
BILIRUBIN UA: NEGATIVE mg/dL
Blood, UA: NEGATIVE
GLUCOSE UA: NEGATIVE mg/dL
Leukocytes, UA: NEGATIVE
Nitrite, UA: NEGATIVE
Protein Ur, POC: NEGATIVE mg/dL
SPEC GRAV UA: 1.015 (ref 1.010–1.025)
Urobilinogen, UA: 0.2 E.U./dL
pH, UA: 6 (ref 5.0–8.0)

## 2017-01-18 MED ORDER — TAMSULOSIN HCL 0.4 MG PO CAPS: 0.4000 mg | ORAL_CAPSULE | Freq: Every day | ORAL | 2 refills | Status: DC

## 2017-01-18 MED ORDER — CLOTRIMAZOLE 1 % EX CREA: 1.0000 "application " | TOPICAL_CREAM | Freq: Two times a day (BID) | CUTANEOUS | 1 refills | Status: DC

## 2017-01-18 NOTE — Progress Notes (Signed)
Subjective:  This chart was scribed for Gabriel Flood, MD by [name], at Primary Care at [facility].  This patient was seen in room 27 and the patient's care was started at 11:36 AM.   Chief Complaint  Patient presents with  . Follow-up    MS/ med check/ pt would like to get back on flomax     Patient ID: Gabriel Hanson, male    DOB: [birth year], 66 y.o.   MRN: [id number]  HPI HPI Comments: Gabriel Hanson is a 66 y.o. male who presents to Primary Care at [facility] for a follow up.   Multiple Sclerosis: Patient has a history of multiple sclerosis.  He was last seen January 2nd to establish care as he had not had a PCP in years. Previous neurologist is Dr. Epimenio Foot.  He had not followed up with Dr. Epimenio Foot when it was time for a med refill.  He was encouraged at last visit to schedule that follow up or let me know if a referral was needed. I did not see an appointment scheduled with a neurologist. ---- Patient has not yet scheduled his appointment with his neurologist. Patient states that it is getting harder and harder to walk.  He is currently not taking any medication for MS.  He declared bankruptcy a couple years ago so he had not gotten around to getting the care he needs.   Urinary frequency: see last visit. He has tried Flomax in the past and was not sure if it helped.  Option of restarting was given last time. He had a normal PSA and no sign of infection on testing in office in January. --- Patient has an increased need to urinate when he walks. He states that he has no bladder control and urinates on himself frequently. Denies dysuria. Patient states that the skin around his penis looks smoother than normal and red/shiny.  Patient does not wearing any incontinence underwear. Patient states that he also noticed a rash in between his buttocks and has a history of psoriasis.    Depression: see last visit: restarted Wellbutrin 150 mg QD.  ---- Patient states that he did not feel any  different after taking this medication and has not been taking it for the past 2 months.  He states that he cries much more often when watching television but denies actually feeling depressed.  He states that real life events don't really make him cry like watching television does. He is now retired so he has more time to actually watch TV.  Denies any thoughts or harming himself of suicide.  Patient denies any feelings of day to day depression and would not like to take any medication for it.  He states that his friends usually can tell changes with him better than he can. He is willing to discuss his crying with the neurologist as well.        Patient Active Problem List   Diagnosis Date Noted  . Numbness 05/17/2015  . Gait disturbance 05/17/2015  . High risk medication use 01/15/2015  . Urinary hesitancy 01/15/2015  . Cognitive dysfunction 03/18/2010  . Erectile dysfunction 03/18/2010  . Depression 03/18/2010  . MULTIPLE SCLEROSIS 03/18/2010  . UNSPECIFIED DISORDER OF EYE 03/18/2010  . Constipation 03/18/2010  . RECTAL BLEEDING 03/18/2010  . PSORIASIS 03/18/2010  . WEIGHT LOSS 03/18/2010   Past Medical History:  Diagnosis Date  . Depressive disorder, not elsewhere classified   . Disorder of eye, unspecified   .  Memory loss   . Multiple sclerosis (HCC)   . 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 Other psoriasis   . Psychosexual dysfunction, unspecified    Past Surgical History:  Procedure Laterality Date  . KNEE ARTHROSCOPY     right   . TONSILLECTOMY     No Known Allergies Prior to Admission medications   Medication Sig Start Date End Date Taking? Authorizing Provider  Multiple Vitamin (MULTIVITAMIN WITH MINERALS) TABS Take 1 tablet by mouth daily. Reported on 02/14/2016   Yes Historical Provider, MD  naproxen sodium (ANAPROX) 220 MG tablet Take 220 mg by mouth 3 (three) times daily with meals.    Yes Historical Provider, MD    buPROPion (WELLBUTRIN XL) 150 MG 24 hr tablet TAKE 1 TABLET BY MOUTH DAILY Patient not taking: Reported on 01/18/2017 01/15/17   Gabriel Flood, MD  Dimethyl Fumarate 240 MG CPDR Take 1 capsule (240 mg total) by mouth 2 (two) times daily. Patient not taking: Reported on 10/10/2016 09/20/15   Asa Lente, MD  methylphenidate (METADATE ER) 20 MG ER tablet Take 1 tablet (20 mg total) by mouth daily. Patient not taking: Reported on 10/10/2016 02/14/16   Asa Lente, MD  sodium chloride (OCEAN) 0.65 % nasal spray Place 1 spray into the nose daily as needed. Reported on 02/14/2016    Historical Provider, MD  tamsulosin (FLOMAX) 0.4 MG CAPS capsule Take 1 capsule (0.4 mg total) by mouth daily. Patient not taking: Reported on 10/10/2016 09/16/15   Asa Lente, MD   Social History   Social History  . Marital status: Single    Spouse name: N/A  . Number of children: N/A  . Years of education: N/A   Occupational History  . [employer]    Fulltime   Social History Main Topics  . Smoking status: Heavy Tobacco Smoker    Last attempt to quit: 01/09/2015  . Smokeless tobacco: Never Used  . Alcohol use 0.0 oz/week     Comment: Occasionally/fim  . Drug use: Yes    Types: Marijuana  . Sexual activity: Not on file   Other Topics Concern  . Not on file   Social History Narrative  . No narrative on file      Review of Systems  Constitutional: Negative for chills and fever.  Eyes: Negative for pain, redness and itching.  Respiratory: Negative for cough and shortness of breath.   Gastrointestinal: Negative for nausea and vomiting.  Genitourinary: Positive for frequency. Negative for dysuria.       Incontinence.       Objective:   Physical Exam  Constitutional: He is oriented to person, place, and time. He appears well-developed and well-nourished.  HENT:  Head: Normocephalic and atraumatic.  Eyes: EOM are normal. Pupils are equal, round, and reactive to light.   Neck: No JVD present. Carotid bruit is not present.  Cardiovascular: Normal rate, regular rhythm and normal heart sounds.   No murmur heard. Pulmonary/Chest: Effort normal and breath sounds normal. No respiratory distress. He has no rales.  Genitourinary:  Genitourinary Comments: Diffuse erythema at the base of the glands spreading to the surface of the glands and below the urethral opening. There is some spread of erythema to the scrotum centrally but not noted in the inguinal folds.  There is a similar erythematous rash in the cuneal cleft without open or raw lesions.  No discharge, no bleeding.   Musculoskeletal: He exhibits  no edema.  Neurological: He is alert and oriented to person, place, and time.  Skin: Skin is warm and dry.  Psychiatric: He has a normal mood and affect.  Vitals reviewed.   Vitals:   01/18/17 1046  BP: 136/83  Pulse: 81  Resp: 16  Temp: 97.7 F (36.5 C)  TempSrc: Oral  SpO2: 98%  Weight: 210 lb (95.3 kg)  Height: 6' (1.829 m)         Assessment & Plan:   LINC RENNE is a 67 y.o. male Multiple sclerosis (HCC)  - worsening symptoms by report. Stressed importance of follow up with neuro. If new referral need, can provide.   Depression, unspecified depression type  - min change with Wellbutrin.  May be a component of adjustment with MS symptoms. Recommend discussion with neuro.   Urinary frequency - Plan: tamsulosin (FLOMAX) 0.4 MG CAPS capsule, POCT urinalysis dipstick, Urine Microscopic Urinary incontinence, unspecified type - Plan: POCT urinalysis dipstick, Urine Microscopic  - may be related to MS as above. Follow up with neuro stressed. Restart Flomax, continue incontinence pads as needed. rtc precautions.   Rash of genital area - Plan: clotrimazole (LOTRIMIN) 1 % cream Candidal intertrigo - Plan: clotrimazole (LOTRIMIN) 1 % cream  - candida with possible dermatitis with incontinence above versus Psoriasis with reported history of same.   -  start with topical Lotrimin to affected areas. Recheck in 1 week, sooner if worse.   Meds ordered this encounter  Medications  . tamsulosin (FLOMAX) 0.4 MG CAPS capsule    Sig: Take 1 capsule (0.4 mg total) by mouth daily.    Dispense:  30 capsule    Refill:  2  . clotrimazole (LOTRIMIN) 1 % cream    Sig: Apply 1 application topically 2 (two) times daily. To genital rash and rash on buttocks.    Dispense:  60 g    Refill:  1   Patient Instructions    You may still have some depression, but as you did not notice any change in symptoms with Wellbutrin, we can hold on new meds for depression and it may be best to discuss that with your neurologist to see if other treatments may be needed. We do have other options if you would like to start a diferent antidepressant.   Again, it is important that you meet with neurology to discuss treatments for MS as I am concerned about possible progression recently.   For urinary symptoms, can try restarting Flomax to see if that helps.  The loss of bladder control may also be related to your MS.  Discuss this with neurology as well. I would recommend incontinence pads to keep the skin from getting irritated from urine.   Apply the antifungal cream to the areas of redness on your scrotum, penis, as well as between the buttock crease. Recheck in 1 week to evaluate that area further, sooner if any spread of redness, fevers, or new rashes start. If not improving with antifungal cream, may need consult with a dermatologist or possible treat for psoriasis as that can have similar appearance.  Return to the clinic or go to the nearest emergency room if any of your symptoms worsen or new symptoms occur.    IF you received an x-ray today, you will receive an invoice from [facility]. Please contact [facility] at [phone] with questions or concerns regarding your invoice.   IF you received labwork today, you will receive an invoice from  [place]. Please  contact [facility] at [phone] with questions or concerns regarding your invoice.   Our billing staff will not be able to assist you with questions regarding bills from these companies.  You will be contacted with the lab results as soon as they are available. The fastest way to get your results is to activate your My Chart account. Instructions are located on the last page of this paperwork. If you have not heard from [place] regarding the results in 2 weeks, please contact this office.       I personally performed the services described in this documentation, which was scribed in my presence. The recorded information has been reviewed and considered for accuracy and completeness, addended by me as needed, and agree with information above.  Signed,   Meredith Staggers, MD Primary Care at [facility].  01/20/17 10:34 PM

## 2017-01-18 NOTE — Patient Instructions (Addendum)
  You may still have some depression, but as you did not notice any change in symptoms with Wellbutrin, we can hold on new meds for depression and it may be best to discuss that with your neurologist to see if other treatments may be needed. We do have other options if you would like to start a diferent antidepressant.   Again, it is important that you meet with neurology to discuss treatments for MS as I am concerned about possible progression recently.   For urinary symptoms, can try restarting Flomax to see if that helps.  The loss of bladder control may also be related to your MS.  Discuss this with neurology as well. I would recommend incontinence pads to keep the skin from getting irritated from urine.   Apply the antifungal cream to the areas of redness on your scrotum, penis, as well as between the buttock crease. Recheck in 1 week to evaluate that area further, sooner if any spread of redness, fevers, or new rashes start. If not improving with antifungal cream, may need consult with a dermatologist or possible treat for psoriasis as that can have similar appearance.  Return to the clinic or go to the nearest emergency room if any of your symptoms worsen or new symptoms occur.    IF you received an x-ray today, you will receive an invoice from [facility]. Please contact [facility] at [phone] with questions or concerns regarding your invoice.   IF you received labwork today, you will receive an invoice from [facility]. Please contact [facility] at [phone] with questions or concerns regarding your invoice.   Our billing staff will not be able to assist you with questions regarding bills from these companies.  You will be contacted with the lab results as soon as they are available. The fastest way to get your results is to activate your My Chart account. Instructions are located on the last page of this paperwork. If you have not heard from [place] regarding the results  in 2 weeks, please contact this office.

## 2017-01-19 LAB — URINALYSIS, MICROSCOPIC ONLY
BACTERIA UA: NONE SEEN
Casts: NONE SEEN /lpf
WBC UA: NONE SEEN /HPF (ref 0–?)

## 2017-04-10 ENCOUNTER — Telehealth: Payer: Self-pay | Admitting: *Deleted

## 2017-04-10 ENCOUNTER — Encounter: Payer: Self-pay | Admitting: Neurology

## 2017-04-10 ENCOUNTER — Ambulatory Visit (INDEPENDENT_AMBULATORY_CARE_PROVIDER_SITE_OTHER): Payer: Medicare Other | Admitting: Neurology

## 2017-04-10 VITALS — BP 128/81 | HR 68 | Resp 18 | Ht 72.0 in | Wt 204.5 lb

## 2017-04-10 DIAGNOSIS — R350 Frequency of micturition: Secondary | ICD-10-CM | POA: Diagnosis not present

## 2017-04-10 DIAGNOSIS — R269 Unspecified abnormalities of gait and mobility: Secondary | ICD-10-CM

## 2017-04-10 DIAGNOSIS — G35 Multiple sclerosis: Secondary | ICD-10-CM

## 2017-04-10 DIAGNOSIS — R200 Anesthesia of skin: Secondary | ICD-10-CM

## 2017-04-10 DIAGNOSIS — Z79899 Other long term (current) drug therapy: Secondary | ICD-10-CM | POA: Diagnosis not present

## 2017-04-10 DIAGNOSIS — F09 Unspecified mental disorder due to known physiological condition: Secondary | ICD-10-CM

## 2017-04-10 DIAGNOSIS — R3911 Hesitancy of micturition: Secondary | ICD-10-CM | POA: Diagnosis not present

## 2017-04-10 MED ORDER — METHYLPHENIDATE HCL 10 MG PO TABS: ORAL_TABLET | ORAL | 0 refills | Status: DC

## 2017-04-10 MED ORDER — TAMSULOSIN HCL 0.4 MG PO CAPS: 0.4000 mg | ORAL_CAPSULE | Freq: Every day | ORAL | 11 refills | Status: DC

## 2017-04-10 NOTE — Progress Notes (Signed)
[facility]  PATIENT: Gabriel Hanson DOB: 02-09-1951  REFERRING DOCTOR OR PCP:  None  SOURCE: patient  _________________________________   HISTORICAL  CHIEF COMPLAINT:  Chief Complaint  Patient presents with  . Multiple Sclerosis    Sts. he stopped Tecfidera but isn't sure when. Thinks he is having more trouble walking, due to knee problems/fim    HISTORY OF PRESENT ILLNESS:  Gabriel Hanson is a 66 yo man with MS.       MS:    He was placed on Tecfidera are in 2015. Initially, he had good compliance but then in 2017 compliance was very poor and we considered a switch to Tysabri. However, the JCV Ab was middle positive at 0.99 and he did not start.   He would like to get back on Tecfidera.   Gait/strength/sensation:   He continues to note some difficulty with his gait. It is clumsy and he feels the left leg gives out at times. He was walking 5 miles a day but more recently is having difficulty walking 1 mile.  He also has some tingling in the feet bilaterally.   The arms have normal strength and sensation.    Vision:  He is noting more difficulty with his vision. He had optic neuritis on the right in the past and that eye has never been as strong as the left. However, more recently, the left eye also is having more difficulty.    Colors seem ok.    He had an episode of diplopia x 2 weeks many years ago and used an eye patch while driving.  No recent diplopia or eye pain now.    Bladder/bowel:   Urinary hesitancy was improved by tamsulosin. He noted a big difference when he went off the medication for a few days..   He has constipation with painful BM's only 1 -2 times a week. MiraLAX did not help at first then caused diarrhea.     Fatigue/Attention/Sleep:   He reports physical and mental fatigue.   He had ADD as a child and has had attentional problems.   He felt Ritalin helped his mental fog and attention more than his fatigue.     He sleeps well most nights  having insomnia rarely.     He remains active and walks daily.     Mood:   He notes mild depression and he has a history of a severe major depression. He does not have any suicidal ideation at this time though he did in the past..   Occasionally, he might laugh if sad or cry if happy and is also had some tearful episodes  Cognition:   He has had difficulties with focus and attention. Ritalin has helped but he ran out. He felt he was able to get much more complex during the day.       B12:   His B12 was low and he is receiving IM injections and feels it has helpd fatigue and vision.  .   When last checked 02/14/2016, B12 was no longer low.  Knee pain:   He has had right knee pain due to a torn meniscus and now notes similar pain on his left.    MS History:  He was diagnosed with MS in 2001 when he presented with right visual loss.     He had an MRI worrisome for MS.   He was placed on IV steroids and vision got 80% better.    He was placed  on an interferon first and then Copaxone because he had tolerability issues.     For two years, he was without insurance and stopped all DMT.   I started seeing him in 2015 and we started Tecfidera.   He tolerates it well but sometimes forgets the second pill because all his medications are qAM.  MRI brain 2011 shows white matter foci in the periventricular and juxtacortical white matter consistent with the clinical diagnosis of MS. The right optic nerve was reduced in size. There w was one focus in the right pons and one in the left cerebellar hemisphere, as well. None of the foci were enhancing during that MRI.    REVIEW OF SYSTEMS: Constitutional: No fevers, chills, sweats, or change in appetite Eyes: No visual changes, double vision, eye pain Ear, nose and throat: No hearing loss, ear pain, nasal congestion, sore throat Cardiovascular: No chest pain, palpitations Respiratory: No shortness of breath at rest or with exertion.   No wheezes GastrointestinaI: No  nausea, vomiting, diarrhea, abdominal pain, fecal incontinence.  Severe constipation Genitourinary: as above.  Musculoskeletal: No neck pain, back pain Integumentary: No rash, pruritus, skin lesions Neurological: as above Psychiatric: Depression.  Some tearfulness inappropriately.  Cognitive decline.  No anxiety Endocrine: No palpitations, diaphoresis, change in appetite, change in weigh or increased thirst Hematologic/Lymphatic: No anemia, purpura, petechiae. Allergic/Immunologic: No itchy/runny eyes, nasal congestion, recent allergic reactions, rashes  ALLERGIES: No Known Allergies  HOME MEDICATIONS:  Current Outpatient Prescriptions:  .  tamsulosin (FLOMAX) 0.4 MG CAPS capsule, Take 1 capsule (0.4 mg total) by mouth daily., Disp: 30 capsule, Rfl: 11 .  Dimethyl Fumarate 120 & 240 MG MISC, Take by mouth., Disp: , Rfl:  .  Dimethyl Fumarate 240 MG CPDR, Take 240 mg by mouth 2 (two) times daily., Disp: , Rfl:  .  methylphenidate (RITALIN) 10 MG tablet, Take up to three pills a day, Disp: 90 tablet, Rfl: 0  PAST MEDICAL HISTORY: Past Medical History:  Diagnosis Date  . Depressive disorder, not elsewhere classified   . Disorder of eye, unspecified   . Memory loss   . Multiple sclerosis (HCC)   . 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 Other psoriasis   . Psychosexual dysfunction, unspecified     PAST SURGICAL HISTORY: Past Surgical History:  Procedure Laterality Date  . KNEE ARTHROSCOPY     right   . TONSILLECTOMY      FAMILY HISTORY: Family History  Problem Relation Age of Onset  . Breast cancer Mother   . COPD Father   . Heart disease Unknown     SOCIAL HISTORY:  Social History   Social History  . Marital status: Single    Spouse name: N/A  . Number of children: N/A  . Years of education: N/A   Occupational History  . [employer]    Fulltime   Social History Main Topics  . Smoking status: Heavy  Tobacco Smoker    Last attempt to quit: 01/09/2015  . Smokeless tobacco: Never Used  . Alcohol use 0.0 oz/week     Comment: Occasionally/fim  . Drug use: Yes    Types: Marijuana  . Sexual activity: Not on file   Other Topics Concern  . Not on file   Social History Narrative  . No narrative on file     PHYSICAL EXAM  Vitals:   04/10/17 0934  BP: 128/81  Pulse: 68  Resp: 18  Weight: 204 lb  8 oz (92.8 kg)  Height: 6' (1.829 m)    Body mass index is 27.74 kg/m.   General: The patient is well-developed and well-nourished and in no acute distress  Neurologic Exam  Mental status: The patient is alert and oriented x 3 at the time of the examination. The patient has apparent normal recent and remote memory, with an apparently normal  concentration ability but some distractibility.   Speech is normal.  Cranial nerves: Extraocular movements are full. Pupils show 1+ APD on the right.   Colors mildly desaturated OD  Facial strength and sensation.  Trapezius and sternocleidomastoid strength is normal. No dysarthria is noted.  The tongue is midline, and the patient has symmetric elevation of the soft palate. No obvious hearing deficits are noted.  Motor:  Muscle bulk is normal.   Tone is normal. Strength is  5 / 5 in all 4 extremities.   Sensory: Sensation is normal in his arms but reduced in the left foot to touch  Coordination: Cerebellar testing reveals good finger-nose-finger and slightly reduced heel-to-shin bilaterally.  Gait and station: Station is normal.   Gait is normal. Tandem gait is very wide. Romberg is negative.   Reflexes: Deep tendon reflexes are symmetric and 3 + arms and spread at knees, trace DTRs at ankles .       DIAGNOSTIC DATA (LABS, IMAGING, TESTING) - I reviewed patient records, labs, notes, testing and imaging myself where available.  Lab Results  Component Value Date   WBC 4.8 02/14/2016   HGB 14.0 02/14/2016   HCT 43.0 02/14/2016   MCV 92  02/14/2016   PLT 212 02/14/2016      Component Value Date/Time   NA 140 09/02/2012 1539   K 3.9 09/02/2012 1539   CL 102 09/02/2012 1539   CO2 30 09/02/2012 1539   GLUCOSE 115 (H) 09/02/2012 1539   BUN 12 09/02/2012 1539   CREATININE 0.84 09/02/2012 1539   CALCIUM 9.5 09/02/2012 1539   GFRNONAA >90 09/02/2012 1539   GFRAA >90 09/02/2012 1539       ASSESSMENT AND PLAN  MULTIPLE SCLEROSIS - Plan: CBC with Differential/Platelet, Hepatic function panel  Urinary frequency - Plan: tamsulosin (FLOMAX) 0.4 MG CAPS capsule  Cognitive dysfunction  Gait disturbance  High risk medication use - Plan: CBC with Differential/Platelet, Hepatic function panel  Urinary hesitancy  Numbness  1.  We discussed disease modifying therapies. He would like to get back on Taxotere and he signed the service request form. We will send this in. CBC and LFTs will be checked.  2.   Continue tamsulosin.   Continue B12 3.  He is advised to stay active and exercises as tolerated. 4.  Ritalin 10 mg up to 3 times a day.   He could have pseudobulbar affect and I would consider adding Neudexta if this worsens. 5.   He will return to see me in 6 months or sooner if he has new or worsening neurologic symptoms.  40 minutes face-to-face evaluation with greater than one half the time counseling and coordinating care about his MS, treatments and symptoms.  Richard A. Epimenio Foot, MD, PhD 04/10/2017, 12:59 PM Certified in Neurology, Clinical Neurophysiology, Sleep Medicine, Pain Medicine and Neuroimaging  [facility] [address] [place], [place] [address] [phone]      MS History

## 2017-04-10 NOTE — Telephone Encounter (Signed)
Tecfidera srf completed and faxed to [employer].  This will be a restart for pt.  He was on [employer]'s free drug program for Tecfidera, did not understand he needed to communicate with [employer] to continue program.  I have advised he will need to answer his phone for long distance calls over the next couple of weeks, as [employer] will reach out to him during that time.  He verbalized understanding of same/fim

## 2017-04-11 LAB — CBC WITH DIFFERENTIAL/PLATELET
BASOS: 1 %
Basophils Absolute: 0.1 10*3/uL (ref 0.0–0.2)
EOS (ABSOLUTE): 0.1 10*3/uL (ref 0.0–0.4)
EOS: 1 %
HEMATOCRIT: 43.6 % (ref 37.5–51.0)
Hemoglobin: 15 g/dL (ref 13.0–17.7)
IMMATURE GRANULOCYTES: 0 %
Immature Grans (Abs): 0 10*3/uL (ref 0.0–0.1)
LYMPHS ABS: 1 10*3/uL (ref 0.7–3.1)
Lymphs: 16 %
MCH: 31.7 pg (ref 26.6–33.0)
MCHC: 34.4 g/dL (ref 31.5–35.7)
MCV: 92 fL (ref 79–97)
MONOS ABS: 0.4 10*3/uL (ref 0.1–0.9)
Monocytes: 6 %
NEUTROS ABS: 5 10*3/uL (ref 1.4–7.0)
NEUTROS PCT: 76 %
PLATELETS: 237 10*3/uL (ref 150–379)
RBC: 4.73 x10E6/uL (ref 4.14–5.80)
RDW: 14.2 % (ref 12.3–15.4)
WBC: 6.6 10*3/uL (ref 3.4–10.8)

## 2017-04-11 LAB — HEPATIC FUNCTION PANEL
ALT: 10 IU/L (ref 0–44)
AST: 15 IU/L (ref 0–40)
Albumin: 4.3 g/dL (ref 3.6–4.8)
Alkaline Phosphatase: 66 IU/L (ref 39–117)
BILIRUBIN TOTAL: 0.8 mg/dL (ref 0.0–1.2)
Bilirubin, Direct: 0.15 mg/dL (ref 0.00–0.40)
Total Protein: 6.6 g/dL (ref 6.0–8.5)

## 2017-04-12 ENCOUNTER — Telehealth: Payer: Self-pay | Admitting: *Deleted

## 2017-04-12 NOTE — Telephone Encounter (Signed)
I have spoken with Gabriel Hanson this morning and per RAS, advised lab work done in our office is fine/fim

## 2017-04-12 NOTE — Telephone Encounter (Signed)
-----   Message from Asa Lente, MD sent at 04/11/2017  1:11 PM EDT ----- Please let the patient know that the lab work is fine.

## 2017-04-13 ENCOUNTER — Encounter: Payer: Self-pay | Admitting: *Deleted

## 2017-08-10 DIAGNOSIS — Z23 Encounter for immunization: Secondary | ICD-10-CM | POA: Diagnosis not present

## 2017-08-16 ENCOUNTER — Other Ambulatory Visit: Payer: Self-pay

## 2017-08-16 ENCOUNTER — Telehealth: Payer: Self-pay | Admitting: *Deleted

## 2017-08-16 ENCOUNTER — Encounter: Payer: Self-pay | Admitting: Neurology

## 2017-08-16 ENCOUNTER — Ambulatory Visit (INDEPENDENT_AMBULATORY_CARE_PROVIDER_SITE_OTHER): Payer: Medicare Other | Admitting: Neurology

## 2017-08-16 VITALS — BP 147/88 | HR 76 | Resp 18 | Ht 72.0 in | Wt 203.5 lb

## 2017-08-16 DIAGNOSIS — R200 Anesthesia of skin: Secondary | ICD-10-CM

## 2017-08-16 DIAGNOSIS — F329 Major depressive disorder, single episode, unspecified: Secondary | ICD-10-CM

## 2017-08-16 DIAGNOSIS — Z79899 Other long term (current) drug therapy: Secondary | ICD-10-CM

## 2017-08-16 DIAGNOSIS — F09 Unspecified mental disorder due to known physiological condition: Secondary | ICD-10-CM

## 2017-08-16 DIAGNOSIS — F32A Depression, unspecified: Secondary | ICD-10-CM

## 2017-08-16 DIAGNOSIS — R269 Unspecified abnormalities of gait and mobility: Secondary | ICD-10-CM | POA: Diagnosis not present

## 2017-08-16 DIAGNOSIS — R350 Frequency of micturition: Secondary | ICD-10-CM

## 2017-08-16 DIAGNOSIS — G35 Multiple sclerosis: Secondary | ICD-10-CM | POA: Diagnosis not present

## 2017-08-16 MED ORDER — AMPHETAMINE-DEXTROAMPHETAMINE 20 MG PO TABS: 20.0000 mg | ORAL_TABLET | Freq: Two times a day (BID) | ORAL | 0 refills | Status: DC

## 2017-08-16 MED ORDER — TAMSULOSIN HCL 0.4 MG PO CAPS
0.4000 mg | ORAL_CAPSULE | Freq: Every day | ORAL | 3 refills | Status: DC
Start: 2017-08-16 — End: 2017-12-17

## 2017-08-16 NOTE — Telephone Encounter (Signed)
Spoke with Gabriel Hanson and let him know the pharmacy he gets his Tecfidera from is [facility], phone# [phone]/fim

## 2017-08-16 NOTE — Progress Notes (Signed)
[facility]  PATIENT: Gabriel Hanson DOB: 10-Sep-1951  REFERRING DOCTOR OR PCP:  None  SOURCE: patient  _________________________________   HISTORICAL  CHIEF COMPLAINT:  Chief Complaint  Patient presents with  . Multiple Sclerosis    Sts. he ran out of Tecfidera yesterday and isn't sure who to call to reorder.  I have given him the name of the pharmacy--[facility], phone# [phone].  Sts. Ritalin is less effective for fatigue and attention/focus.  Needs r/f of Flomax/fim    HISTORY OF PRESENT ILLNESS:  Gabriel Hanson is a 66 yo man with MS.       Update 08/16/2017:   He feels his MS has been mostly stable. He is on Tecfidera. He tolerates it well.    He denies new issues with gait, strength or sensation.   His left leg is more affected from MS and right leg from ortho issues.  Both legs seem stiff and he takes a few extra seconds to stand up.     There is stable tingling in his toes that is not painful.      Bladder issues are worse but he ran out of Flomax.     He notes some visual blurring especially when he is tired. But this is stable.  He has fatigue and the Ritalin has not helped.   In the past it helped cognition but more recently it did not help at all.    Cognition continues to be a major problem.   He has trouble with focus/attnetion, STM, verbal fluency.    He is sleeping fairly well at night.  Aleve has helped the leg pain.     ________________________________________________ From 04/10/2017: MS:    He was placed on Tecfidera are in 2015. Initially, he had good compliance but then in 2017 compliance was very poor and we considered a switch to Tysabri. However, the JCV Ab was middle positive at 0.99 and he did not start.   He would like to get back on Tecfidera.   Gait/strength/sensation:   He continues to note some difficulty with his gait. It is clumsy and he feels the left leg gives out at times. He was walking 5 miles a day but more recently is  having difficulty walking 1 mile.  He also has some tingling in the feet bilaterally.   The arms have normal strength and sensation.    Vision:  He is noting more difficulty with his vision. He had optic neuritis on the right in the past and that eye has never been as strong as the left. However, more recently, the left eye also is having more difficulty.    Colors seem ok.    He had an episode of diplopia x 2 weeks many years ago and used an eye patch while driving.  No recent diplopia or eye pain now.    Bladder/bowel:   Urinary hesitancy was improved by tamsulosin. He noted a big difference when he went off the medication for a few days..   He has constipation with painful BM's only 1 -2 times a week. MiraLAX did not help at first then caused diarrhea.     Fatigue/Attention/Sleep:   He reports physical and mental fatigue.   He had ADD as a child and has had attentional problems.   He felt Ritalin helped his mental fog and attention more than his fatigue.     He sleeps well most nights having insomnia rarely.     He remains active  and walks daily.     Mood:   He notes mild depression and he has a history of a severe major depression. He does not have any suicidal ideation at this time though he did in the past..   Occasionally, he might laugh if sad or cry if happy and is also had some tearful episodes  Cognition:   He has had difficulties with focus and attention. Ritalin has helped but he ran out. He felt he was able to get much more complex during the day.       B12:   His B12 was low and he is receiving IM injections and feels it has helpd fatigue and vision.  .   When last checked 02/14/2016, B12 was no longer low.  Knee pain:   He has had right knee pain due to a torn meniscus and now notes similar pain on his left.    MS History:  He was diagnosed with MS in 2001 when he presented with right visual loss.     He had an MRI worrisome for MS.   He was placed on IV steroids and vision got 80%  better.    He was placed on an interferon first and then Copaxone because he had tolerability issues.     For two years, he was without insurance and stopped all DMT.   I started seeing him in 2015 and we started Tecfidera.   He tolerates it well but sometimes forgets the second pill because all his medications are qAM.  MRI brain 2011 shows white matter foci in the periventricular and juxtacortical white matter consistent with the clinical diagnosis of MS. The right optic nerve was reduced in size. There w was one focus in the right pons and one in the left cerebellar hemisphere, as well. None of the foci were enhancing during that MRI.    REVIEW OF SYSTEMS: Constitutional: No fevers, chills, sweats, or change in appetite Eyes: No visual changes, double vision, eye pain Ear, nose and throat: No hearing loss, ear pain, nasal congestion, sore throat Cardiovascular: No chest pain, palpitations Respiratory: No shortness of breath at rest or with exertion.   No wheezes GastrointestinaI: No nausea, vomiting, diarrhea, abdominal pain, fecal incontinence.  Severe constipation Genitourinary: as above.  Musculoskeletal: No neck pain, back pain Integumentary: No rash, pruritus, skin lesions Neurological: as above Psychiatric: Depression.  Some tearfulness inappropriately.  Cognitive decline.  No anxiety Endocrine: No palpitations, diaphoresis, change in appetite, change in weigh or increased thirst Hematologic/Lymphatic: No anemia, purpura, petechiae. Allergic/Immunologic: No itchy/runny eyes, nasal congestion, recent allergic reactions, rashes  ALLERGIES: No Known Allergies  HOME MEDICATIONS:  Current Outpatient Medications:  .  Dimethyl Fumarate 240 MG CPDR, Take 240 mg by mouth 2 (two) times daily., Disp: , Rfl:  .  amphetamine-dextroamphetamine (ADDERALL) 20 MG tablet, Take 1 tablet (20 mg total) 2 (two) times daily by mouth., Disp: 60 tablet, Rfl: 0 .  Dimethyl Fumarate 120 & 240 MG MISC,  Take by mouth., Disp: , Rfl:  .  tamsulosin (FLOMAX) 0.4 MG CAPS capsule, Take 1 capsule (0.4 mg total) daily by mouth., Disp: 90 capsule, Rfl: 3  PAST MEDICAL HISTORY: Past Medical History:  Diagnosis Date  . Depressive disorder, not elsewhere classified   . Disorder of eye, unspecified   . Memory loss   . Multiple sclerosis (HCC)   . 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 Other psoriasis   . Psychosexual dysfunction, unspecified  PAST SURGICAL HISTORY: Past Surgical History:  Procedure Laterality Date  . KNEE ARTHROSCOPY     right   . TONSILLECTOMY      FAMILY HISTORY: Family History  Problem Relation Age of Onset  . Breast cancer Mother   . COPD Father   . Heart disease Unknown     SOCIAL HISTORY:  Social History   Socioeconomic History  . Marital status: Single    Spouse name: Not on file  . Number of children: Not on file  . Years of education: Not on file  . Highest education level: Not on file  Social Needs  . Financial resource strain: Not on file  . Food insecurity - worry: Not on file  . Food insecurity - inability: Not on file  . Transportation needs - medical: Not on file  . Transportation needs - non-medical: Not on file  Occupational History  . Occupation: Energy manager: [employer]    Comment: Fulltime  Tobacco Use  . Smoking status: Heavy Tobacco Smoker    Last attempt to quit: 01/09/2015    Years since quitting: 2.6  . Smokeless tobacco: Never Used  Substance and Sexual Activity  . Alcohol use: Yes    Alcohol/week: 0.0 oz    Comment: Occasionally/fim  . Drug use: Yes    Types: Marijuana  . Sexual activity: Not on file  Other Topics Concern  . Not on file  Social History Narrative  . Not on file     PHYSICAL EXAM  Vitals:   08/16/17 1110  BP: (!) 147/88  Pulse: 76  Resp: 18  Weight: 203 lb 8 oz (92.3 kg)  Height: 6' (1.829 m)    Body mass index is 27.6  kg/m.   General: The patient is well-developed and well-nourished and in no acute distress  Neurologic Exam  Mental status: The patient is alert and oriented x 3 at the time of the examination. The patient has apparent normal recent and remote memory, with an apparently normal  concentration ability but some distractibility.   Speech is normal.  Cranial nerves: Extraocular movements are full. He has a 1+ APD on the right and color vision is mildly desaturated on the right. Facial strength and sensation is normal. Trapezius strength is normal. The tongue is midline, and the patient has symmetric elevation of the soft palate. No obvious hearing deficits are noted.  Motor:  Muscle bulk is normal.   Tone is normal. Strength is  5 / 5 in all 4 extremities.   Sensory: Sensation is normal in his arms but reduced in the left foot to touch  Coordination: Cerebellar testing shows good bilateral finger-nose-finger slightly reduced bilateral heel-to-shin  Gait and station: Station is normal.   Gait is normal. Tandem gait is very wide. Romberg is negative.   Reflexes: Deep tendon reflexes are symmetric and mildly increased in the arms and knees. There is spread at the knees. There is no ankle clonus. Marland Kitchen       DIAGNOSTIC DATA (LABS, IMAGING, TESTING) - I reviewed patient records, labs, notes, testing and imaging myself where available.  Lab Results  Component Value Date   WBC 6.6 04/10/2017   HGB 15.0 04/10/2017   HCT 43.6 04/10/2017   MCV 92 04/10/2017   PLT 237 04/10/2017      Component Value Date/Time   NA 140 09/02/2012 1539   K 3.9 09/02/2012 1539   CL 102 09/02/2012 1539   CO2 30 09/02/2012  1539   GLUCOSE 115 (H) 09/02/2012 1539   BUN 12 09/02/2012 1539   CREATININE 0.84 09/02/2012 1539   CALCIUM 9.5 09/02/2012 1539   PROT 6.6 04/10/2017 1036   ALBUMIN 4.3 04/10/2017 1036   AST 15 04/10/2017 1036   ALT 10 04/10/2017 1036   ALKPHOS 66 04/10/2017 1036   BILITOT 0.8 04/10/2017  1036   GFRNONAA >90 09/02/2012 1539   GFRAA >90 09/02/2012 1539       ASSESSMENT AND PLAN  MULTIPLE SCLEROSIS - Plan: CBC with Differential/Platelet  Urinary frequency - Plan: tamsulosin (FLOMAX) 0.4 MG CAPS capsule  Cognitive dysfunction  High risk medication use  Numbness  Gait disturbance  Depression, unspecified depression type  1.   He was given some samples of Tecfidera and we will try to get him back on track to get the medication through mail order. I'll check a CBC. 2.   Continue tamsulosin.   Renew 3.  He is advised to stay active and exercises as tolerated. 4.  Adderall 20 mg in the morning and at lunch.  5.   He will return to see me in 4- months or sooner if he has new or worsening neurologic symptoms.  Richard A. Epimenio FootSater, MD, PhD 08/16/2017, 11:36 AM Certified in Neurology, Clinical Neurophysiology, Sleep Medicine, Pain Medicine and Neuroimaging  [facility] [address], [address] [place], [place] [address] [phone]      MS History

## 2017-08-17 ENCOUNTER — Telehealth: Payer: Self-pay | Admitting: *Deleted

## 2017-08-17 LAB — CBC WITH DIFFERENTIAL/PLATELET
BASOS ABS: 0.1 10*3/uL (ref 0.0–0.2)
BASOS: 1 %
EOS (ABSOLUTE): 0.1 10*3/uL (ref 0.0–0.4)
Eos: 2 %
HEMOGLOBIN: 14 g/dL (ref 13.0–17.7)
Hematocrit: 41.1 % (ref 37.5–51.0)
IMMATURE GRANS (ABS): 0 10*3/uL (ref 0.0–0.1)
Immature Granulocytes: 1 %
LYMPHS: 14 %
Lymphocytes Absolute: 0.8 10*3/uL (ref 0.7–3.1)
MCH: 31.3 pg (ref 26.6–33.0)
MCHC: 34.1 g/dL (ref 31.5–35.7)
MCV: 92 fL (ref 79–97)
MONOCYTES: 8 %
Monocytes Absolute: 0.5 10*3/uL (ref 0.1–0.9)
NEUTROS ABS: 4.5 10*3/uL (ref 1.4–7.0)
Neutrophils: 74 %
Platelets: 239 10*3/uL (ref 150–379)
RBC: 4.48 x10E6/uL (ref 4.14–5.80)
RDW: 13.6 % (ref 12.3–15.4)
WBC: 6 10*3/uL (ref 3.4–10.8)

## 2017-08-17 NOTE — Telephone Encounter (Signed)
Pt. aware lab work done in our office is fine/fim 

## 2017-08-17 NOTE — Telephone Encounter (Signed)
-----   Message from Asa Lente, MD sent at 08/17/2017  8:15 AM EST ----- Please let the patient know that the lab work is fine.

## 2017-09-28 ENCOUNTER — Ambulatory Visit (INDEPENDENT_AMBULATORY_CARE_PROVIDER_SITE_OTHER): Payer: Medicare Other

## 2017-09-28 VITALS — BP 120/82 | HR 100 | Ht 72.0 in | Wt 200.0 lb

## 2017-09-28 DIAGNOSIS — Z0000 Encounter for general adult medical examination without abnormal findings: Secondary | ICD-10-CM | POA: Diagnosis not present

## 2017-09-28 DIAGNOSIS — Z1159 Encounter for screening for other viral diseases: Secondary | ICD-10-CM | POA: Diagnosis not present

## 2017-09-28 DIAGNOSIS — Z13220 Encounter for screening for lipoid disorders: Secondary | ICD-10-CM

## 2017-09-28 DIAGNOSIS — Z114 Encounter for screening for human immunodeficiency virus [HIV]: Secondary | ICD-10-CM | POA: Diagnosis not present

## 2017-09-28 DIAGNOSIS — Z598 Other problems related to housing and economic circumstances: Secondary | ICD-10-CM | POA: Diagnosis not present

## 2017-09-28 DIAGNOSIS — Z23 Encounter for immunization: Secondary | ICD-10-CM | POA: Diagnosis not present

## 2017-09-28 DIAGNOSIS — Z131 Encounter for screening for diabetes mellitus: Secondary | ICD-10-CM | POA: Diagnosis not present

## 2017-09-28 DIAGNOSIS — Z87891 Personal history of nicotine dependence: Secondary | ICD-10-CM

## 2017-09-28 DIAGNOSIS — Z599 Problem related to housing and economic circumstances, unspecified: Secondary | ICD-10-CM

## 2017-09-28 NOTE — Patient Instructions (Addendum)
Gabriel Hanson , Thank you for taking time to come for your Medicare Wellness Visit. I appreciate your ongoing commitment to your health goals. Please review the following plan we discussed and let me know if I can assist you in the future.   Screening recommendations/referrals: Colonoscopy: declined Recommended yearly ophthalmology/optometry visit for glaucoma screening and checkup Recommended yearly dental visit for hygiene and checkup  Vaccinations: Influenza vaccine: up to date   Pneumococcal vaccine: administered today  Tdap vaccine: declined due to insurance Shingles vaccine: Check with your pharmacy about receiving this vaccine      Advanced directives: Advance directive discussed with you today. Even though you declined this today please call our office should you change your mind and we can give you the proper paperwork for you to fill out.   Conditions/risks identified: Start trying to exercise more on a weekly basis.   Next appointment: 10/11/17 @ 10:40 am with Dr. Neva Seat  Preventive Care 65 Years and Older, Male Preventive care refers to lifestyle choices and visits with your health care provider that can promote health and wellness. What does preventive care include?  A yearly physical exam. This is also called an annual well check.  Dental exams once or twice a year.  Routine eye exams. Ask your health care provider how often you should have your eyes checked.  Personal lifestyle choices, including:  Daily care of your teeth and gums.  Regular physical activity.  Eating a healthy diet.  Avoiding tobacco and drug use.  Limiting alcohol use.  Practicing safe sex.  Taking low doses of aspirin every day.  Taking vitamin and mineral supplements as recommended by your health care provider. What happens during an annual well check? The services and screenings done by your health care provider during your annual well check will depend on your age, overall health,  lifestyle risk factors, and family history of disease. Counseling  Your health care provider may ask you questions about your:  Alcohol use.  Tobacco use.  Drug use.  Emotional well-being.  Home and relationship well-being.  Sexual activity.  Eating habits.  History of falls.  Memory and ability to understand (cognition).  Work and work Astronomer. Screening  You may have the following tests or measurements:  Height, weight, and BMI.  Blood pressure.  Lipid and cholesterol levels. These may be checked every 5 years, or more frequently if you are over 42 years old.  Skin check.  Lung cancer screening. You may have this screening every year starting at age 36 if you have a 30-pack-year history of smoking and currently smoke or have quit within the past 15 years.  Fecal occult blood test (FOBT) of the stool. You may have this test every year starting at age 69.  Flexible sigmoidoscopy or colonoscopy. You may have a sigmoidoscopy every 5 years or a colonoscopy every 10 years starting at age 81.  Prostate cancer screening. Recommendations will vary depending on your family history and other risks.  Hepatitis C blood test.  Hepatitis B blood test.  Sexually transmitted disease (STD) testing.  Diabetes screening. This is done by checking your blood sugar (glucose) after you have not eaten for a while (fasting). You may have this done every 1-3 years.  Abdominal aortic aneurysm (AAA) screening. You may need this if you are a current or former smoker.  Osteoporosis. You may be screened starting at age 49 if you are at high risk. Talk with your health care provider about your test  results, treatment options, and if necessary, the need for more tests. Vaccines  Your health care provider may recommend certain vaccines, such as:  Influenza vaccine. This is recommended every year.  Tetanus, diphtheria, and acellular pertussis (Tdap, Td) vaccine. You may need a Td booster  every 10 years.  Zoster vaccine. You may need this after age 27.  Pneumococcal 13-valent conjugate (PCV13) vaccine. One dose is recommended after age 65.  Pneumococcal polysaccharide (PPSV23) vaccine. One dose is recommended after age 30. Talk to your health care provider about which screenings and vaccines you need and how often you need them. This information is not intended to replace advice given to you by your health care provider. Make sure you discuss any questions you have with your health care provider. Document Released: 10/22/2015 Document Revised: 06/14/2016 Document Reviewed: 07/27/2015 Elsevier Interactive Patient Education  2017 [place] Prevention in the Home Falls can cause injuries. They can happen to people of all ages. There are many things you can do to make your home safe and to help prevent falls. What can I do on the outside of my home?  Regularly fix the edges of walkways and driveways and fix any cracks.  Remove anything that might make you trip as you walk through a door, such as a raised step or threshold.  Trim any bushes or trees on the path to your home.  Use bright outdoor lighting.  Clear any walking paths of anything that might make someone trip, such as rocks or tools.  Regularly check to see if handrails are loose or broken. Make sure that both sides of any steps have handrails.  Any raised decks and porches should have guardrails on the edges.  Have any leaves, snow, or ice cleared regularly.  Use sand or salt on walking paths during winter.  Clean up any spills in your garage right away. This includes oil or grease spills. What can I do in the bathroom?  Use night lights.  Install grab bars by the toilet and in the tub and shower. Do not use towel bars as grab bars.  Use non-skid mats or decals in the tub or shower.  If you need to sit down in the shower, use a plastic, non-slip stool.  Keep the floor dry. Clean up any  water that spills on the floor as soon as it happens.  Remove soap buildup in the tub or shower regularly.  Attach bath mats securely with double-sided non-slip rug tape.  Do not have throw rugs and other things on the floor that can make you trip. What can I do in the bedroom?  Use night lights.  Make sure that you have a light by your bed that is easy to reach.  Do not use any sheets or blankets that are too big for your bed. They should not hang down onto the floor.  Have a firm chair that has side arms. You can use this for support while you get dressed.  Do not have throw rugs and other things on the floor that can make you trip. What can I do in the kitchen?  Clean up any spills right away.  Avoid walking on wet floors.  Keep items that you use a lot in easy-to-reach places.  If you need to reach something above you, use a strong step stool that has a grab bar.  Keep electrical cords out of the way.  Do not use floor polish or wax that makes  floors slippery. If you must use wax, use non-skid floor wax.  Do not have throw rugs and other things on the floor that can make you trip. What can I do with my stairs?  Do not leave any items on the stairs.  Make sure that there are handrails on both sides of the stairs and use them. Fix handrails that are broken or loose. Make sure that handrails are as long as the stairways.  Check any carpeting to make sure that it is firmly attached to the stairs. Fix any carpet that is loose or worn.  Avoid having throw rugs at the top or bottom of the stairs. If you do have throw rugs, attach them to the floor with carpet tape.  Make sure that you have a light switch at the top of the stairs and the bottom of the stairs. If you do not have them, ask someone to add them for you. What else can I do to help prevent falls?  Wear shoes that:  Do not have high heels.  Have rubber bottoms.  Are comfortable and fit you well.  Are closed  at the toe. Do not wear sandals.  If you use a stepladder:  Make sure that it is fully opened. Do not climb a closed stepladder.  Make sure that both sides of the stepladder are locked into place.  Ask someone to hold it for you, if possible.  Clearly mark and make sure that you can see:  Any grab bars or handrails.  First and last steps.  Where the edge of each step is.  Use tools that help you move around (mobility aids) if they are needed. These include:  Canes.  Walkers.  Scooters.  Crutches.  Turn on the lights when you go into a dark area. Replace any light bulbs as soon as they burn out.  Set up your furniture so you have a clear path. Avoid moving your furniture around.  If any of your floors are uneven, fix them.  If there are any pets around you, be aware of where they are.  Review your medicines with your doctor. Some medicines can make you feel dizzy. This can increase your chance of falling. Ask your doctor what other things that you can do to help prevent falls. This information is not intended to replace advice given to you by your health care provider. Make sure you discuss any questions you have with your health care provider. Document Released: 07/22/2009 Document Revised: 03/02/2016 Document Reviewed: 10/30/2014 Elsevier Interactive Patient Education  2017 [employer].

## 2017-09-28 NOTE — Progress Notes (Signed)
Subjective:   Gabriel Hanson is a 66 y.o. male who presents for an Initial Medicare Annual Wellness Visit.  Review of Systems  N/A Cardiac Risk Factors include: advanced age (>38men, >51 women);smoking/ tobacco exposure;male gender    Objective:    Today's Vitals   09/28/17 0858  BP: 120/82  Pulse: 100  SpO2: 98%  Weight: 200 lb (90.7 kg)  Height: 6' (1.829 m)   Body mass index is 27.12 kg/m.  Advanced Directives 09/28/2017  Does Patient Have a Medical Advance Directive? No  Would patient like information on creating a medical advance directive? No - Patient declined    Current Medications (verified) Outpatient Encounter Medications as of 09/28/2017  Medication Sig  . amphetamine-dextroamphetamine (ADDERALL) 20 MG tablet Take 1 tablet (20 mg total) 2 (two) times daily by mouth. (Patient taking differently: Take 20 mg by mouth daily. )  . Dimethyl Fumarate 120 & 240 MG MISC Take by mouth.  . Dimethyl Fumarate 240 MG CPDR Take 240 mg by mouth 2 (two) times daily.  . tamsulosin (FLOMAX) 0.4 MG CAPS capsule Take 1 capsule (0.4 mg total) daily by mouth.   No facility-administered encounter medications on file as of 09/28/2017.     Allergies (verified) Patient has no known allergies.   History: Past Medical History:  Diagnosis Date  . Depressive disorder, not elsewhere classified   . Disorder of eye, unspecified   . Memory loss   . Multiple sclerosis (HCC)   . 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 Other psoriasis   . Psychosexual dysfunction, unspecified    Past Surgical History:  Procedure Laterality Date  . KNEE ARTHROSCOPY     right   . TONSILLECTOMY     Family History  Problem Relation Age of Onset  . Breast cancer Mother   . COPD Father   . Heart disease Unknown    Social History   Socioeconomic History  . Marital status: Single    Spouse name: None  . Number of children: 0  . Years of education: None  .  Highest education level: Bachelor's degree (e.g., BA, AB, BS)  Social Needs  . Financial resource strain: Not very hard  . Food insecurity - worry: Often true  . Food insecurity - inability: Often true  . Transportation needs - medical: No  . Transportation needs - non-medical: No  Occupational History  . Occupation: Energy manager: [employer]    Comment: Fulltime  Tobacco Use  . Smoking status: Heavy Tobacco Smoker    Years: 44.00    Types: Cigarettes  . Smokeless tobacco: Never Used  Substance and Sexual Activity  . Alcohol use: Yes    Alcohol/week: 0.0 oz    Comment: Occasionally/fim  . Drug use: Yes    Types: Marijuana  . Sexual activity: None  Other Topics Concern  . None  Social History Narrative  . None   Tobacco Counseling Ready to quit: No Counseling given: Not Answered   Clinical Intake:  Pre-visit preparation completed: Yes  Pain : No/denies pain     Nutritional Status: BMI 25 -29 Overweight Nutritional Risks: None Diabetes: No  How often do you need to have someone help you when you read instructions, pamphlets, or other written materials from your doctor or pharmacy?: 1 - Never What is the last grade level you completed in school?: Bachelor's degree  Interpreter Needed?: No  Information entered by :: Gabriel Shy, LPN  Activities of Daily Living In your present state of health, do you have any difficulty performing the following activities: 09/28/2017  Hearing? N  Vision? Y  Comment Patient has issues with reading materials.  Difficulty concentrating or making decisions? Y  Comment Patient currently takes Adderall  Walking or climbing stairs? Y  Comment Patient has MS and has issues with walking  Dressing or bathing? N  Doing errands, shopping? N  Preparing Food and eating ? N  Using the Toilet? N  In the past six months, have you accidently leaked urine? Y  Comment Patient has MS and has urine leakage frequently  Do  you have problems with loss of bowel control? N  Managing your Medications? N  Managing your Finances? N  Housekeeping or managing your Housekeeping? N  Some recent data might be hidden     Immunizations and Health Maintenance Immunization History  Administered Date(s) Administered  . Influenza,inj,Quad PF,6+ Mos 10/10/2016  . Pneumococcal Conjugate-13 09/28/2017   Health Maintenance Due  Topic Date Due  . Hepatitis C Screening  1950/10/24  . HIV Screening  11/27/1965    Patient Care Team: Gabriel Hanson, Jeffrey R, MD as PCP - General (Family Medicine)  Indicate any recent Medical Services you may have received from other than [facility] providers in the past year (date may be approximate).    Assessment:   This is a routine wellness examination for Gabriel Hanson.  Hearing/Vision screen Hearing Screening Comments: Patient has had a hearing exam in the past and passed Vision Screening Comments: [facility]   Dietary issues and exercise activities discussed: Current Exercise Habits: The patient does not participate in regular exercise at present, Exercise limited by: None identified  Goals    . Exercise 3x per week (30 min per time)     Patient states that he wants to start trying to exercise more on a weekly basis.       Depression Screen PHQ 2/9 Scores 09/28/2017 01/18/2017 10/10/2016  PHQ - 2 Score 4 0 0  PHQ- 9 Score 6 - -    Fall Risk Fall Risk  09/28/2017 01/18/2017 10/10/2016  Falls in the past year? No No Yes  Number falls in past yr: - - 1  Injury with Fall? - - Yes    Is the patient's home free of loose throw rugs in walkways, pet beds, electrical cords, etc?   yes      Grab bars in the bathroom? no      Handrails on the stairs?   no, patient has not stairs      Adequate lighting?   yes  Timed Get Up and Go performed: yes, passed, completed within 30 seconds  Cognitive Function:     6CIT Screen 09/28/2017  What Year?  0 points  What month? 0 points  What time? 0 points  Count back from 20 0 points  Months in reverse 2 points  Repeat phrase 6 points  Total Score 8    Screening Tests Health Maintenance  Topic Date Due  . Hepatitis C Screening  1950/10/24  . HIV Screening  11/27/1965  . COLONOSCOPY  09/28/2018 (Originally 11/27/2001)  . TETANUS/TDAP  09/28/2018 (Originally 11/27/1970)  . PNA vac Low Risk Adult (2 of 2 - PPSV23) 09/28/2018  . INFLUENZA VACCINE  Completed    Qualifies for Shingles Vaccine? Yes, advised patient to check with pharmacy about receiving this vaccine   Cancer Screenings: Lung:  Low Dose CT Chest recommended if Age 4-80 years, 30 pack-year currently smoking OR have quit w/in 15years. Patient does qualify., Referral for lung cancer screening ordered in Epic.  Colorectal: Patient declined colonoscopy  Additional Screenings:  Hepatitis B/HIV/Syphillis:HIV ordered today, Hep B and syphillis not indicated Hepatitis C Screening: Ordered today       Plan:   I have personally reviewed and noted the following in the patient's chart:   . Medical and social history . Use of alcohol, tobacco or illicit drugs  . Current medications and supplements . Functional ability and status . Nutritional status . Physical activity . Advanced directives . List of other physicians . Hospitalizations, surgeries, and ER visits in previous 12 months . Vitals . Screenings to include cognitive, depression, and falls . Referrals and appointments  In addition, I have reviewed and discussed with patient certain preventive protocols, quality metrics, and best practice recommendations. A written personalized care plan for preventive services as well as general preventive health recommendations were provided to patient.   Patient declined colonoscopy. Referral to [facility] for food assistance ordered. Lung cancer screening referral ordered.  1. Screening cholesterol level - Lipid panel  2. Screening  for diabetes mellitus - Comprehensive metabolic panel  3. Need for hepatitis C screening test - Hepatitis C Antibody  4. Screening for HIV (human immunodeficiency virus) - HIV antibody  5. History of tobacco abuse - Ambulatory Referral for Lung Cancer Scre  6. Financial difficulties - Ambulatory referral to [facility] Care Team  7. Encounter for Medicare annual wellness exam  8. Need for vaccination with 13-polyvalent pneumococcal conjugate vaccine - PCV 13 (Prevnar)  Jayc, [place], [place]   [id number]

## 2017-09-29 LAB — LIPID PANEL
CHOL/HDL RATIO: 4.3 ratio (ref 0.0–5.0)
Cholesterol, Total: 202 mg/dL — ABNORMAL HIGH (ref 100–199)
HDL: 47 mg/dL (ref >39)
LDL CALC: 135 mg/dL — AB (ref 0–99)
Triglycerides: 101 mg/dL (ref 0–149)
VLDL CHOLESTEROL CAL: 20 mg/dL (ref 5–40)

## 2017-09-29 LAB — COMPREHENSIVE METABOLIC PANEL
ALK PHOS: 71 IU/L (ref 39–117)
ALT: 10 IU/L (ref 0–44)
AST: 13 IU/L (ref 0–40)
Albumin/Globulin Ratio: 2.1 (ref 1.2–2.2)
Albumin: 4.6 g/dL (ref 3.6–4.8)
BUN/Creatinine Ratio: 14 (ref 10–24)
BUN: 13 mg/dL (ref 8–27)
Bilirubin Total: 0.6 mg/dL (ref 0.0–1.2)
CO2: 23 mmol/L (ref 20–29)
CREATININE: 0.9 mg/dL (ref 0.76–1.27)
Calcium: 9.4 mg/dL (ref 8.6–10.2)
Chloride: 104 mmol/L (ref 96–106)
GFR calc Af Amer: 103 mL/min/{1.73_m2} (ref >59)
GFR calc non Af Amer: 89 mL/min/{1.73_m2} (ref >59)
GLUCOSE: 93 mg/dL (ref 65–99)
Globulin, Total: 2.2 g/dL (ref 1.5–4.5)
Potassium: 4.4 mmol/L (ref 3.5–5.2)
Sodium: 142 mmol/L (ref 134–144)
Total Protein: 6.8 g/dL (ref 6.0–8.5)

## 2017-09-29 LAB — HEPATITIS C ANTIBODY: Hep C Virus Ab: <0.1 s/co ratio (ref 0.0–0.9)

## 2017-09-29 LAB — HIV ANTIBODY (ROUTINE TESTING W REFLEX): HIV SCREEN 4TH GENERATION: NONREACTIVE

## 2017-10-04 ENCOUNTER — Other Ambulatory Visit: Payer: Self-pay | Admitting: Acute Care

## 2017-10-04 DIAGNOSIS — F17210 Nicotine dependence, cigarettes, uncomplicated: Principal | ICD-10-CM

## 2017-10-04 DIAGNOSIS — Z122 Encounter for screening for malignant neoplasm of respiratory organs: Secondary | ICD-10-CM

## 2017-10-10 ENCOUNTER — Encounter: Payer: Self-pay | Admitting: Acute Care

## 2017-10-10 ENCOUNTER — Ambulatory Visit (INDEPENDENT_AMBULATORY_CARE_PROVIDER_SITE_OTHER): Payer: Medicare Other | Admitting: Acute Care

## 2017-10-10 DIAGNOSIS — F17210 Nicotine dependence, cigarettes, uncomplicated: Secondary | ICD-10-CM | POA: Diagnosis not present

## 2017-10-10 NOTE — Progress Notes (Signed)
Shared Decision Making Visit Lung Cancer Screening Program [phone])   Eligibility:  Age 67 y.o.  Pack Years Smoking History Calculation 37 pack year smoking history (# packs/per year x # years smoked)  Recent History of coughing up blood  no  Unexplained weight loss? no ( >Than 15 pounds within the last 6 months )  Prior History Lung / other cancer no (Diagnosis within the last 5 years already requiring surveillance chest CT Scans).  Smoking Status Current Smoker  Former Smokers: Years since quit: NA  Quit Date: NA  Visit Components:  Discussion included one or more decision making aids. yes  Discussion included risk/benefits of screening. yes  Discussion included potential follow up diagnostic testing for abnormal scans. yes  Discussion included meaning and risk of over diagnosis. yes  Discussion included meaning and risk of False Positives. yes  Discussion included meaning of total radiation exposure. yes  Counseling Included:  Importance of adherence to annual lung cancer LDCT screening. yes  Impact of comorbidities on ability to participate in the program. yes  Ability and willingness to under diagnostic treatment. yes  Smoking Cessation Counseling:  Current Smokers:   Discussed importance of smoking cessation. yes  Information about tobacco cessation classes and interventions provided to patient. yes  Patient provided with "ticket" for LDCT Scan. yes  Symptomatic Patient. no  Counseling  Diagnosis Code: Tobacco Use Z72.0  Asymptomatic Patient yes  Counseling (Intermediate counseling: > three minutes counseling) G6269  Former Smokers:   Discussed the importance of maintaining cigarette abstinence. yes  Diagnosis Code: Personal History of Nicotine Dependence. S85.462  Information about tobacco cessation classes and interventions provided to patient. Yes  Patient provided with "ticket" for LDCT Scan. yes  Written Order for Lung Cancer  Screening with LDCT placed in Epic. Yes (CT Chest Lung Cancer Screening Low Dose W/O CM) [id number] Z12.2-Screening of respiratory organs Z87.891-Personal history of nicotine dependence  I have spent 25 minutes of face to face time with Mr. Debnam discussing the risks and benefits of lung cancer screening. We viewed a power point together that explained in detail the above noted topics. We paused at intervals to allow for questions to be asked and answered to ensure understanding.We discussed that the single most powerful action that he can take to decrease her risk of developing lung cancer is to quit smoking. We discussed whether or not he  is ready to commit to setting a quit date. We discussed options for tools to aid in quitting smoking including nicotine replacement therapy, non-nicotine medications, support groups, Quit Smart classes, and behavior modification. We discussed that often times setting smaller, more achievable goals, such as eliminating 1 cigarette a day for a week and then 2 cigarettes a day for a week can be helpful in slowly decreasing the number of cigarettes smoked. This allows for a sense of accomplishment as well as providing a clinical benefit. I gave him the " Be Stronger Than Your Excuses" card with contact information for community resources, classes, free nicotine replacement therapy, and access to mobile apps, text messaging, and on-line smoking cessation help. I have also given her my card and contact information in the event he needs to contact me. We discussed the time and location of the scan, and that either Abigail Miyamoto RN or I will call with the results within 24-48 hours of receiving them. I have offered him  a copy of the power point we viewed  as a resource in the event they need reinforcement  of the concepts we discussed today in the office. The patient verbalized understanding of all of  the above and had no further questions upon leaving the office. They have my  contact information in the event they have any further questions.  I spent 4-5 minutes counseling on smoking cessation and the health risks of continued tobacco abuse.  I explained to the patient that there has been a high incidence of coronary artery disease noted on these exams. I explained that this is a non-gated exam therefore degree or severity cannot be determined. This patient is not on statin therapy. I have asked the patient to follow-up with their PCP regarding any incidental finding of coronary artery disease and management with diet or medication as their PCP  feels is clinically indicated. The patient verbalized understanding of the above and had no further questions upon completion of the visit.      Bevelyn Ngo, NP  10/10/2017

## 2017-10-11 ENCOUNTER — Ambulatory Visit (INDEPENDENT_AMBULATORY_CARE_PROVIDER_SITE_OTHER)
Admission: RE | Admit: 2017-10-11 | Discharge: 2017-10-11 | Disposition: A | Discharge status: Home / Self Care | Payer: Medicare Other | Source: Ambulatory Visit | Attending: Acute Care | Admitting: Acute Care

## 2017-10-11 ENCOUNTER — Ambulatory Visit: Payer: Medicare Other | Admitting: Family Medicine

## 2017-10-11 DIAGNOSIS — F17210 Nicotine dependence, cigarettes, uncomplicated: Secondary | ICD-10-CM

## 2017-10-11 DIAGNOSIS — Z122 Encounter for screening for malignant neoplasm of respiratory organs: Secondary | ICD-10-CM

## 2017-10-11 DIAGNOSIS — Z87891 Personal history of nicotine dependence: Secondary | ICD-10-CM

## 2017-10-19 ENCOUNTER — Telehealth: Payer: Self-pay

## 2017-10-19 NOTE — Telephone Encounter (Signed)
Outgoing call to patient. Received a Administrator, arts from Advertising copywriter. Patient also requested some social resources, saying he would like to get out and interact with people and volunteer. I provided several senior center resources as well as food resources, if he should need that. Gave my phone number should he have any questions or additional needs.   Sherle Poe, B.A.  Care Guide - Primary Care at [facility] [phone]

## 2017-10-22 ENCOUNTER — Ambulatory Visit (INDEPENDENT_AMBULATORY_CARE_PROVIDER_SITE_OTHER): Payer: Medicare Other | Admitting: Family Medicine

## 2017-10-22 ENCOUNTER — Other Ambulatory Visit: Payer: Self-pay

## 2017-10-22 ENCOUNTER — Encounter: Payer: Self-pay | Admitting: Family Medicine

## 2017-10-22 VITALS — BP 108/70 | HR 104 | Temp 98.2°F | Resp 16 | Ht 72.84 in | Wt 201.0 lb

## 2017-10-22 DIAGNOSIS — Z0000 Encounter for general adult medical examination without abnormal findings: Secondary | ICD-10-CM

## 2017-10-22 DIAGNOSIS — Z125 Encounter for screening for malignant neoplasm of prostate: Secondary | ICD-10-CM

## 2017-10-22 DIAGNOSIS — Z136 Encounter for screening for cardiovascular disorders: Secondary | ICD-10-CM | POA: Diagnosis not present

## 2017-10-22 DIAGNOSIS — K5900 Constipation, unspecified: Secondary | ICD-10-CM

## 2017-10-22 DIAGNOSIS — Z720 Tobacco use: Secondary | ICD-10-CM | POA: Diagnosis not present

## 2017-10-22 NOTE — Progress Notes (Signed)
Subjective:  By signing my name below, I, Gabriel Hanson, attest that this documentation has been prepared under the direction and in the presence of Gabriel Agreste, MD Electronically Signed: Ladene Artist, ED Scribe 10/22/2017 at 12:24 PM.   Patient ID: Gabriel Hanson, male    DOB: 01-18-1951, 68 y.o.   MRN: [id number]  Chief Complaint  Patient presents with  . Annual Exam   HPI Gabriel Hanson is a 67 y.o. male who presents to Primary Care at [facility]. Recently seen for medicare wellness exam 09/28/17. Here for f/u from that AWV.   CA Screening Colonoscopy: last colonoscopy was 11/27/01, pt would like to defer colonoscopy at this time due to finances Prostate CA Screening: Normal PSA of 0.8 in 10/2016, pt agrees to PSA and DRE at this visit Lung CA Screening: on 1/2 pt had a meeting with pulmonology for low dose CT scan performed on 1/3. Benign appearance, continue annual screening.  Immunizations Immunization History  Administered Date(s) Administered  . Influenza,inj,Quad PF,6+ Mos 10/10/2016  . Pneumococcal Conjugate-13 09/28/2017  Shingles: recommended Hep C Screening: ordered last visit Prevnar was given  Depression Screening Depression screen [facility] 2/9 10/22/2017 09/28/2017 01/18/2017 10/10/2016  Decreased Interest 0 2 0 0  Down, Depressed, Hopeless 0 2 0 0  PHQ - 2 Score 0 4 0 0  Altered sleeping - 0 - -  Tired, decreased energy - 0 - -  Change in appetite - 0 - -  Feeling bad or failure about yourself  - 0 - -  Trouble concentrating - 2 - -  Moving slowly or fidgety/restless - 0 - -  Suicidal thoughts - 0 - -  PHQ-9 Score - 6 - -  Difficult doing work/chores - Somewhat difficult - -     Visual Acuity Screening   Right eye Left eye Both eyes  Without correction:     With correction: 20/20 20/20 20/20    Vision: Dentist: Exercise: walks 1 mile/day   Fall Screening No falls in the past yr.  H/o MS Followed by Dr. Felecia Shelling, last visit in Nov. Takes adderall 20 mg  bid and Flomax for urinary frequency. Discussed tefidera at Nov 2018 visit with urology with plan for f/u in 4 months.  Pt states that he has frequent leg numbness x several yrs which has not changed. He is currently taking tefidera 240 mg qd. Also reports constipation with only 1 BM/wk. He is not consuming much water but states he does drink a gallon of iced tea a day.  SOB Pt walked 2 miles to this OV. He reports occasional sob and wheezing. Denies sob and wheezing while walking, cp.  Hyperlipidemia Lab Results  Component Value Date   CHOL 202 (H) 09/28/2017   HDL 47 09/28/2017   LDLCALC 135 (H) 09/28/2017   TRIG 101 09/28/2017   CHOLHDL 4.3 09/28/2017   Lab Results  Component Value Date   ALT 10 09/28/2017   AST 13 09/28/2017   ALKPHOS 71 09/28/2017   BILITOT 0.6 09/28/2017  Planned for initial diet approach and exercise. Recheck in 6 months.   Cardiovascular Screening Last EKG in 2013. Parents were smokers and passed in their 83s and 26s; pt is unsure if their deaths were cardiac related.  Advanced Directives Reccommended at [place]. Declined info as he is already working on it.  Patient Active Problem List   Diagnosis Date Noted  . Numbness 05/17/2015  . Gait disturbance 05/17/2015  . High risk medication use 01/15/2015  .  Urinary hesitancy 01/15/2015  . Cognitive dysfunction 03/18/2010  . Erectile dysfunction 03/18/2010  . Depression 03/18/2010  . MULTIPLE SCLEROSIS 03/18/2010  . UNSPECIFIED DISORDER OF EYE 03/18/2010  . Constipation 03/18/2010  . RECTAL BLEEDING 03/18/2010  . PSORIASIS 03/18/2010  . WEIGHT LOSS 03/18/2010   Past Medical History:  Diagnosis Date  . Depressive disorder, not elsewhere classified   . Disorder of eye, unspecified   . Memory loss   . Multiple sclerosis ([place])   . 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 Other psoriasis   . Psychosexual dysfunction, unspecified    Past Surgical History:    Procedure Laterality Date  . KNEE ARTHROSCOPY     right   . TONSILLECTOMY     No Known Allergies Prior to Admission medications   Medication Sig Start Date End Date Taking? Authorizing Provider  amphetamine-dextroamphetamine (ADDERALL) 20 MG tablet Take 1 tablet (20 mg total) 2 (two) times daily by mouth. Patient taking differently: Take 20 mg by mouth daily.  08/16/17  Yes Sater, Nanine Means, MD  Dimethyl Fumarate 240 MG CPDR Take 240 mg by mouth 2 (two) times daily.   Yes [provider]  tamsulosin (FLOMAX) 0.4 MG CAPS capsule Take 1 capsule (0.4 mg total) daily by mouth. 08/16/17  Yes Sater, Nanine Means, MD   Social History   Socioeconomic History  . Marital status: Single    Spouse name: Not on file  . Number of children: 0  . Years of education: Not on file  . Highest education level: Bachelor's degree (e.g., BA, AB, BS)  Social Needs  . Financial resource strain: Not very hard  . Food insecurity - worry: Often true  . Food insecurity - inability: Often true  . Transportation needs - medical: No  . Transportation needs - non-medical: No  Occupational History  . Occupation: Agricultural consultant: [employer]    Comment: Fulltime  Tobacco Use  . Smoking status: Heavy Tobacco Smoker    Packs/day: 1.00    Years: 44.00    Pack years: 44.00    Types: Cigarettes  . Smokeless tobacco: Never Used  Substance and Sexual Activity  . Alcohol use: Yes    Alcohol/week: 0.0 oz    Comment: Occasionally/fim  . Drug use: Yes    Types: Marijuana  . Sexual activity: Not on file  Other Topics Concern  . Not on file  Social History Narrative  . Not on file   Review of Systems  Respiratory: Positive for shortness of breath (occasional) and wheezing (occasional).   Cardiovascular: Negative for chest pain.  Gastrointestinal: Positive for constipation.  Neurological: Positive for numbness.      Objective:   Physical Exam  Constitutional: He is oriented to person,  place, and time. He appears well-developed and well-nourished.  HENT:  Head: Normocephalic and atraumatic.  Eyes: EOM are normal. Pupils are equal, round, and reactive to light.  Neck: No JVD present. Carotid bruit is not present.  Cardiovascular: Regular rhythm and normal heart sounds. Tachycardia present.  No murmur heard. Pulmonary/Chest: Effort normal and breath sounds normal. He has no rales.  Musculoskeletal: He exhibits no edema.  Neurological: He is alert and oriented to person, place, and time.  Skin: Skin is warm and dry.  Psychiatric: He has a normal mood and affect.  Vitals reviewed.  Vitals:   10/22/17 1100  BP: 108/70  Pulse: (!) 104  Resp: 16  Temp: 98.2 F (36.8  C)  TempSrc: Oral  SpO2: 97%  Weight: 201 lb (91.2 kg)  Height: 6' 0.84" (1.85 m)      Assessment & Plan:   SABIN GIBEAULT is a 67 y.o. male Annual physical exam  -  - anticipatory guidance as below in AVS, screening labs if needed. Health maintenance items as above in HPI discussed/recommended as applicable.   - no concerning responses on depression, fall, or functional status screening other than discussed above. Some challenges may be related to his multiple sclerosis. Followed routinely by neurology..  Screening for prostate cancer  -Initially discussed screening and did agree for PSA and digital rectal exam, then later during visit declined any blood work or that exam. Plan to discuss further at next visit.  Screening for cardiovascular condition - Plan: EKG 12-Lead  -Discussed screening EKG as has not had that recently. Initially agreed to EKG, then later in visit refused. Asymptomatic  Constipation, unspecified constipation type  -Increase water intake discussed and handout given on constipation. RTC precautions if persistent or worsening symptoms  Tobacco abuse  -Cessation discussed. Did note occasional shortness of breath with exertion. Some signs of emphysema were seen on recent CT.  Advised for him to return if any recurrence of wheezing or shortness of breath but ultimately quitting smoking most important initial step.  No orders of the defined types were placed in this encounter.  Patient Instructions   See information on constipation below. Increase water intake, less caffeine to help with hydration and constipation. Return to the clinic or go to the nearest emergency room if any of your symptoms worsen or new symptoms occur.  If it has been greater than 10 years since last colonoscopy.  I would like to refer you, so let me know as soon as I can do that after you check into costs.   Recent ct scan without apparent lung cancer - repeat that test next year. Some signs of emphysema seen.  If any wheezing or shortness of breath, please return to discuss medications. Quitting smoking is very important for your health - let me know if I can help you quit.   We can check an EKG at next visit if you would like, as well as prostate cancer screening at that time as well. Cholesterol was elevated in December.  Continue walking for exercise, watch diet, and recheck cholesterol in the next 3 months. If not improved at that time - would recommend statin medication.   Steps to Quit Smoking Smoking tobacco can be harmful to your health and can affect almost every organ in your body. Smoking puts you, and those around you, at risk for developing many serious chronic diseases. Quitting smoking is difficult, but it is one of the best things that you can do for your health. It is never too late to quit. What are the benefits of quitting smoking? When you quit smoking, you lower your risk of developing serious diseases and conditions, such as:  Lung cancer or lung disease, such as COPD.  Heart disease.  Stroke.  Heart attack.  Infertility.  Osteoporosis and bone fractures.  Additionally, symptoms such as coughing, wheezing, and shortness of breath may get better when you quit. You  may also find that you get sick less often because your body is stronger at fighting off colds and infections. If you are pregnant, quitting smoking can help to reduce your chances of having a baby of low birth weight. How do I get ready to quit?  When you decide to quit smoking, create a plan to make sure that you are successful. Before you quit:  Pick a date to quit. Set a date within the next two weeks to give you time to prepare.  Write down the reasons why you are quitting. Keep this list in places where you will see it often, such as on your bathroom mirror or in your car or wallet.  Identify the people, places, things, and activities that make you want to smoke (triggers) and avoid them. Make sure to take these actions: ? Throw away all cigarettes at home, at work, and in your car. ? Throw away smoking accessories, such as Scientist, research (medical). ? Clean your car and make sure to empty the ashtray. ? Clean your home, including curtains and carpets.  Tell your family, friends, and coworkers that you are quitting. Support from your loved ones can make quitting easier.  Talk with your health care provider about your options for quitting smoking.  Find out what treatment options are covered by your health insurance.  What strategies can I use to quit smoking? Talk with your healthcare provider about different strategies to quit smoking. Some strategies include:  Quitting smoking altogether instead of gradually lessening how much you smoke over a period of time. Research shows that quitting "cold [place]" is more successful than gradually quitting.  Attending in-person counseling to help you build problem-solving skills. You are more likely to have success in quitting if you attend several counseling sessions. Even short sessions of 10 minutes can be effective.  Finding resources and support systems that can help you to quit smoking and remain smoke-free after you quit. These resources are  most helpful when you use them often. They can include: ? Online chats with a Social worker. ? Telephone quitlines. ? Careers information officer. ? Support groups or group counseling. ? Text messaging programs. ? Mobile phone applications.  Taking medicines to help you quit smoking. (If you are pregnant or breastfeeding, talk with your health care provider first.) Some medicines contain nicotine and some do not. Both types of medicines help with cravings, but the medicines that include nicotine help to relieve withdrawal symptoms. Your health care provider may recommend: ? Nicotine patches, gum, or lozenges. ? Nicotine inhalers or sprays. ? Non-nicotine medicine that is taken by mouth.  Talk with your health care provider about combining strategies, such as taking medicines while you are also receiving in-person counseling. Using these two strategies together makes you more likely to succeed in quitting than if you used either strategy on its own. If you are pregnant or breastfeeding, talk with your health care provider about finding counseling or other support strategies to quit smoking. Do not take medicine to help you quit smoking unless told to do so by your health care provider. What things can I do to make it easier to quit? Quitting smoking might feel overwhelming at first, but there is a lot that you can do to make it easier. Take these important actions:  Reach out to your family and friends and ask that they support and encourage you during this time. Call telephone quitlines, reach out to support groups, or work with a counselor for support.  Ask people who smoke to avoid smoking around you.  Avoid places that trigger you to smoke, such as bars, parties, or smoke-break areas at work.  Spend time around people who do not smoke.  Lessen stress in your life, because stress can be a  smoking trigger for some people. To lessen stress, try: ? Exercising regularly. ? Deep-breathing  exercises. ? Yoga. ? Meditating. ? Performing a body scan. This involves closing your eyes, scanning your body from head to toe, and noticing which parts of your body are particularly tense. Purposefully relax the muscles in those areas.  Download or purchase mobile phone or tablet apps (applications) that can help you stick to your quit plan by providing reminders, tips, and encouragement. There are many free apps, such as QuitGuide from the [employer] Office manager for Disease Control and Prevention). You can find other support for quitting smoking (smoking cessation) through smokefree.gov and other websites.  How will I feel when I quit smoking? Within the first 24 hours of quitting smoking, you may start to feel some withdrawal symptoms. These symptoms are usually most noticeable 2-3 days after quitting, but they usually do not last beyond 2-3 weeks. Changes or symptoms that you might experience include:  Mood swings.  Restlessness, anxiety, or irritation.  Difficulty concentrating.  Dizziness.  Strong cravings for sugary foods in addition to nicotine.  Mild weight gain.  Constipation.  Nausea.  Coughing or a sore throat.  Changes in how your medicines work in your body.  A depressed mood.  Difficulty sleeping (insomnia).  After the first 2-3 weeks of quitting, you may start to notice more positive results, such as:  Improved sense of smell and taste.  Decreased coughing and sore throat.  Slower heart rate.  Lower blood pressure.  Clearer skin.  The ability to breathe more easily.  Fewer sick days.  Quitting smoking is very challenging for most people. Do not get discouraged if you are not successful the first time. Some people need to make many attempts to quit before they achieve long-term success. Do your best to stick to your quit plan, and talk with your health care provider if you have any questions or concerns. This information is not intended to replace advice given  to you by your health care provider. Make sure you discuss any questions you have with your health care provider. Document Released: 09/19/2001 Document Revised: 05/23/2016 Document Reviewed: 02/09/2015 Elsevier Interactive Patient Education  2018 [employer].  Constipation, Adult Constipation is when a person has fewer bowel movements in a week than normal, has difficulty having a bowel movement, or has stools that are dry, hard, or larger than normal. Constipation may be caused by an underlying condition. It may become worse with age if a person takes certain medicines and does not take in enough fluids. Follow these instructions at home: Eating and drinking   Eat foods that have a lot of fiber, such as fresh fruits and vegetables, whole grains, and beans.  Limit foods that are high in fat, low in fiber, or overly processed, such as french fries, hamburgers, cookies, candies, and soda.  Drink enough fluid to keep your urine clear or pale yellow. General instructions  Exercise regularly or as told by your health care provider.  Go to the restroom when you have the urge to go. Do not hold it in.  Take over-the-counter and prescription medicines only as told by your health care provider. These include any fiber supplements.  Practice pelvic floor retraining exercises, such as deep breathing while relaxing the lower abdomen and pelvic floor relaxation during bowel movements.  Watch your condition for any changes.  Keep all follow-up visits as told by your health care provider. This is important. Contact a health care provider if:  You have pain that gets worse.  You have a fever.  You do not have a bowel movement after 4 days.  You vomit.  You are not hungry.  You lose weight.  You are bleeding from the anus.  You have thin, pencil-like stools. Get help right away if:  You have a fever and your symptoms suddenly get worse.  You leak stool or have blood in your  stool.  Your abdomen is bloated.  You have severe pain in your abdomen.  You feel dizzy or you faint. This information is not intended to replace advice given to you by your health care provider. Make sure you discuss any questions you have with your health care provider. Document Released: 06/23/2004 Document Revised: 04/14/2016 Document Reviewed: 03/15/2016 Elsevier Interactive Patient Education  2018 [place] 65 Years and Older, Male Preventive care refers to lifestyle choices and visits with your health care provider that can promote health and wellness. What does preventive care include?  A yearly physical exam. This is also called an annual well check.  Dental exams once or twice a year.  Routine eye exams. Ask your health care provider how often you should have your eyes checked.  Personal lifestyle choices, including: ? Daily care of your teeth and gums. ? Regular physical activity. ? Eating a healthy diet. ? Avoiding tobacco and drug use. ? Limiting alcohol use. ? Practicing safe sex. ? Taking low doses of aspirin every day. ? Taking vitamin and mineral supplements as recommended by your health care provider. What happens during an annual well check? The services and screenings done by your health care provider during your annual well check will depend on your age, overall health, lifestyle risk factors, and family history of disease. Counseling Your health care provider may ask you questions about your:  Alcohol use.  Tobacco use.  Drug use.  Emotional well-being.  Home and relationship well-being.  Sexual activity.  Eating habits.  History of falls.  Memory and ability to understand (cognition).  Work and work Statistician.  Screening You may have the following tests or measurements:  Height, weight, and BMI.  Blood pressure.  Lipid and cholesterol levels. These may be checked every 5 years, or more frequently if you are  over 3 years old.  Skin check.  Lung cancer screening. You may have this screening every year starting at age 25 if you have a 30-pack-year history of smoking and currently smoke or have quit within the past 15 years.  Fecal occult blood test (FOBT) of the stool. You may have this test every year starting at age 65.  Flexible sigmoidoscopy or colonoscopy. You may have a sigmoidoscopy every 5 years or a colonoscopy every 10 years starting at age 64.  Prostate cancer screening. Recommendations will vary depending on your family history and other risks.  Hepatitis C blood test.  Hepatitis B blood test.  Sexually transmitted disease (STD) testing.  Diabetes screening. This is done by checking your blood sugar (glucose) after you have not eaten for a while (fasting). You may have this done every 1-3 years.  Abdominal aortic aneurysm (AAA) screening. You may need this if you are a current or former smoker.  Osteoporosis. You may be screened starting at age 100 if you are at high risk.  Talk with your health care provider about your test results, treatment options, and if necessary, the need for more tests. Vaccines Your health care provider may recommend certain vaccines,  such as:  Influenza vaccine. This is recommended every year.  Tetanus, diphtheria, and acellular pertussis (Tdap, Td) vaccine. You may need a Td booster every 10 years.  Varicella vaccine. You may need this if you have not been vaccinated.  Zoster vaccine. You may need this after age 58.  Measles, mumps, and rubella (MMR) vaccine. You may need at least one dose of MMR if you were born in 1957 or later. You may also need a second dose.  Pneumococcal 13-valent conjugate (PCV13) vaccine. One dose is recommended after age 99.  Pneumococcal polysaccharide (PPSV23) vaccine. One dose is recommended after age 54.  Meningococcal vaccine. You may need this if you have certain conditions.  Hepatitis A vaccine. You may  need this if you have certain conditions or if you travel or work in places where you may be exposed to hepatitis A.  Hepatitis B vaccine. You may need this if you have certain conditions or if you travel or work in places where you may be exposed to hepatitis B.  Haemophilus influenzae type b (Hib) vaccine. You may need this if you have certain risk factors.  Talk to your health care provider about which screenings and vaccines you need and how often you need them. This information is not intended to replace advice given to you by your health care provider. Make sure you discuss any questions you have with your health care provider. Document Released: 10/22/2015 Document Revised: 06/14/2016 Document Reviewed: 07/27/2015 Elsevier Interactive Patient Education  2018 [employer].   IF you received an x-ray today, you will receive an invoice from [facility]. Please contact [facility] at [phone] with questions or concerns regarding your invoice.   IF you received labwork today, you will receive an invoice from [employer]. Please contact [facility] at [phone] with questions or concerns regarding your invoice.   Our billing staff will not be able to assist you with questions regarding bills from these companies.  You will be contacted with the lab results as soon as they are available. The fastest way to get your results is to activate your My Chart account. Instructions are located on the last page of this paperwork. If you have not heard from [place] regarding the results in 2 weeks, please contact this office.       I personally performed the services described in this documentation, which was scribed in my presence. The recorded information has been reviewed and considered for accuracy and completeness, addended by me as needed, and agree with information above.  Signed,   Merri Ray, MD Primary [facility] at [facility].  10/24/17 3:50 PM

## 2017-10-22 NOTE — Patient Instructions (Addendum)
See information on constipation below. Increase water intake, less caffeine to help with hydration and constipation. Return to the clinic or go to the nearest emergency room if any of your symptoms worsen or new symptoms occur.  If it has been greater than 10 years since last colonoscopy.  I would like to refer you, so let me know as soon as I can do that after you check into costs.   Recent ct scan without apparent lung cancer - repeat that test next year. Some signs of emphysema seen.  If any wheezing or shortness of breath, please return to discuss medications. Quitting smoking is very important for your health - let me know if I can help you quit.   We can check an EKG at next visit if you would like, as well as prostate cancer screening at that time as well. Cholesterol was elevated in December.  Continue walking for exercise, watch diet, and recheck cholesterol in the next 3 months. If not improved at that time - would recommend statin medication.   Steps to Quit Smoking Smoking tobacco can be harmful to your health and can affect almost every organ in your body. Smoking puts you, and those around you, at risk for developing many serious chronic diseases. Quitting smoking is difficult, but it is one of the best things that you can do for your health. It is never too late to quit. What are the benefits of quitting smoking? When you quit smoking, you lower your risk of developing serious diseases and conditions, such as:  Lung cancer or lung disease, such as COPD.  Heart disease.  Stroke.  Heart attack.  Infertility.  Osteoporosis and bone fractures.  Additionally, symptoms such as coughing, wheezing, and shortness of breath may get better when you quit. You may also find that you get sick less often because your body is stronger at fighting off colds and infections. If you are pregnant, quitting smoking can help to reduce your chances of having a baby of low birth weight. How do I get  ready to quit? When you decide to quit smoking, create a plan to make sure that you are successful. Before you quit:  Pick a date to quit. Set a date within the next two weeks to give you time to prepare.  Write down the reasons why you are quitting. Keep this list in places where you will see it often, such as on your bathroom mirror or in your car or wallet.  Identify the people, places, things, and activities that make you want to smoke (triggers) and avoid them. Make sure to take these actions: ? Throw away all cigarettes at home, at work, and in your car. ? Throw away smoking accessories, such as Scientist, research (medical). ? Clean your car and make sure to empty the ashtray. ? Clean your home, including curtains and carpets.  Tell your family, friends, and coworkers that you are quitting. Support from your loved ones can make quitting easier.  Talk with your health care provider about your options for quitting smoking.  Find out what treatment options are covered by your health insurance.  What strategies can I use to quit smoking? Talk with your healthcare provider about different strategies to quit smoking. Some strategies include:  Quitting smoking altogether instead of gradually lessening how much you smoke over a period of time. Research shows that quitting "cold [place]" is more successful than gradually quitting.  Attending in-person counseling to help you build problem-solving skills.  You are more likely to have success in quitting if you attend several counseling sessions. Even short sessions of 10 minutes can be effective.  Finding resources and support systems that can help you to quit smoking and remain smoke-free after you quit. These resources are most helpful when you use them often. They can include: ? Online chats with a Social worker. ? Telephone quitlines. ? Careers information officer. ? Support groups or group counseling. ? Text messaging programs. ? Mobile phone  applications.  Taking medicines to help you quit smoking. (If you are pregnant or breastfeeding, talk with your health care provider first.) Some medicines contain nicotine and some do not. Both types of medicines help with cravings, but the medicines that include nicotine help to relieve withdrawal symptoms. Your health care provider may recommend: ? Nicotine patches, gum, or lozenges. ? Nicotine inhalers or sprays. ? Non-nicotine medicine that is taken by mouth.  Talk with your health care provider about combining strategies, such as taking medicines while you are also receiving in-person counseling. Using these two strategies together makes you more likely to succeed in quitting than if you used either strategy on its own. If you are pregnant or breastfeeding, talk with your health care provider about finding counseling or other support strategies to quit smoking. Do not take medicine to help you quit smoking unless told to do so by your health care provider. What things can I do to make it easier to quit? Quitting smoking might feel overwhelming at first, but there is a lot that you can do to make it easier. Take these important actions:  Reach out to your family and friends and ask that they support and encourage you during this time. Call telephone quitlines, reach out to support groups, or work with a counselor for support.  Ask people who smoke to avoid smoking around you.  Avoid places that trigger you to smoke, such as bars, parties, or smoke-break areas at work.  Spend time around people who do not smoke.  Lessen stress in your life, because stress can be a smoking trigger for some people. To lessen stress, try: ? Exercising regularly. ? Deep-breathing exercises. ? Yoga. ? Meditating. ? Performing a body scan. This involves closing your eyes, scanning your body from head to toe, and noticing which parts of your body are particularly tense. Purposefully relax the muscles in those  areas.  Download or purchase mobile phone or tablet apps (applications) that can help you stick to your quit plan by providing reminders, tips, and encouragement. There are many free apps, such as QuitGuide from the [employer] Office manager for Disease Control and Prevention). You can find other support for quitting smoking (smoking cessation) through smokefree.gov and other websites.  How will I feel when I quit smoking? Within the first 24 hours of quitting smoking, you may start to feel some withdrawal symptoms. These symptoms are usually most noticeable 2-3 days after quitting, but they usually do not last beyond 2-3 weeks. Changes or symptoms that you might experience include:  Mood swings.  Restlessness, anxiety, or irritation.  Difficulty concentrating.  Dizziness.  Strong cravings for sugary foods in addition to nicotine.  Mild weight gain.  Constipation.  Nausea.  Coughing or a sore throat.  Changes in how your medicines work in your body.  A depressed mood.  Difficulty sleeping (insomnia).  After the first 2-3 weeks of quitting, you may start to notice more positive results, such as:  Improved sense of smell and taste.  Decreased coughing and sore throat.  Slower heart rate.  Lower blood pressure.  Clearer skin.  The ability to breathe more easily.  Fewer sick days.  Quitting smoking is very challenging for most people. Do not get discouraged if you are not successful the first time. Some people need to make many attempts to quit before they achieve long-term success. Do your best to stick to your quit plan, and talk with your health care provider if you have any questions or concerns. This information is not intended to replace advice given to you by your health care provider. Make sure you discuss any questions you have with your health care provider. Document Released: 09/19/2001 Document Revised: 05/23/2016 Document Reviewed: 02/09/2015 Elsevier Interactive Patient  Education  2018 [employer].  Constipation, Adult Constipation is when a person has fewer bowel movements in a week than normal, has difficulty having a bowel movement, or has stools that are dry, hard, or larger than normal. Constipation may be caused by an underlying condition. It may become worse with age if a person takes certain medicines and does not take in enough fluids. Follow these instructions at home: Eating and drinking   Eat foods that have a lot of fiber, such as fresh fruits and vegetables, whole grains, and beans.  Limit foods that are high in fat, low in fiber, or overly processed, such as french fries, hamburgers, cookies, candies, and soda.  Drink enough fluid to keep your urine clear or pale yellow. General instructions  Exercise regularly or as told by your health care provider.  Go to the restroom when you have the urge to go. Do not hold it in.  Take over-the-counter and prescription medicines only as told by your health care provider. These include any fiber supplements.  Practice pelvic floor retraining exercises, such as deep breathing while relaxing the lower abdomen and pelvic floor relaxation during bowel movements.  Watch your condition for any changes.  Keep all follow-up visits as told by your health care provider. This is important. Contact a health care provider if:  You have pain that gets worse.  You have a fever.  You do not have a bowel movement after 4 days.  You vomit.  You are not hungry.  You lose weight.  You are bleeding from the anus.  You have thin, pencil-like stools. Get help right away if:  You have a fever and your symptoms suddenly get worse.  You leak stool or have blood in your stool.  Your abdomen is bloated.  You have severe pain in your abdomen.  You feel dizzy or you faint. This information is not intended to replace advice given to you by your health care provider. Make sure you discuss any questions  you have with your health care provider. Document Released: 06/23/2004 Document Revised: 04/14/2016 Document Reviewed: 03/15/2016 Elsevier Interactive Patient Education  2018 [place] 65 Years and Older, Male Preventive care refers to lifestyle choices and visits with your health care provider that can promote health and wellness. What does preventive care include?  A yearly physical exam. This is also called an annual well check.  Dental exams once or twice a year.  Routine eye exams. Ask your health care provider how often you should have your eyes checked.  Personal lifestyle choices, including: ? Daily care of your teeth and gums. ? Regular physical activity. ? Eating a healthy diet. ? Avoiding tobacco and drug use. ? Limiting alcohol use. ?  Practicing safe sex. ? Taking low doses of aspirin every day. ? Taking vitamin and mineral supplements as recommended by your health care provider. What happens during an annual well check? The services and screenings done by your health care provider during your annual well check will depend on your age, overall health, lifestyle risk factors, and family history of disease. Counseling Your health care provider may ask you questions about your:  Alcohol use.  Tobacco use.  Drug use.  Emotional well-being.  Home and relationship well-being.  Sexual activity.  Eating habits.  History of falls.  Memory and ability to understand (cognition).  Work and work Statistician.  Screening You may have the following tests or measurements:  Height, weight, and BMI.  Blood pressure.  Lipid and cholesterol levels. These may be checked every 5 years, or more frequently if you are over 59 years old.  Skin check.  Lung cancer screening. You may have this screening every year starting at age 74 if you have a 30-pack-year history of smoking and currently smoke or have quit within the past 15 years.  Fecal occult  blood test (FOBT) of the stool. You may have this test every year starting at age 66.  Flexible sigmoidoscopy or colonoscopy. You may have a sigmoidoscopy every 5 years or a colonoscopy every 10 years starting at age 33.  Prostate cancer screening. Recommendations will vary depending on your family history and other risks.  Hepatitis C blood test.  Hepatitis B blood test.  Sexually transmitted disease (STD) testing.  Diabetes screening. This is done by checking your blood sugar (glucose) after you have not eaten for a while (fasting). You may have this done every 1-3 years.  Abdominal aortic aneurysm (AAA) screening. You may need this if you are a current or former smoker.  Osteoporosis. You may be screened starting at age 32 if you are at high risk.  Talk with your health care provider about your test results, treatment options, and if necessary, the need for more tests. Vaccines Your health care provider may recommend certain vaccines, such as:  Influenza vaccine. This is recommended every year.  Tetanus, diphtheria, and acellular pertussis (Tdap, Td) vaccine. You may need a Td booster every 10 years.  Varicella vaccine. You may need this if you have not been vaccinated.  Zoster vaccine. You may need this after age 34.  Measles, mumps, and rubella (MMR) vaccine. You may need at least one dose of MMR if you were born in 1957 or later. You may also need a second dose.  Pneumococcal 13-valent conjugate (PCV13) vaccine. One dose is recommended after age 65.  Pneumococcal polysaccharide (PPSV23) vaccine. One dose is recommended after age 11.  Meningococcal vaccine. You may need this if you have certain conditions.  Hepatitis A vaccine. You may need this if you have certain conditions or if you travel or work in places where you may be exposed to hepatitis A.  Hepatitis B vaccine. You may need this if you have certain conditions or if you travel or work in places where you may be  exposed to hepatitis B.  Haemophilus influenzae type b (Hib) vaccine. You may need this if you have certain risk factors.  Talk to your health care provider about which screenings and vaccines you need and how often you need them. This information is not intended to replace advice given to you by your health care provider. Make sure you discuss any questions you have with your health care provider.  Document Released: 10/22/2015 Document Revised: 06/14/2016 Document Reviewed: 07/27/2015 Elsevier Interactive Patient Education  2018 [employer].   IF you received an x-ray today, you will receive an invoice from [facility]. Please contact [facility] at [phone] with questions or concerns regarding your invoice.   IF you received labwork today, you will receive an invoice from [facility]. Please contact [facility] at [phone] with questions or concerns regarding your invoice.   Our billing staff will not be able to assist you with questions regarding bills from these companies.  You will be contacted with the lab results as soon as they are available. The fastest way to get your results is to activate your My Chart account. Instructions are located on the last page of this paperwork. If you have not heard from [place] regarding the results in 2 weeks, please contact this office.

## 2017-10-26 ENCOUNTER — Other Ambulatory Visit: Payer: Self-pay | Admitting: Acute Care

## 2017-10-26 DIAGNOSIS — Z122 Encounter for screening for malignant neoplasm of respiratory organs: Secondary | ICD-10-CM

## 2017-10-26 DIAGNOSIS — F17210 Nicotine dependence, cigarettes, uncomplicated: Principal | ICD-10-CM

## 2017-11-14 ENCOUNTER — Telehealth: Payer: Self-pay | Admitting: Neurology

## 2017-11-14 MED ORDER — AMPHETAMINE-DEXTROAMPHETAMINE 20 MG PO TABS: 20.0000 mg | ORAL_TABLET | Freq: Two times a day (BID) | ORAL | 0 refills | Status: DC

## 2017-11-14 NOTE — Telephone Encounter (Signed)
Rx. up front [facility]/fim 

## 2017-11-14 NOTE — Telephone Encounter (Signed)
Pt is requesting a refill for amphetamine-dextroamphetamine (ADDERALL) 20 MG tablet (pt only takes 1 a day if pt takes 2 he isnt able to sleep)

## 2017-11-14 NOTE — Telephone Encounter (Signed)
Rx. awaiting RAS sig/fim 

## 2017-12-17 ENCOUNTER — Other Ambulatory Visit: Payer: Self-pay

## 2017-12-17 ENCOUNTER — Telehealth: Payer: Self-pay | Admitting: Neurology

## 2017-12-17 ENCOUNTER — Ambulatory Visit (INDEPENDENT_AMBULATORY_CARE_PROVIDER_SITE_OTHER): Payer: Medicare Other | Admitting: Neurology

## 2017-12-17 ENCOUNTER — Encounter (INDEPENDENT_AMBULATORY_CARE_PROVIDER_SITE_OTHER): Payer: Self-pay

## 2017-12-17 ENCOUNTER — Encounter: Payer: Self-pay | Admitting: Neurology

## 2017-12-17 VITALS — BP 136/86 | HR 84 | Resp 18 | Ht 72.84 in | Wt 206.0 lb

## 2017-12-17 DIAGNOSIS — R350 Frequency of micturition: Secondary | ICD-10-CM | POA: Diagnosis not present

## 2017-12-17 DIAGNOSIS — R3911 Hesitancy of micturition: Secondary | ICD-10-CM

## 2017-12-17 DIAGNOSIS — R200 Anesthesia of skin: Secondary | ICD-10-CM

## 2017-12-17 DIAGNOSIS — F09 Unspecified mental disorder due to known physiological condition: Secondary | ICD-10-CM

## 2017-12-17 DIAGNOSIS — G35 Multiple sclerosis: Secondary | ICD-10-CM | POA: Diagnosis not present

## 2017-12-17 DIAGNOSIS — Z79899 Other long term (current) drug therapy: Secondary | ICD-10-CM

## 2017-12-17 DIAGNOSIS — R41840 Attention and concentration deficit: Secondary | ICD-10-CM | POA: Diagnosis not present

## 2017-12-17 DIAGNOSIS — R269 Unspecified abnormalities of gait and mobility: Secondary | ICD-10-CM | POA: Diagnosis not present

## 2017-12-17 MED ORDER — TAMSULOSIN HCL 0.4 MG PO CAPS: 0.4000 mg | ORAL_CAPSULE | Freq: Every day | ORAL | 3 refills | Status: DC

## 2017-12-17 MED ORDER — AMPHETAMINE-DEXTROAMPHETAMINE 20 MG PO TABS: 20.0000 mg | ORAL_TABLET | Freq: Two times a day (BID) | ORAL | 0 refills | Status: DC

## 2017-12-17 NOTE — Telephone Encounter (Signed)
12/17/17 Medicare order sent to [facility]

## 2017-12-17 NOTE — Progress Notes (Signed)
[facility]  PATIENT: Gabriel Hanson DOB: 06-02-1951  REFERRING DOCTOR OR PCP:  None  SOURCE: patient  _________________________________   HISTORICAL  CHIEF COMPLAINT:  Chief Complaint  Patient presents with  . Multiple Sclerosis    Has been out of Tecfidera for 1-2 weeks, was unsure how to reorder this, and didn't call the office to ask. Is only taking Adderall QAM, b/c the afternoon dose kept him up at night./fim    HISTORY OF PRESENT ILLNESS:  Gabriel Hanson is a 67 yo man with MS.       Update 12/17/2017:   He ran out of Tecfidera about 2 weeks ago.   He denies any exacerbations or other new symptoms.  He was tolerating Tecfidera well.  His gait is a little wide due to leg weakness and stiffness.  Stiffness is worse upon standing up after sitting a while.    He has some tingling in his feet.     He has some urinary hesitancy.    Flomax has helped.     His vision is usually good but he notes blurriness when he is tired.    Adderall helps his cognition, especially focus and attention.  He is completing tasks more easily.   His fatigue may help the memory and cognition.   He seldom takes the pm dose as sleep was affected,   Sleep is good most nights without the second dose.   Marland Kitchen    Update 08/16/2017:   He feels his MS has been mostly stable. He is on Tecfidera. He tolerates it well.    He denies new issues with gait, strength or sensation.   His left leg is more affected from MS and right leg from ortho issues.  Both legs seem stiff and he takes a few extra seconds to stand up.     There is stable tingling in his toes that is not painful.      Bladder issues are worse but he ran out of Flomax.     He notes some visual blurring especially when he is tired. But this is stable.  He has fatigue and the Ritalin has not helped.   In the past it helped cognition but more recently it did not help at all.    Cognition continues to be a major problem.   He has trouble with  focus/attnetion, STM, verbal fluency.    He is sleeping fairly well at night.  Aleve has helped the leg pain.     ________________________________________________ From 04/10/2017: MS:    He was placed on Tecfidera are in 2015. Initially, he had good compliance but then in 2017 compliance was very poor and we considered a switch to Tysabri. However, the JCV Ab was middle positive at 0.99 and he did not start.   He would like to get back on Tecfidera.   Gait/strength/sensation:   He continues to note some difficulty with his gait. It is clumsy and he feels the left leg gives out at times. He was walking 5 miles a day but more recently is having difficulty walking 1 mile.  He also has some tingling in the feet bilaterally.   The arms have normal strength and sensation.    Vision:  He is noting more difficulty with his vision. He had optic neuritis on the right in the past and that eye has never been as strong as the left. However, more recently, the left eye also is having more difficulty.  Colors seem ok.    He had an episode of diplopia x 2 weeks many years ago and used an eye patch while driving.  No recent diplopia or eye pain now.    Bladder/bowel:   Urinary hesitancy was improved by tamsulosin. He noted a big difference when he went off the medication for a few days..   He has constipation with painful BM's only 1 -2 times a week. MiraLAX did not help at first then caused diarrhea.     Fatigue/Attention/Sleep:   He reports physical and mental fatigue.   He had ADD as a child and has had attentional problems.   He felt Ritalin helped his mental fog and attention more than his fatigue.     He sleeps well most nights having insomnia rarely.     He remains active and walks daily.     Mood:   He notes mild depression and he has a history of a severe major depression. He does not have any suicidal ideation at this time though he did in the past..   Occasionally, he might laugh if sad or cry if happy and  is also had some tearful episodes  Cognition:   He has had difficulties with focus and attention. Ritalin has helped but he ran out. He felt he was able to get much more complex during the day.       B12:   His B12 was low and he is receiving IM injections and feels it has helpd fatigue and vision.  .   When last checked 02/14/2016, B12 was no longer low.  Knee pain:   He has had right knee pain due to a torn meniscus and now notes similar pain on his left.    MS History:  He was diagnosed with MS in 2001 when he presented with right visual loss.     He had an MRI worrisome for MS.   He was placed on IV steroids and vision got 80% better.    He was placed on an interferon first and then Copaxone because he had tolerability issues.     For two years, he was without insurance and stopped all DMT.   I started seeing him in 2015 and we started Tecfidera.   He tolerates it well but sometimes forgets the second pill because all his medications are qAM.  MRI brain 2011 shows white matter foci in the periventricular and juxtacortical white matter consistent with the clinical diagnosis of MS. The right optic nerve was reduced in size. There w was one focus in the right pons and one in the left cerebellar hemisphere, as well. None of the foci were enhancing during that MRI.    REVIEW OF SYSTEMS: Constitutional: No fevers, chills, sweats, or change in appetite.  He notes some fatigue. Occasional insomnia. Eyes: No visual changes, double vision, eye pain Ear, nose and throat: No hearing loss, ear pain, nasal congestion, sore throat Cardiovascular: No chest pain, palpitations Respiratory: No shortness of breath at rest or with exertion.   No wheezes GastrointestinaI: No nausea, vomiting, diarrhea, abdominal pain, fecal incontinence.  Severe constipation Genitourinary: as above.  Musculoskeletal: No neck pain, back pain Integumentary: No rash, pruritus, skin lesions Neurological: as above Psychiatric:  Mild depression. Cognitive decline. Endocrine: No palpitations, diaphoresis, change in appetite, change in weigh or increased thirst Hematologic/Lymphatic: No anemia, purpura, petechiae. Allergic/Immunologic: No itchy/runny eyes, nasal congestion, recent allergic reactions, rashes  ALLERGIES: No Known Allergies  HOME MEDICATIONS:  Current  Outpatient Medications:  .  amphetamine-dextroamphetamine (ADDERALL) 20 MG tablet, Take 1 tablet (20 mg total) by mouth 2 (two) times daily., Disp: 60 tablet, Rfl: 0 .  tamsulosin (FLOMAX) 0.4 MG CAPS capsule, Take 1 capsule (0.4 mg total) by mouth daily., Disp: 90 capsule, Rfl: 3 .  Dimethyl Fumarate 240 MG CPDR, Take 240 mg by mouth 2 (two) times daily., Disp: , Rfl:   PAST MEDICAL HISTORY: Past Medical History:  Diagnosis Date  . Depressive disorder, not elsewhere classified   . Disorder of eye, unspecified   . Memory loss   . Multiple sclerosis (HCC)   . 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 Other psoriasis   . Psychosexual dysfunction, unspecified     PAST SURGICAL HISTORY: Past Surgical History:  Procedure Laterality Date  . KNEE ARTHROSCOPY     right   . TONSILLECTOMY      FAMILY HISTORY: Family History  Problem Relation Age of Onset  . Breast cancer Mother   . COPD Father   . Heart disease Unknown     SOCIAL HISTORY:  Social History   Socioeconomic History  . Marital status: Single    Spouse name: Not on file  . Number of children: 0  . Years of education: Not on file  . Highest education level: Bachelor's degree (e.g., BA, AB, BS)  Social Needs  . Financial resource strain: Not very hard  . Food insecurity - worry: Often true  . Food insecurity - inability: Often true  . Transportation needs - medical: No  . Transportation needs - non-medical: No  Occupational History  . Occupation: Energy manager: [employer]    Comment: Fulltime  Tobacco Use  . Smoking  status: Heavy Tobacco Smoker    Packs/day: 1.00    Years: 44.00    Pack years: 44.00    Types: Cigarettes  . Smokeless tobacco: Never Used  Substance and Sexual Activity  . Alcohol use: Yes    Alcohol/week: 0.0 oz    Comment: Occasionally/fim  . Drug use: Yes    Types: Marijuana  . Sexual activity: Not on file  Other Topics Concern  . Not on file  Social History Narrative  . Not on file     PHYSICAL EXAM  Vitals:   12/17/17 1111  BP: 136/86  Pulse: 84  Resp: 18  Weight: 206 lb (93.4 kg)  Height: 6' 0.84" (1.85 m)    Body mass index is 27.3 kg/m.   General: The patient is well-developed and well-nourished and in no acute distress  Neurologic Exam  Mental status: The patient is alert and oriented x 3 at the time of the examination. The patient has apparent normal recent and remote memory, with an apparently normal  concentration ability but some distractibility.   Speech is normal.  Cranial nerves: Extraocular movements are full. He has a 1+ APD on the right and color vision is mildly desaturated on the right. Facial strength and sensation is normal. Trapezius strength is normal. The tongue is midline, and the patient has symmetric elevation of the soft palate. No obvious hearing deficits are noted.  Motor:  Muscle bulk is normal.   Tone is normal. Strength is  5 / 5 in all 4 extremities.   Sensory: Sensation is normal in his arms but reduced in the left foot to touch  Coordination: Cerebellar testing shows good bilateral finger-nose-finger slightly but reduced bilateral heel-to-shin  Gait and station:  Station is normal.   The gait is mildly wide. Tandem gait is very wide. Romberg is negative.   Reflexes: Deep tendon reflexes are symmetric and mildly increased in the arms and knees. There is spread at the knees. There is no ankle clonus. Marland Kitchen       DIAGNOSTIC DATA (LABS, IMAGING, TESTING) - I reviewed patient records, labs, notes, testing and imaging myself where  available.  Lab Results  Component Value Date   WBC 6.0 08/16/2017   HGB 14.0 08/16/2017   HCT 41.1 08/16/2017   MCV 92 08/16/2017   PLT 239 08/16/2017      Component Value Date/Time   NA 142 09/28/2017 1000   K 4.4 09/28/2017 1000   CL 104 09/28/2017 1000   CO2 23 09/28/2017 1000   GLUCOSE 93 09/28/2017 1000   GLUCOSE 115 (H) 09/02/2012 1539   BUN 13 09/28/2017 1000   CREATININE 0.90 09/28/2017 1000   CALCIUM 9.4 09/28/2017 1000   PROT 6.8 09/28/2017 1000   ALBUMIN 4.6 09/28/2017 1000   AST 13 09/28/2017 1000   ALT 10 09/28/2017 1000   ALKPHOS 71 09/28/2017 1000   BILITOT 0.6 09/28/2017 1000   GFRNONAA 89 09/28/2017 1000   GFRAA 103 09/28/2017 1000       ASSESSMENT AND PLAN  MULTIPLE SCLEROSIS - Plan: CBC with Differential/Platelet, MR BRAIN W WO CONTRAST  Urinary frequency - Plan: tamsulosin (FLOMAX) 0.4 MG CAPS capsule  High risk medication use - Plan: CBC with Differential/Platelet  Urinary hesitancy  Gait disturbance  Numbness  Cognitive dysfunction  Attention deficit   1.   He was given the phone number to contact the Tecfidera program. He is advised to give [place] a call back if he is unable to get medications.. I'll check a CBC. 2.   Continue tamsulosin for urinary hesitancy.   Renew script.  3.  He is advised to stay active and exercises as tolerated. 4.   Renew Adderall 20 mg every morning and at lunch if needed.    5.   He will return to see me in 4-5 months or sooner if he has new or worsening neurologic symptoms.  Laith Antonelli A. Epimenio Foot, MD, PhD 12/17/2017, 12:47 PM Certified in Neurology, Clinical Neurophysiology, Sleep Medicine, Pain Medicine and Neuroimaging  [facility] [address], [address] [place], [place] [address] [phone]      MS History

## 2017-12-18 LAB — CBC WITH DIFFERENTIAL/PLATELET
BASOS: 1 %
Basophils Absolute: 0.1 10*3/uL (ref 0.0–0.2)
EOS (ABSOLUTE): 0.1 10*3/uL (ref 0.0–0.4)
EOS: 1 %
HEMATOCRIT: 45.6 % (ref 37.5–51.0)
HEMOGLOBIN: 15.7 g/dL (ref 13.0–17.7)
IMMATURE GRANS (ABS): 0 10*3/uL (ref 0.0–0.1)
IMMATURE GRANULOCYTES: 0 %
LYMPHS: 16 %
Lymphocytes Absolute: 1.2 10*3/uL (ref 0.7–3.1)
MCH: 31.8 pg (ref 26.6–33.0)
MCHC: 34.4 g/dL (ref 31.5–35.7)
MCV: 92 fL (ref 79–97)
MONOCYTES: 7 %
Monocytes Absolute: 0.5 10*3/uL (ref 0.1–0.9)
NEUTROS PCT: 75 %
Neutrophils Absolute: 5.3 10*3/uL (ref 1.4–7.0)
PLATELETS: 252 10*3/uL (ref 150–379)
RBC: 4.94 x10E6/uL (ref 4.14–5.80)
RDW: 13.7 % (ref 12.3–15.4)
WBC: 7.1 10*3/uL (ref 3.4–10.8)

## 2018-02-07 ENCOUNTER — Other Ambulatory Visit: Payer: Self-pay | Admitting: Neurology

## 2018-02-07 MED ORDER — AMPHETAMINE-DEXTROAMPHETAMINE 20 MG PO TABS: 20.0000 mg | ORAL_TABLET | Freq: Two times a day (BID) | ORAL | 0 refills | Status: DC

## 2018-02-07 NOTE — Telephone Encounter (Signed)
Pt has called for a refill on amphetamine-dextroamphetamine (ADDERALL) 20 MG tablet He is asking it be sent to [facility] location in [place], [place])

## 2018-02-07 NOTE — Addendum Note (Signed)
Addended by: Candis Schatz I on: 02/07/2018 01:31 PM   Modules accepted: Orders

## 2018-04-15 ENCOUNTER — Other Ambulatory Visit: Payer: Self-pay | Admitting: Neurology

## 2018-05-28 ENCOUNTER — Other Ambulatory Visit: Payer: Self-pay | Admitting: Neurology

## 2018-05-28 MED ORDER — AMPHETAMINE-DEXTROAMPHETAMINE 20 MG PO TABS: 20.0000 mg | ORAL_TABLET | Freq: Two times a day (BID) | ORAL | 0 refills | Status: DC

## 2018-05-28 NOTE — Telephone Encounter (Signed)
Pt request refill for amphetamine-dextroamphetamine (ADDERALL) 20 MG tablet sent to [id number] - [place], [place] - [address]

## 2018-06-19 ENCOUNTER — Ambulatory Visit: Payer: Medicare Other | Admitting: Neurology

## 2018-07-05 DIAGNOSIS — Z23 Encounter for immunization: Secondary | ICD-10-CM | POA: Diagnosis not present

## 2018-08-08 ENCOUNTER — Ambulatory Visit: Payer: Medicare Other | Admitting: Neurology

## 2018-08-09 ENCOUNTER — Encounter: Payer: Self-pay | Admitting: Neurology

## 2018-08-13 ENCOUNTER — Telehealth: Payer: Self-pay | Admitting: Neurology

## 2018-08-13 NOTE — Telephone Encounter (Signed)
I called pt back. R/s his appt scheduled for 11/2018 for sooner appt on 08/27/18 at 1:30pm. Advised he should discuss refill request for adderall at f/u. He verbalized understanding and appreciation. He states 20mg  tab was too strong. He was only able to tolerate 1 tablet daily. He has been out of medication. He wants to discuss lowering dose at f/u.

## 2018-08-13 NOTE — Telephone Encounter (Signed)
I called pt back. R/s his appt scheduled for 11/2018 for sooner appt on 08/27/18 at 1:30pm. Advised he should discuss refill request for adderall at f/u. He verbalized understanding and appreciation.

## 2018-08-13 NOTE — Telephone Encounter (Signed)
Pt called wanting to apologize for his missed appt, stating he had gotten on the wrong bus. FYI

## 2018-08-13 NOTE — Telephone Encounter (Signed)
Pt requesting refills for amphetamine-dextroamphetamine (ADDERALL) 20 MG tablet sent to [facility]

## 2018-08-27 ENCOUNTER — Encounter: Payer: Self-pay | Admitting: Neurology

## 2018-08-27 ENCOUNTER — Telehealth: Payer: Self-pay | Admitting: Neurology

## 2018-08-27 ENCOUNTER — Ambulatory Visit (INDEPENDENT_AMBULATORY_CARE_PROVIDER_SITE_OTHER): Payer: Medicare Other | Admitting: Neurology

## 2018-08-27 VITALS — BP 147/92 | HR 110 | Ht 72.0 in | Wt 197.5 lb

## 2018-08-27 DIAGNOSIS — Z79899 Other long term (current) drug therapy: Secondary | ICD-10-CM

## 2018-08-27 DIAGNOSIS — R41840 Attention and concentration deficit: Secondary | ICD-10-CM | POA: Diagnosis not present

## 2018-08-27 DIAGNOSIS — F09 Unspecified mental disorder due to known physiological condition: Secondary | ICD-10-CM

## 2018-08-27 DIAGNOSIS — R3911 Hesitancy of micturition: Secondary | ICD-10-CM | POA: Diagnosis not present

## 2018-08-27 DIAGNOSIS — G35 Multiple sclerosis: Secondary | ICD-10-CM | POA: Diagnosis not present

## 2018-08-27 DIAGNOSIS — R200 Anesthesia of skin: Secondary | ICD-10-CM | POA: Diagnosis not present

## 2018-08-27 DIAGNOSIS — R269 Unspecified abnormalities of gait and mobility: Secondary | ICD-10-CM

## 2018-08-27 MED ORDER — AMPHETAMINE-DEXTROAMPHETAMINE 10 MG PO TABS: ORAL_TABLET | ORAL | 0 refills | Status: DC

## 2018-08-27 NOTE — Progress Notes (Signed)
[facility]  PATIENT: Gabriel Hanson DOB: 1950/10/16  REFERRING DOCTOR OR PCP:  None  SOURCE: patient  _________________________________   HISTORICAL  CHIEF COMPLAINT:  Chief Complaint  Patient presents with  . Follow-up    RM 13, alone. Last seen 12/17/17.   . Multiple Sclerosis    On Tecfidera twice daily.  . Gait Problem    Unsteady gait  . Fatigue    Wants to discuss changing adderall dose from 20mg  to 10mg . He currently takes 20mg  daily.    HISTORY OF PRESENT ILLNESS:  Gabriel Hanson is a 67 y.o. man with MS.       Update 08/27/2018: He feels his MS is stable.    H is on Tecfidera and he tolerates it well.   He denies any exacerbation.     He feels gait is stable.    He has more tingling in his legs/feet.   He notes mildly more weakness and feels a little more off balanced,    No falls.  Also has a torn meniscus in right knee and wears a brace.  Stairs are difficult.     He has urinary frequency as well as hesitancy/ difficulty emptying and also has constipation.   Tamsulosin has helped him empty better but he has some leakage.   We discussed seeing urology.     Miralax helps constipation some.     He stopped driving as he felt he was no longer safe.    Focus/attention has ben an issue and he also has visual problems.   The Adderall has greatly helped the focus.   It has helped processing speed and multitasking as well.       He is on Tecfidera and he tolerates it well.    Update 12/17/2017:   He ran out of Tecfidera about 2 weeks ago.   He denies any exacerbations or other new symptoms.  He was tolerating Tecfidera well.  His gait is a little wide due to leg weakness and stiffness.  Stiffness is worse upon standing up after sitting a while.    He has some tingling in his feet.     He has some urinary hesitancy.    Flomax has helped.     His vision is usually good but he notes blurriness when he is tired.    Adderall helps his cognition, especially focus  and attention.  He is completing tasks more easily.   His fatigue may help the memory and cognition.   He seldom takes the pm dose as sleep was affected,   Sleep is good most nights without the second dose.   Marland Kitchen    Update 08/16/2017:   He feels his MS has been mostly stable. He is on Tecfidera. He tolerates it well.    He denies new issues with gait, strength or sensation.   His left leg is more affected from MS and right leg from ortho issues.  Both legs seem stiff and he takes a few extra seconds to stand up.     There is stable tingling in his toes that is not painful.      Bladder issues are worse but he ran out of Flomax.     He notes some visual blurring especially when he is tired. But this is stable.  He has fatigue and the Ritalin has not helped.   In the past it helped cognition but more recently it did not help at all.  Cognition continues to be a major problem.   He has trouble with focus/attnetion, STM, verbal fluency.    He is sleeping fairly well at night.  Aleve has helped the leg pain.     ________________________________________________ From 04/10/2017: MS:    He was placed on Tecfidera are in 2015. Initially, he had good compliance but then in 2017 compliance was very poor and we considered a switch to Tysabri. However, the JCV Ab was middle positive at 0.99 and he did not start.   He would like to get back on Tecfidera.   Gait/strength/sensation:   He continues to note some difficulty with his gait. It is clumsy and he feels the left leg gives out at times. He was walking 5 miles a day but more recently is having difficulty walking 1 mile.  He also has some tingling in the feet bilaterally.   The arms have normal strength and sensation.    Vision:  He is noting more difficulty with his vision. He had optic neuritis on the right in the past and that eye has never been as strong as the left. However, more recently, the left eye also is having more difficulty.    Colors seem ok.    He  had an episode of diplopia x 2 weeks many years ago and used an eye patch while driving.  No recent diplopia or eye pain now.    Bladder/bowel:   Urinary hesitancy was improved by tamsulosin. He noted a big difference when he went off the medication for a few days..   He has constipation with painful BM's only 1 -2 times a week. MiraLAX did not help at first then caused diarrhea.     Fatigue/Attention/Sleep:   He reports physical and mental fatigue.   He had ADD as a child and has had attentional problems.   He felt Ritalin helped his mental fog and attention more than his fatigue.     He sleeps well most nights having insomnia rarely.     He remains active and walks daily.     Mood:   He notes mild depression and he has a history of a severe major depression. He does not have any suicidal ideation at this time though he did in the past..   Occasionally, he might laugh if sad or cry if happy and is also had some tearful episodes  Cognition:   He has had difficulties with focus and attention. Ritalin has helped but he ran out. He felt he was able to get much more complex during the day.       B12:   His B12 was low and he is receiving IM injections and feels it has helpd fatigue and vision.  .   When last checked 02/14/2016, B12 was no longer low.  Knee pain:   He has had right knee pain due to a torn meniscus and now notes similar pain on his left.    MS History:  He was diagnosed with MS in 2001 when he presented with right visual loss.     He had an MRI worrisome for MS.   He was placed on IV steroids and vision got 80% better.    He was placed on an interferon first and then Copaxone because he had tolerability issues.     For two years, he was without insurance and stopped all DMT.   I started seeing him in 2015 and we started Tecfidera.   He tolerates it  well but sometimes forgets the second pill because all his medications are qAM.  MRI brain 2011 shows white matter foci in the periventricular  and juxtacortical white matter consistent with the clinical diagnosis of MS. The right optic nerve was reduced in size. There w was one focus in the right pons and one in the left cerebellar hemisphere, as well. None of the foci were enhancing during that MRI.    REVIEW OF SYSTEMS: Constitutional: No fevers, chills, sweats, or change in appetite.  He notes some fatigue. Occasional insomnia. Eyes: No visual changes, double vision, eye pain Ear, nose and throat: No hearing loss, ear pain, nasal congestion, sore throat Cardiovascular: No chest pain, palpitations Respiratory: No shortness of breath at rest or with exertion.   No wheezes GastrointestinaI: No nausea, vomiting, diarrhea, abdominal pain, fecal incontinence.  Severe constipation Genitourinary: as above.  Musculoskeletal: No neck pain, back pain Integumentary: No rash, pruritus, skin lesions Neurological: as above Psychiatric: Mild depression. Cognitive decline. Endocrine: No palpitations, diaphoresis, change in appetite, change in weigh or increased thirst Hematologic/Lymphatic: No anemia, purpura, petechiae. Allergic/Immunologic: No itchy/runny eyes, nasal congestion, recent allergic reactions, rashes  ALLERGIES: No Known Allergies  HOME MEDICATIONS:  Current Outpatient Medications:  .  amphetamine-dextroamphetamine (ADDERALL) 10 MG tablet, Take up to 3 a day in divided dose, Disp: 90 tablet, Rfl: 0 .  tamsulosin (FLOMAX) 0.4 MG CAPS capsule, Take 1 capsule (0.4 mg total) by mouth daily., Disp: 90 capsule, Rfl: 3 .  TECFIDERA 240 MG CPDR, TAKE 1 CAPSULE BY MOUTH TWICE DAILY, Disp: 180 capsule, Rfl: 3  PAST MEDICAL HISTORY: Past Medical History:  Diagnosis Date  . Depressive disorder, not elsewhere classified   . Disorder of eye, unspecified   . Memory loss   . Multiple sclerosis (HCC)   . 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 Other psoriasis   . Psychosexual dysfunction,  unspecified     PAST SURGICAL HISTORY: Past Surgical History:  Procedure Laterality Date  . KNEE ARTHROSCOPY     right   . TONSILLECTOMY      FAMILY HISTORY: Family History  Problem Relation Age of Onset  . Breast cancer Mother   . COPD Father   . Heart disease Unknown     SOCIAL HISTORY:  Social History   Socioeconomic History  . Marital status: Single    Spouse name: Not on file  . Number of children: 0  . Years of education: Not on file  . Highest education level: Bachelor's degree (e.g., BA, AB, BS)  Occupational History  . Occupation: Energy manager: [employer]    Comment: Fulltime  Social Needs  . Financial resource strain: Not very hard  . Food insecurity:    Worry: Often true    Inability: Often true  . Transportation needs:    Medical: No    Non-medical: No  Tobacco Use  . Smoking status: Heavy Tobacco Smoker    Packs/day: 1.00    Years: 44.00    Pack years: 44.00    Types: Cigarettes  . Smokeless tobacco: Never Used  Substance and Sexual Activity  . Alcohol use: Yes    Alcohol/week: 0.0 standard drinks    Comment: Occasionally/fim  . Drug use: Yes    Types: Marijuana  . Sexual activity: Not on file  Lifestyle  . Physical activity:    Days per week: 0 days    Minutes per session: 0 min  . Stress:  To some extent  Relationships  . Social connections:    Talks on phone: Never    Gets together: Never    Attends religious service: Never    Active member of club or organization: No    Attends meetings of clubs or organizations: Never    Relationship status: Never married  . Intimate partner violence:    Fear of current or ex partner: No    Emotionally abused: No    Physically abused: No    Forced sexual activity: No  Other Topics Concern  . Not on file  Social History Narrative  . Not on file     PHYSICAL EXAM  Vitals:   08/27/18 1329  BP: (!) 147/92  Pulse: (!) 110  Weight: 197 lb 8 oz (89.6 kg)  Height: 6'  (1.829 m)    Body mass index is 26.79 kg/m.   General: The patient is well-developed and well-nourished and in no acute distress  Neurologic Exam  Mental status: The patient is alert and oriented x 3 at the time of the examination.   Ok memory but some distractibility.   Speech is normal.  Cranial nerves: Extraocular movements are full. He has a 1+ APD on the right.   Colors desaturated on the right.   Facial strength and sensation is normal. Trapezius strength is normal. No obvious hearing deficits are noted.  Motor:  Muscle bulk is normal.   Tone is normal. Strength is  5 / 5 in all 4 extremities.   Sensory: Sensation is normal in his arms but reduced in the left foot to touch  Coordination: Cerebellar testing shows good bilateral finger-nose-finger slightly but reduced bilateral heel-to-shin  Gait and station: Station is normal.   The gait is mildly wide and stiff on the right (wearing a brace). Tandem gait is very wide. Romberg is negative.   Reflexes: Deep tendon reflexes are symmetric and mildly increased in the arms and left knee (did not check right due to pain).   There is no ankle clonus. Marland Kitchen       DIAGNOSTIC DATA (LABS, IMAGING, TESTING) - I reviewed patient records, labs, notes, testing and imaging myself where available.  Lab Results  Component Value Date   WBC 7.1 12/17/2017   HGB 15.7 12/17/2017   HCT 45.6 12/17/2017   MCV 92 12/17/2017   PLT 252 12/17/2017      Component Value Date/Time   NA 142 09/28/2017 1000   K 4.4 09/28/2017 1000   CL 104 09/28/2017 1000   CO2 23 09/28/2017 1000   GLUCOSE 93 09/28/2017 1000   GLUCOSE 115 (H) 09/02/2012 1539   BUN 13 09/28/2017 1000   CREATININE 0.90 09/28/2017 1000   CALCIUM 9.4 09/28/2017 1000   PROT 6.8 09/28/2017 1000   ALBUMIN 4.6 09/28/2017 1000   AST 13 09/28/2017 1000   ALT 10 09/28/2017 1000   ALKPHOS 71 09/28/2017 1000   BILITOT 0.6 09/28/2017 1000   GFRNONAA 89 09/28/2017 1000   GFRAA 103 09/28/2017  1000       ASSESSMENT AND PLAN  MULTIPLE SCLEROSIS  Cognitive dysfunction  High risk medication use  Attention deficit  Gait disturbance  Urinary hesitancy  Numbness   1.   Continue Tecfidera.    Check CBC with diff to determine if lymphopenia 2.   Continue tamsulosin for urinary hesitancy.   He would prefer to hold off on seeing urology    3.  He is advised to stay active and exercises  as tolerated. 4.   Renew Adderall, change to 10 mg up to 3 times a day  5.   He will return to see me in 4-5 months or sooner if he has new or worsening neurologic symptoms.   A. Epimenio Foot, MD, PhD 08/27/2018, 2:04 PM Certified in Neurology, Clinical Neurophysiology, Sleep Medicine, Pain Medicine and Neuroimaging  [facility] [address], [address] [place], [place] [address] [phone]      MS History

## 2018-08-27 NOTE — Telephone Encounter (Signed)
I called patient back. Advised we will most likely need to do a PA for medication d/t the way Dr. Epimenio Foot prescribed medication. We will work on this for him. He stated if it does not get approved, he is okay to go back to adderall 20mg  tab. Advised I will let him know what the outcome is. He verbalized understanding.

## 2018-08-27 NOTE — Telephone Encounter (Signed)
Pt requesting a call stating that his insurance amphetamine-dextroamphetamine (ADDERALL) 10 MG tablet will not cover medication. Requesting a call to advise

## 2018-08-28 LAB — CBC WITH DIFFERENTIAL/PLATELET
Basophils Absolute: 0.1 10*3/uL (ref 0.0–0.2)
Basos: 1 %
EOS (ABSOLUTE): 0.1 10*3/uL (ref 0.0–0.4)
Eos: 1 %
Hematocrit: 43.1 % (ref 37.5–51.0)
Hemoglobin: 15.4 g/dL (ref 13.0–17.7)
Immature Grans (Abs): 0.1 10*3/uL (ref 0.0–0.1)
Immature Granulocytes: 1 %
Lymphocytes Absolute: 0.8 10*3/uL (ref 0.7–3.1)
Lymphs: 8 %
MCH: 31.6 pg (ref 26.6–33.0)
MCHC: 35.7 g/dL (ref 31.5–35.7)
MCV: 88 fL (ref 79–97)
Monocytes Absolute: 0.6 10*3/uL (ref 0.1–0.9)
Monocytes: 7 %
Neutrophils Absolute: 8.1 10*3/uL — ABNORMAL HIGH (ref 1.4–7.0)
Neutrophils: 82 %
Platelets: 258 10*3/uL (ref 150–450)
RBC: 4.88 x10E6/uL (ref 4.14–5.80)
RDW: 12.7 % (ref 12.3–15.4)
WBC: 9.7 10*3/uL (ref 3.4–10.8)

## 2018-08-28 MED ORDER — AMPHETAMINE-DEXTROAMPHETAMINE 20 MG PO TABS: 20.0000 mg | ORAL_TABLET | Freq: Every day | ORAL | 0 refills | Status: DC

## 2018-08-28 NOTE — Telephone Encounter (Signed)
Initiated PA adderall on CMM.  Key: [id number] - PA Case ID: [id number]. In process of completing.

## 2018-08-28 NOTE — Telephone Encounter (Signed)
Submitted PA. Waiting on determination. °

## 2018-08-28 NOTE — Telephone Encounter (Signed)
I called pt. Advised PA denied. Insurance will only cover 2 per day. Advised we will change prescriptions back to adderall 20mg  tab twice daily. Called and cx rx adderall 10mg  [facility] at [facility]. They will hold 60tabs adderall 20mg  for pt. They are low in inventory.  He would [id number] [place] [place] - [address]  Sent request to Dr. Epimenio Foot.

## 2018-08-28 NOTE — Addendum Note (Signed)
Addended by: Hillis Range on: 08/28/2018 04:27 PM   Modules accepted: Orders

## 2018-08-28 NOTE — Telephone Encounter (Signed)
[facility] called [facility]. Have not received PA request. Spoke with Adela Lank. She states pt was going to request change in prescription. Insurance will only cover up to 2 tabs per day.   He has Medicare part D plan. ID# [id number], RxBIN: [id number], Rx PCN: [id number], RxGroup: [id number]. Phone#: [phone].

## 2018-08-28 NOTE — Addendum Note (Signed)
Addended by: Asa Lente on: 08/28/2018 04:53 PM   Modules accepted: Orders

## 2018-08-29 ENCOUNTER — Telehealth: Payer: Self-pay | Admitting: *Deleted

## 2018-08-29 NOTE — Telephone Encounter (Signed)
-----   Message from Asa Lente, MD sent at 08/28/2018  4:59 PM EST ----- Please let the patient know that the lab work is fine.

## 2018-08-29 NOTE — Telephone Encounter (Signed)
Called and spoke with pt about lab results. Pt verbalized understanding.  

## 2018-09-04 ENCOUNTER — Telehealth: Payer: Self-pay | Admitting: Neurology

## 2018-09-04 NOTE — Telephone Encounter (Signed)
Pt is asking for a call to discuss his desire to no longer take MS medication, pt states he is unable to afford MS treatment.  Please call

## 2018-09-04 NOTE — Telephone Encounter (Signed)
Called pt back. He received a text stating they would no longer pay for Tecfidera. He could not tell me who the text came from. He no longer had this or the email.  I advised him to call [employer] at [phone] to discuss financial assistance options. Dr. Epimenio Foot recommends he continue treatment. Pt verbalized understanding. He wrote phone number down and repeated back correctly.

## 2018-10-10 ENCOUNTER — Telehealth: Payer: Self-pay | Admitting: Acute Care

## 2018-10-10 NOTE — Telephone Encounter (Signed)
Called and spoke with [facility].  She stated that CT department had reached out to there office, stating that they tried to schedule the Patients CT chest, lung caner screening,and he does not want to do it at this time.  Ct department told Malachi Bonds that if the Patient decides he wants to do it, he will just have to have another referral/order to be placed.  Will route message to Evette Doffing, NP and Angelique Blonder, RN as Lorain Childes

## 2018-10-10 NOTE — Telephone Encounter (Signed)
Denise. Could you please see if the patient wants to continue to scan? Thanks so much.

## 2018-10-11 NOTE — Telephone Encounter (Signed)
Per referral note on 10/07/18:   See below message from [place] with [facility].Gabriel Hanson Pt has MS and said he is not able to afford CT LCS and does not want to resch right now. I asked him if his ins paid for it last year he said he didn't know but did not want to chance having a bill and if it showed anything he wouldn't do anything about it anyway bc of MS.

## 2018-10-14 NOTE — Telephone Encounter (Signed)
Please make sure we send a letter to the patient's PCPso he is aware the patient does not want to continue to be screened for lung cancer. Thanks so much.

## 2018-10-15 ENCOUNTER — Ambulatory Visit: Payer: Medicare Other

## 2018-10-15 NOTE — Telephone Encounter (Signed)
Letter faxed to PCP on 10/08/18 to make them aware that pt does not want to continue in lung cancer screening.

## 2018-10-16 ENCOUNTER — Other Ambulatory Visit: Payer: Self-pay | Admitting: *Deleted

## 2018-10-16 ENCOUNTER — Other Ambulatory Visit: Payer: Self-pay | Admitting: Neurology

## 2018-10-16 MED ORDER — DIMETHYL FUMARATE 240 MG PO CPDR: 240.0000 mg | DELAYED_RELEASE_CAPSULE | Freq: Two times a day (BID) | ORAL | 11 refills | Status: DC

## 2018-10-16 NOTE — Progress Notes (Signed)
e

## 2018-10-16 NOTE — Telephone Encounter (Addendum)
I called patient back. He does not know what specialty pharmacy the refill should go to. He was previously receiving from pt assistance and got [facility]. He states they had been trying to reach out to him to renew but he did not answer the phone d/t not knowing the phone number. He only has a week left of medication. Advised we will contact [employer] to see about getting emergency supply sent to him until we can work out pt assistance. Expressed importance of him answering his phone. Will be from [employer] to verify shipment.  He is having a lot of cognitive problems associated with MS.  I recommended I contact his sister, [place] Hans[place] ([place] He states she moved to [facility] but agreeable to this. Advised I will give her the information to the [employer] about getting him set up for pt assistance for Tecfidera.   Faith, RN spoke with [employer] who will send a 2 week emergency supply to patient to ship out ASAP. She also sent rx to [facility] for pt assistance to start processing this for him.

## 2018-10-16 NOTE — Telephone Encounter (Signed)
Pt is calling for a refill on TECFIDERA 240 MG CPDR and is also needing to discuss his funding for the medications. Please advise.

## 2018-10-17 NOTE — Telephone Encounter (Signed)
I spoke with Gabriel Hanson this am and gave him [employer]'s phone# [phone]). I asked him to call and apply for pt. assistance for his Tecfidera. He sts. that he will/fim

## 2018-10-17 NOTE — Telephone Encounter (Signed)
I attempted to contact pt's sister--phone# no longer in service/fim

## 2018-10-22 ENCOUNTER — Telehealth: Payer: Self-pay | Admitting: *Deleted

## 2018-10-22 NOTE — Telephone Encounter (Signed)
Late entry--On 10/17/18, rx. for Tecfidera 240mg  #28 with prn r/f's,  faxed to [facility], as an interim supply of medication while he is applying for pt. assistance/fim

## 2018-11-21 ENCOUNTER — Ambulatory Visit: Payer: Medicare Other | Admitting: Neurology

## 2018-12-24 ENCOUNTER — Other Ambulatory Visit: Payer: Self-pay | Admitting: Neurology

## 2018-12-24 DIAGNOSIS — R350 Frequency of micturition: Secondary | ICD-10-CM

## 2019-01-29 ENCOUNTER — Telehealth: Payer: Self-pay | Admitting: *Deleted

## 2019-01-29 NOTE — Telephone Encounter (Signed)
Called, LVM for pt to call office so we can update pharmacy/allergies/med list prior to virtual visit with Dr. Epimenio Foot next week.

## 2019-02-03 NOTE — Telephone Encounter (Signed)
I called pt again. Was able to speak with him. Update med list/pharamcy/allergies on file. He confirmed he received email for [facility]. He will use computer and confirmed he downloaded app. Asked him to try and login about 1230pm tomorrow and if he has any issues to call the office. He verbalized understanding.

## 2019-02-03 NOTE — Addendum Note (Signed)
Addended by: Eilene Ghazi L on: 02/03/2019 11:01 AM   Modules accepted: Orders

## 2019-02-04 ENCOUNTER — Ambulatory Visit (INDEPENDENT_AMBULATORY_CARE_PROVIDER_SITE_OTHER): Payer: Medicare Other | Admitting: Neurology

## 2019-02-04 ENCOUNTER — Telehealth: Payer: Self-pay | Admitting: Neurology

## 2019-02-04 ENCOUNTER — Encounter: Payer: Self-pay | Admitting: Neurology

## 2019-02-04 ENCOUNTER — Other Ambulatory Visit: Payer: Self-pay

## 2019-02-04 DIAGNOSIS — R41840 Attention and concentration deficit: Secondary | ICD-10-CM | POA: Diagnosis not present

## 2019-02-04 DIAGNOSIS — R269 Unspecified abnormalities of gait and mobility: Secondary | ICD-10-CM | POA: Diagnosis not present

## 2019-02-04 DIAGNOSIS — R350 Frequency of micturition: Secondary | ICD-10-CM

## 2019-02-04 DIAGNOSIS — G35 Multiple sclerosis: Secondary | ICD-10-CM

## 2019-02-04 DIAGNOSIS — F09 Unspecified mental disorder due to known physiological condition: Secondary | ICD-10-CM | POA: Diagnosis not present

## 2019-02-04 DIAGNOSIS — R200 Anesthesia of skin: Secondary | ICD-10-CM | POA: Diagnosis not present

## 2019-02-04 MED ORDER — TAMSULOSIN HCL 0.4 MG PO CAPS: 0.4000 mg | ORAL_CAPSULE | Freq: Every day | ORAL | 3 refills | Status: DC

## 2019-02-04 MED ORDER — AMPHETAMINE-DEXTROAMPHETAMINE 20 MG PO TABS: ORAL_TABLET | ORAL | 0 refills | Status: DC

## 2019-02-04 NOTE — Telephone Encounter (Signed)
02/04/19 - LVM to schedule 6 month follow-up with Dr. Sater  °

## 2019-02-04 NOTE — Telephone Encounter (Signed)
Medicare order sent to GI. No auth they will reach out to the pt to schedule.  °

## 2019-02-04 NOTE — Progress Notes (Signed)
[facility]  PATIENT: Gabriel Hanson DOB: 08/11/1951  REFERRING DOCTOR OR PCP:  None  SOURCE: patient  _________________________________   HISTORICAL  CHIEF COMPLAINT:  Chief Complaint  Patient presents with  . Multiple Sclerosis    HISTORY OF PRESENT ILLNESS:  Gabriel Hanson is a 68 y.o. man with MS.       Update 02/04/2019: Virtual Visit via Video Note I connected with Gabriel Hanson on 02/04/19 at  1:00 PM EDT by a video enabled telemedicine application and verified that I am speaking with the correct person.  I discussed the limitations of evaluation and management by telemedicine and the availability of in person appointments. The patient expressed understanding and agreed to proceed.  History of Present Illness: He just restarted Tecfidera after being off Jan-March due to an insurance miscommunication.  He tolerates Tecfidera well.   He has had no exacerbations.   He stumbles due to poor balance.  He has mild leg weakness.   Poor balance affects gait more than strength.  He also has a bad right knee (orthopedic).  He has tingling in the feet and ankles.   Tamsulosin helps the urinary retention.   He has constipation  He has some fatigue which was interfering with doing tasks.   He also noted a mental fog with reduced focus and attention.  Both of these symptoms got much better.   Due to his impairments, he no longer drives.     Observations/Objective: He is a well-developed well-nourished woman in no acute distress.  The head is normocephalic and atraumatic.  Sclera are anicteric.  Visible skin appears normal.  The neck has a good range of motion.    He is alert and fully oriented with fluent speech and good attention, knowledge and memory.  Extraocular muscles are intact.  Facial strength is normal.  Rapid alternating movements and finger-nose-finger are performed well.  Assessment and Plan: Multiple sclerosis (HCC) - Plan: MR BRAIN WO CONTRAST   Urinary frequency - Plan: tamsulosin (FLOMAX) 0.4 MG CAPS capsule  Gait disturbance  Attention deficit  Numbness  Cognitive dysfunction  1.   Continue Tecfidera.  We will check an MRI of the brain without contrast to determine if he is having a lot of subclinical progression.  If this is occurring consider a more efficacious disease modifying therapy. 2.   Continue Adderall 20 mg up to twice a day.  He does not take every day and often takes just 1 pill. 3.   renew tamsulosin. 4.   Stay active and exercise as tolerated.  We briefly discussed COVID-19 and MS and disease modifying therapies.  He could be at a mildly increased risk being on Tecfidera though it is likely safer than some other DMT's. 5.   Return to see me in 6 months or sooner if there are new or worsening neurologic symptoms.  Follow Up Instructions: I discussed the assessment and treatment plan with the patient. The patient was provided an opportunity to ask questions and all were answered. The patient agreed with the plan and demonstrated an understanding of the instructions.    The patient was advised to call back or seek an in-person evaluation if the symptoms worsen or if the condition fails to improve as anticipated.  I provided 20 minutes of non-face-to-face time during this encounter. ____________________________ From prior visits Update 08/27/2018: He feels his MS is stable.    H is on Tecfidera and he tolerates it well.   He  denies any exacerbation.     He feels gait is stable.    He has more tingling in his legs/feet.   He notes mildly more weakness and feels a little more off balanced,    No falls.  Also has a torn meniscus in right knee and wears a brace.  Stairs are difficult.     He has urinary frequency as well as hesitancy/ difficulty emptying and also has constipation.   Tamsulosin has helped him empty better but he has some leakage.   We discussed seeing urology.     Miralax helps constipation some.     He  stopped driving as he felt he was no longer safe.    Focus/attention has ben an issue and he also has visual problems.   The Adderall has greatly helped the focus.   It has helped processing speed and multitasking as well.      He is on Tecfidera and he tolerates it well.    Update 12/17/2017:   He ran out of Tecfidera about 2 weeks ago.   He denies any exacerbations or other new symptoms.  He was tolerating Tecfidera well.  His gait is a little wide due to leg weakness and stiffness.  Stiffness is worse upon standing up after sitting a while.    He has some tingling in his feet.     He has some urinary hesitancy.    Flomax has helped.     His vision is usually good but he notes blurriness when he is tired.    Adderall helps his cognition, especially focus and attention.  He is completing tasks more easily.   His fatigue may help the memory and cognition.   He seldom takes the pm dose as sleep was affected,   Sleep is good most nights without the second dose.   Marland Kitchen    Update 08/16/2017:   He feels his MS has been mostly stable. He is on Tecfidera. He tolerates it well.    He denies new issues with gait, strength or sensation.   His left leg is more affected from MS and right leg from ortho issues.  Both legs seem stiff and he takes a few extra seconds to stand up.     There is stable tingling in his toes that is not painful.      Bladder issues are worse but he ran out of Flomax.     He notes some visual blurring especially when he is tired. But this is stable.  He has fatigue and the Ritalin has not helped.   In the past it helped cognition but more recently it did not help at all.    Cognition continues to be a major problem.   He has trouble with focus/attnetion, STM, verbal fluency.    He is sleeping fairly well at night.  Aleve has helped the leg pain.     ________________________________________________ From 04/10/2017: MS:    He was placed on Tecfidera are in 2015. Initially, he had good  compliance but then in 2017 compliance was very poor and we considered a switch to Tysabri. However, the JCV Ab was middle positive at 0.99 and he did not start.   He would like to get back on Tecfidera.   Gait/strength/sensation:   He continues to note some difficulty with his gait. It is clumsy and he feels the left leg gives out at times. He was walking 5 miles a day but more recently is having  difficulty walking 1 mile.  He also has some tingling in the feet bilaterally.   The arms have normal strength and sensation.    Vision:  He is noting more difficulty with his vision. He had optic neuritis on the right in the past and that eye has never been as strong as the left. However, more recently, the left eye also is having more difficulty.    Colors seem ok.    He had an episode of diplopia x 2 weeks many years ago and used an eye patch while driving.  No recent diplopia or eye pain now.    Bladder/bowel:   Urinary hesitancy was improved by tamsulosin. He noted a big difference when he went off the medication for a few days..   He has constipation with painful BM's only 1 -2 times a week. MiraLAX did not help at first then caused diarrhea.     Fatigue/Attention/Sleep:   He reports physical and mental fatigue.   He had ADD as a child and has had attentional problems.   He felt Ritalin helped his mental fog and attention more than his fatigue.     He sleeps well most nights having insomnia rarely.     He remains active and walks daily.     Mood:   He notes mild depression and he has a history of a severe major depression. He does not have any suicidal ideation at this time though he did in the past..   Occasionally, he might laugh if sad or cry if happy and is also had some tearful episodes  Cognition:   He has had difficulties with focus and attention. Ritalin has helped but he ran out. He felt he was able to get much more complex during the day.       B12:   His B12 was low and he is receiving IM  injections and feels it has helpd fatigue and vision.  .   When last checked 02/14/2016, B12 was no longer low.  Knee pain:   He has had right knee pain due to a torn meniscus and now notes similar pain on his left.    MS History:  He was diagnosed with MS in 2001 when he presented with right visual loss.     He had an MRI worrisome for MS.   He was placed on IV steroids and vision got 80% better.    He was placed on an interferon first and then Copaxone because he had tolerability issues.     For two years, he was without insurance and stopped all DMT.   I started seeing him in 2015 and we started Tecfidera.   He tolerates it well but sometimes forgets the second pill because all his medications are qAM.  MRI brain 2011 shows white matter foci in the periventricular and juxtacortical white matter consistent with the clinical diagnosis of MS. The right optic nerve was reduced in size. There w was one focus in the right pons and one in the left cerebellar hemisphere, as well. None of the foci were enhancing during that MRI.    REVIEW OF SYSTEMS: Constitutional: No fevers, chills, sweats, or change in appetite.  He notes some fatigue. Occasional insomnia. Eyes: No visual changes, double vision, eye pain Ear, nose and throat: No hearing loss, ear pain, nasal congestion, sore throat Cardiovascular: No chest pain, palpitations Respiratory: No shortness of breath at rest or with exertion.   No wheezes GastrointestinaI: No nausea, vomiting,  diarrhea, abdominal pain, fecal incontinence.  Severe constipation Genitourinary: as above.  Musculoskeletal: No neck pain, back pain Integumentary: No rash, pruritus, skin lesions Neurological: as above Psychiatric: Mild depression. Cognitive decline. Endocrine: No palpitations, diaphoresis, change in appetite, change in weigh or increased thirst Hematologic/Lymphatic: No anemia, purpura, petechiae. Allergic/Immunologic: No itchy/runny eyes, nasal congestion,  recent allergic reactions, rashes  ALLERGIES: No Known Allergies  HOME MEDICATIONS:  Current Outpatient Medications:  .  amphetamine-dextroamphetamine (ADDERALL) 20 MG tablet, Take one or two pills a day as needed., Disp: 60 tablet, Rfl: 0 .  Dimethyl Fumarate (TECFIDERA) 240 MG CPDR, Take 1 capsule (240 mg total) by mouth 2 (two) times daily., Disp: 28 capsule, Rfl: 11 .  naproxen sodium (ALEVE) 220 MG tablet, Take 660 mg by mouth daily., Disp: , Rfl:  .  tamsulosin (FLOMAX) 0.4 MG CAPS capsule, Take 1 capsule (0.4 mg total) by mouth daily., Disp: 90 capsule, Rfl: 3  PAST MEDICAL HISTORY: Past Medical History:  Diagnosis Date  . Depressive disorder, not elsewhere classified   . Disorder of eye, unspecified   . Memory loss   . Multiple sclerosis (HCC)   . 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 Other psoriasis   . Psychosexual dysfunction, unspecified     PAST SURGICAL HISTORY: Past Surgical History:  Procedure Laterality Date  . KNEE ARTHROSCOPY     right   . TONSILLECTOMY      FAMILY HISTORY: Family History  Problem Relation Age of Onset  . Breast cancer Mother   . COPD Father   . Heart disease Unknown     SOCIAL HISTORY:  Social History   Socioeconomic History  . Marital status: Single    Spouse name: Not on file  . Number of children: 0  . Years of education: Not on file  . Highest education level: Bachelor's degree (e.g., BA, AB, BS)  Occupational History  . Occupation: Energy manager: [employer]    Comment: Fulltime  Social Needs  . Financial resource strain: Not very hard  . Food insecurity:    Worry: Often true    Inability: Often true  . Transportation needs:    Medical: No    Non-medical: No  Tobacco Use  . Smoking status: Heavy Tobacco Smoker    Packs/day: 1.00    Years: 44.00    Pack years: 44.00    Types: Cigarettes  . Smokeless tobacco: Never Used  Substance and Sexual Activity  .  Alcohol use: Yes    Alcohol/week: 0.0 standard drinks    Comment: Occasionally/fim  . Drug use: Yes    Types: Marijuana  . Sexual activity: Not on file  Lifestyle  . Physical activity:    Days per week: 0 days    Minutes per session: 0 min  . Stress: To some extent  Relationships  . Social connections:    Talks on phone: Never    Gets together: Never    Attends religious service: Never    Active member of club or organization: No    Attends meetings of clubs or organizations: Never    Relationship status: Never married  . Intimate partner violence:    Fear of current or ex partner: No    Emotionally abused: No    Physically abused: No    Forced sexual activity: No  Other Topics Concern  . Not on file  Social History Narrative  . Not on file  PHYSICAL EXAM  There were no vitals filed for this visit.  There is no height or weight on file to calculate BMI.   General: The patient is well-developed and well-nourished and in no acute distress  Neurologic Exam  Mental status: The patient is alert and oriented x 3 at the time of the examination.   Ok memory but some distractibility.   Speech is normal.  Cranial nerves: Extraocular movements are full. He has a 1+ APD on the right.   Colors desaturated on the right.   Facial strength and sensation is normal. Trapezius strength is normal. No obvious hearing deficits are noted.  Motor:  Muscle bulk is normal.   Tone is normal. Strength is  5 / 5 in all 4 extremities.   Sensory: Sensation is normal in his arms but reduced in the left foot to touch  Coordination: Cerebellar testing shows good bilateral finger-nose-finger slightly but reduced bilateral heel-to-shin  Gait and station: Station is normal.   The gait is mildly wide and stiff on the right (wearing a brace). Tandem gait is very wide. Romberg is negative.   Reflexes: Deep tendon reflexes are symmetric and mildly increased in the arms and left knee (did not check  right due to pain).   There is no ankle clonus. Marland Kitchen       DIAGNOSTIC DATA (LABS, IMAGING, TESTING) - I reviewed patient records, labs, notes, testing and imaging myself where available.  Lab Results  Component Value Date   WBC 9.7 08/27/2018   HGB 15.4 08/27/2018   HCT 43.1 08/27/2018   MCV 88 08/27/2018   PLT 258 08/27/2018      Component Value Date/Time   NA 142 09/28/2017 1000   K 4.4 09/28/2017 1000   CL 104 09/28/2017 1000   CO2 23 09/28/2017 1000   GLUCOSE 93 09/28/2017 1000   GLUCOSE 115 (H) 09/02/2012 1539   BUN 13 09/28/2017 1000   CREATININE 0.90 09/28/2017 1000   CALCIUM 9.4 09/28/2017 1000   PROT 6.8 09/28/2017 1000   ALBUMIN 4.6 09/28/2017 1000   AST 13 09/28/2017 1000   ALT 10 09/28/2017 1000   ALKPHOS 71 09/28/2017 1000   BILITOT 0.6 09/28/2017 1000   GFRNONAA 89 09/28/2017 1000   GFRAA 103 09/28/2017 1000       ASSESSMENT AND PLAN  Multiple sclerosis (HCC) - Plan: MR BRAIN WO CONTRAST  Urinary frequency - Plan: tamsulosin (FLOMAX) 0.4 MG CAPS capsule  Gait disturbance  Attention deficit  Numbness  Cognitive dysfunction    Richard A. Epimenio Foot, MD, PhD 02/04/2019, 1:23 PM Certified in Neurology, Clinical Neurophysiology, Sleep Medicine, Pain Medicine and Neuroimaging  [facility] [address] [place], [place] [address] [phone]

## 2019-02-04 NOTE — Telephone Encounter (Signed)
-----   Message from Asa Lente, MD sent at 02/04/2019  1:26 PM EDT ----- F/u in 6 months

## 2019-02-24 ENCOUNTER — Other Ambulatory Visit: Payer: Medicare Other

## 2019-03-06 ENCOUNTER — Ambulatory Visit: Payer: Medicare Other

## 2019-04-01 ENCOUNTER — Other Ambulatory Visit: Payer: Self-pay

## 2019-04-01 ENCOUNTER — Ambulatory Visit
Admission: RE | Admit: 2019-04-01 | Discharge: 2019-04-01 | Disposition: A | Discharge status: Home / Self Care | Payer: Medicare Other | Source: Ambulatory Visit | Attending: Neurology | Admitting: Neurology

## 2019-04-01 DIAGNOSIS — G35 Multiple sclerosis: Secondary | ICD-10-CM | POA: Diagnosis not present

## 2019-04-01 MED ORDER — GADOBENATE DIMEGLUMINE 529 MG/ML IV SOLN
20.0000 mL | Freq: Once | INTRAVENOUS | Status: AC | PRN
  Administered 2019-04-01: 20 mL via INTRAVENOUS

## 2019-04-08 ENCOUNTER — Telehealth: Payer: Self-pay | Admitting: *Deleted

## 2019-04-08 NOTE — Telephone Encounter (Signed)
-----   Message from Britt Bottom, MD sent at 04/07/2019  5:09 PM EDT ----- Please let the patient know that the MRI does not show any new MS lesions

## 2019-04-08 NOTE — Telephone Encounter (Signed)
Called, LVM for pt to call office.  Okay to relay MRI did not know any new MS lesions per Dr. Felecia Shelling if he calls

## 2019-04-10 ENCOUNTER — Encounter: Payer: Self-pay | Admitting: *Deleted

## 2019-04-10 NOTE — Telephone Encounter (Signed)
Called, LVM for pt to call office again.   Tried sister's number but it was not in service.  I will send pt letter about results.

## 2019-04-22 ENCOUNTER — Other Ambulatory Visit: Payer: Self-pay | Admitting: Neurology

## 2019-04-22 MED ORDER — AMPHETAMINE-DEXTROAMPHETAMINE 20 MG PO TABS: ORAL_TABLET | ORAL | 0 refills | Status: DC

## 2019-04-22 NOTE — Telephone Encounter (Signed)
Checked drug registry. He last refilled 02/04/19 #[id number]. Last seen 02/04/19. Has no 6 month f/u scheduled.  I called pt. Scheduled f/u 08/06/19 at 1130 am with Dr. Felecia Shelling. Advised I will send refill request to Dr. Felecia Shelling. He verbalized understanding.

## 2019-04-22 NOTE — Telephone Encounter (Signed)
Patient called and requested a refill on rx adderall.

## 2019-06-04 ENCOUNTER — Other Ambulatory Visit: Payer: Self-pay | Admitting: Neurology

## 2019-06-04 MED ORDER — AMPHETAMINE-DEXTROAMPHETAMINE 20 MG PO TABS: ORAL_TABLET | ORAL | 0 refills | Status: DC

## 2019-06-04 NOTE — Telephone Encounter (Signed)
Pt has called for a refill on his amphetamine-dextroamphetamine (ADDERALL) 20 MG tablet [id number]

## 2019-06-04 NOTE — Addendum Note (Signed)
Addended by: Hope Pigeon on: 06/04/2019 01:49 PM   Modules accepted: Orders

## 2019-06-06 DIAGNOSIS — Z23 Encounter for immunization: Secondary | ICD-10-CM | POA: Diagnosis not present

## 2019-06-25 ENCOUNTER — Other Ambulatory Visit: Payer: Self-pay | Admitting: *Deleted

## 2019-06-25 DIAGNOSIS — G35 Multiple sclerosis: Secondary | ICD-10-CM

## 2019-06-25 MED ORDER — TECFIDERA 240 MG PO CPDR: DELAYED_RELEASE_CAPSULE | ORAL | 11 refills | Status: DC

## 2019-08-06 ENCOUNTER — Ambulatory Visit: Payer: Self-pay | Admitting: Neurology

## 2019-08-07 ENCOUNTER — Encounter: Payer: Self-pay | Admitting: Neurology

## 2019-11-13 ENCOUNTER — Telehealth: Payer: Self-pay | Admitting: Neurology

## 2019-11-13 NOTE — Telephone Encounter (Signed)
Pt is needing to speak to RN or Provider about getting his TECFIDERA 240 MG CPDR Pt states he missed an appt and has not taken his medications for about 2 months and now he is realizing how much he really is needing them and is wanting them called in for him. He also would like to know if he can be seen before March. MD and NP schedules were checked and there was no availability before March. Please advise.

## 2019-11-13 NOTE — Telephone Encounter (Signed)
Called pt back. Scheduled f/u for 11/18/19 at 2pm with Dr. Epimenio Foot. Advised him to make sure to wear a mask to appt. If he develops sx of covid-19 or is exposed to anyone with covid-19 he should let us know before coming to appt. He verbalized understanding.

## 2019-11-18 ENCOUNTER — Other Ambulatory Visit: Payer: Self-pay

## 2019-11-18 ENCOUNTER — Ambulatory Visit (INDEPENDENT_AMBULATORY_CARE_PROVIDER_SITE_OTHER): Payer: Medicare Other | Admitting: Neurology

## 2019-11-18 ENCOUNTER — Encounter: Payer: Self-pay | Admitting: Neurology

## 2019-11-18 VITALS — BP 142/85 | HR 100 | Temp 97.9°F | Ht 72.0 in | Wt 193.0 lb

## 2019-11-18 DIAGNOSIS — R269 Unspecified abnormalities of gait and mobility: Secondary | ICD-10-CM | POA: Diagnosis not present

## 2019-11-18 DIAGNOSIS — G35 Multiple sclerosis: Secondary | ICD-10-CM | POA: Diagnosis not present

## 2019-11-18 DIAGNOSIS — F09 Unspecified mental disorder due to known physiological condition: Secondary | ICD-10-CM

## 2019-11-18 DIAGNOSIS — Z79899 Other long term (current) drug therapy: Secondary | ICD-10-CM

## 2019-11-18 DIAGNOSIS — R3911 Hesitancy of micturition: Secondary | ICD-10-CM

## 2019-11-18 DIAGNOSIS — R350 Frequency of micturition: Secondary | ICD-10-CM

## 2019-11-18 DIAGNOSIS — R200 Anesthesia of skin: Secondary | ICD-10-CM

## 2019-11-18 MED ORDER — TAMSULOSIN HCL 0.4 MG PO CAPS: 0.4000 mg | ORAL_CAPSULE | Freq: Every day | ORAL | 3 refills | Status: DC

## 2019-11-18 MED ORDER — TECFIDERA 240 MG PO CPDR: DELAYED_RELEASE_CAPSULE | ORAL | 11 refills | Status: DC

## 2019-11-18 MED ORDER — AMPHETAMINE-DEXTROAMPHETAMINE 20 MG PO TABS: ORAL_TABLET | ORAL | 0 refills | Status: DC

## 2019-11-18 NOTE — Progress Notes (Signed)
[facility]  PATIENT: Gabriel Hanson DOB: July 10, 1951  REFERRING DOCTOR OR PCP:  None  SOURCE: patient  _________________________________   HISTORICAL  CHIEF COMPLAINT:  Chief Complaint  Patient presents with  . Follow-up    RM 12, alone. Last seen 02/04/2019. He ran out of Tecfidera about 2 months ago.     HISTORY OF PRESENT ILLNESS:  Gabriel Hanson is a 69 y.o. man with relapsing remitting MS.       Update 11/18/2019: He has had trouble getting his Tecfidera.   He reports he was supposed to have a shipment but he had communications issues with insurance.   He is not sure how to get back in contact with them  He tolerated Tecfidera well and feels his MS was well controlled.   He has felt much worse the past couple months.   Adderall, even just 1/2 pill, helps his alertness.      He notes his gait is off balanced and this seems worse to him compared to last year.   Leg strength is fine.  He gets a cold paraestheia in the legs.   He has urinary hesitancy with urgency at times.      He has fatigue and cognitive fog --- both helped by Adderall.    He was sleeping well most nights but lately he notes he needs to go to bed earlier than he used to.    He also wakes up several times at night.   Mood is doing well.     Update 02/04/2019 (virtual) He just restarted Tecfidera after being off Jan-March due to an insurance miscommunication.  He tolerates Tecfidera well.   He has had no exacerbations.   He stumbles due to poor balance.  He has mild leg weakness.   Poor balance affects gait more than strength.  He also has a bad right knee (orthopedic).  He has tingling in the feet and ankles.   Tamsulosin helps the urinary retention.   He has constipation  He has some fatigue which was interfering with doing tasks.   He also noted a mental fog with reduced focus and attention.  Both of these symptoms got much better.   Due to his impairments, he no longer drives.     1.    Continue Tecfidera.  We will check an MRI of the brain without contrast to determine if he is having a lot of subclinical progression.  If this is occurring consider a more efficacious disease modifying therapy. 2.   Continue Adderall 20 mg up to twice a day.  He does not take every day and often takes just 1 pill. 3.   renew tamsulosin. 4.   Stay active and exercise as tolerated.  We briefly discussed COVID-19 and MS and disease modifying therapies.  He could be at a mildly increased risk being on Tecfidera though it is likely safer than some other DMT's. 5.   Return to see me in 6 months or sooner if there are new or worsening neurologic symptoms.  Update 08/27/2018: He feels his MS is stable.    H is on Tecfidera and he tolerates it well.   He denies any exacerbation.     He feels gait is stable.    He has more tingling in his legs/feet.   He notes mildly more weakness and feels a little more off balanced,    No falls.  Also has a torn meniscus in right knee and wears a  brace.  Stairs are difficult.     He has urinary frequency as well as hesitancy/ difficulty emptying and also has constipation.   Tamsulosin has helped him empty better but he has some leakage.   We discussed seeing urology.     Miralax helps constipation some.     He stopped driving as he felt he was no longer safe.    Focus/attention has ben an issue and he also has visual problems.   The Adderall has greatly helped the focus.   It has helped processing speed and multitasking as well.      He is on Tecfidera and he tolerates it well.    Update 12/17/2017:   He ran out of Tecfidera about 2 weeks ago.   He denies any exacerbations or other new symptoms.  He was tolerating Tecfidera well.  His gait is a little wide due to leg weakness and stiffness.  Stiffness is worse upon standing up after sitting a while.    He has some tingling in his feet.     He has some urinary hesitancy.    Flomax has helped.     His vision is usually good but  he notes blurriness when he is tired.    Adderall helps his cognition, especially focus and attention.  He is completing tasks more easily.   His fatigue may help the memory and cognition.   He seldom takes the pm dose as sleep was affected,   Sleep is good most nights without the second dose.   Marland Kitchen    Update 08/16/2017:   He feels his MS has been mostly stable. He is on Tecfidera. He tolerates it well.    He denies new issues with gait, strength or sensation.   His left leg is more affected from [place] and right leg from ortho issues.  Both legs seem stiff and he takes a few extra seconds to stand up.     There is stable tingling in his toes that is not painful.      Bladder issues are worse but he ran out of Flomax.     He notes some visual blurring especially when he is tired. But this is stable.  He has fatigue and the Ritalin has not helped.   In the past it helped cognition but more recently it did not help at all.    Cognition continues to be a major problem.   He has trouble with focus/attnetion, STM, verbal fluency.    He is sleeping fairly well at night.  Aleve has helped the leg pain.     ________________________________________________ From 04/10/2017: MS:    He was placed on Tecfidera are in 2015. Initially, he had good compliance but then in 2017 compliance was very poor and we considered a switch to Tysabri. However, the JCV Ab was middle positive at 0.99 and he did not start.   He would like to get back on Tecfidera.   Gait/strength/sensation:   He continues to note some difficulty with his gait. It is clumsy and he feels the left leg gives out at times. He was walking 5 miles a day but more recently is having difficulty walking 1 mile.  He also has some tingling in the feet bilaterally.   The arms have normal strength and sensation.    Vision:  He is noting more difficulty with his vision. He had optic neuritis on the right in the past and that eye has never been as strong  as the left.  However, more recently, the left eye also is having more difficulty.    Colors seem ok.    He had an episode of diplopia x 2 weeks many years ago and used an eye patch while driving.  No recent diplopia or eye pain now.    Bladder/bowel:   Urinary hesitancy was improved by tamsulosin. He noted a big difference when he went off the medication for a few days..   He has constipation with painful BM's only 1 -2 times a week. MiraLAX did not help at first then caused diarrhea.     Fatigue/Attention/Sleep:   He reports physical and mental fatigue.   He had ADD as a child and has had attentional problems.   He felt Ritalin helped his mental fog and attention more than his fatigue.     He sleeps well most nights having insomnia rarely.     He remains active and walks daily.     Mood:   He notes mild depression and he has a history of a severe major depression. He does not have any suicidal ideation at this time though he did in the past..   Occasionally, he might laugh if sad or cry if happy and is also had some tearful episodes  Cognition:   He has had difficulties with focus and attention. Ritalin has helped but he ran out. He felt he was able to get much more complex during the day.       B12:   His B12 was low and he is receiving IM injections and feels it has helpd fatigue and vision.  .   When last checked 02/14/2016, B12 was no longer low.  Knee pain:   He has had right knee pain due to a torn meniscus and now notes similar pain on his left.    MS History:  He was diagnosed with MS in 2001 when he presented with right visual loss.     He had an MRI worrisome for MS.   He was placed on IV steroids and vision got 80% better.    He was placed on an interferon first and then Copaxone because he had tolerability issues.     For two years, he was without insurance and stopped all DMT.   I started seeing him in 2015 and we started Tecfidera.   He tolerates it well but sometimes forgets the second pill because  all his medications are qAM.  MRI brain 2011 shows white matter foci in the periventricular and juxtacortical white matter consistent with the clinical diagnosis of MS. The right optic nerve was reduced in size. There w was one focus in the right pons and one in the left cerebellar hemisphere, as well. None of the foci were enhancing during that MRI.    REVIEW OF SYSTEMS: Constitutional: No fevers, chills, sweats, or change in appetite.  He notes some fatigue. Occasional insomnia. Eyes: No visual changes, double vision, eye pain Ear, nose and throat: No hearing loss, ear pain, nasal congestion, sore throat Cardiovascular: No chest pain, palpitations Respiratory: No shortness of breath at rest or with exertion.   No wheezes GastrointestinaI: No nausea, vomiting, diarrhea, abdominal pain, fecal incontinence.  Severe constipation Genitourinary: as above.  Musculoskeletal: No neck pain, back pain Integumentary: No rash, pruritus, skin lesions Neurological: as above Psychiatric: Mild depression. Cognitive decline. Endocrine: No palpitations, diaphoresis, change in appetite, change in weigh or increased thirst Hematologic/Lymphatic: No anemia, purpura, petechiae. Allergic/Immunologic: No itchy/runny  eyes, nasal congestion, recent allergic reactions, rashes  ALLERGIES: No Known Allergies  HOME MEDICATIONS:  Current Outpatient Medications:  .  naproxen sodium (ALEVE) 220 MG tablet, Take 660 mg by mouth daily., Disp: , Rfl:  .  tamsulosin (FLOMAX) 0.4 MG CAPS capsule, Take 1 capsule (0.4 mg total) by mouth daily., Disp: 90 capsule, Rfl: 3 .  amphetamine-dextroamphetamine (ADDERALL) 20 MG tablet, Take one or two pills a day as needed., Disp: 60 tablet, Rfl: 0 .  TECFIDERA 240 MG CPDR, Take 1 capsule by mouth twice daily, Disp: 60 capsule, Rfl: 11  PAST MEDICAL HISTORY: Past Medical History:  Diagnosis Date  . Depressive disorder, not elsewhere classified   . Disorder of eye, unspecified    . Memory loss   . Multiple sclerosis (HCC)   . 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 Other psoriasis   . Psychosexual dysfunction, unspecified     PAST SURGICAL HISTORY: Past Surgical History:  Procedure Laterality Date  . KNEE ARTHROSCOPY     right   . TONSILLECTOMY      FAMILY HISTORY: Family History  Problem Relation Age of Onset  . Breast cancer Mother   . COPD Father   . Heart disease Unknown     SOCIAL HISTORY:  Social History   Socioeconomic History  . Marital status: Single    Spouse name: Not on file  . Number of children: 0  . Years of education: Not on file  . Highest education level: Bachelor's degree (e.g., BA, AB, BS)  Occupational History  . Occupation: Energy manager: [employer]    Comment: Fulltime  Tobacco Use  . Smoking status: Heavy Tobacco Smoker    Packs/day: 1.00    Years: 44.00    Pack years: 44.00    Types: Cigarettes  . Smokeless tobacco: Never Used  Substance and Sexual Activity  . Alcohol use: Yes    Alcohol/week: 0.0 standard drinks    Comment: Occasionally/fim  . Drug use: Yes    Types: Marijuana  . Sexual activity: Not on file  Other Topics Concern  . Not on file  Social History Narrative  . Not on file   Social Determinants of Health   Financial Resource Strain:   . Difficulty of Paying Living Expenses: Not on file  Food Insecurity:   . Worried About Programme researcher, broadcasting/film/video in the Last Year: Not on file  . Ran Out of Food in the Last Year: Not on file  Transportation Needs:   . Lack of Transportation (Medical): Not on file  . Lack of Transportation (Non-Medical): Not on file  Physical Activity:   . Days of Exercise per Week: Not on file  . Minutes of Exercise per Session: Not on file  Stress:   . Feeling of Stress : Not on file  Social Connections:   . Frequency of Communication with Friends and Family: Not on file  . Frequency of Social Gatherings with Friends  and Family: Not on file  . Attends Religious Services: Not on file  . Active Member of Clubs or Organizations: Not on file  . Attends Banker Meetings: Not on file  . Marital Status: Not on file  Intimate Partner Violence:   . Fear of Current or Ex-Partner: Not on file  . Emotionally Abused: Not on file  . Physically Abused: Not on file  . Sexually Abused: Not on file     PHYSICAL EXAM  Vitals:   11/18/19 1357  BP: (!) 142/85  Pulse: 100  Temp: 97.9 F (36.6 C)  Weight: 193 lb (87.5 kg)  Height: 6' (1.829 m)    Body mass index is 26.18 kg/m.   General: The patient is well-developed and well-nourished and in no acute distress  Neurologic Exam  Mental status: The patient is alert and oriented x 3 at the time of the examination.   Ok memory but some distractibility.   Speech is normal.  Cranial nerves: Extraocular movements are full. He has a 1+ APD on the right.   Vision is blurry and colors desaturated on the right.   Facial strength and sensation is normal. Trapezius strength is normal. No obvious hearing deficits are noted.  Motor:  Muscle bulk is normal.   Tone is slightly increased n legs.  . Strength is  5 / 5 in all 4 extremities   Sensory: Sensation is normal in his arms but reduced in the left foot to touch  Coordination: Cerebellar testing shows good bilateral finger-nose-finger slightly but reduced bilateral heel-to-shin  Gait and station: Station is normal.   The gait is wide with a mild right foot drop  He cannot do a tandem walk. Romberg is negative.   Reflexes: Deep tendon reflexes are symmetric and mildly increased in the Legs.   No ankle is no ankle clonus. Marland Kitchen       DIAGNOSTIC DATA (LABS, IMAGING, TESTING) - I reviewed patient records, labs, notes, testing and imaging myself where available.  Lab Results  Component Value Date   WBC 9.7 08/27/2018   HGB 15.4 08/27/2018   HCT 43.1 08/27/2018   MCV 88 08/27/2018   PLT 258 08/27/2018       Component Value Date/Time   NA 142 09/28/2017 1000   K 4.4 09/28/2017 1000   CL 104 09/28/2017 1000   CO2 23 09/28/2017 1000   GLUCOSE 93 09/28/2017 1000   GLUCOSE 115 (H) 09/02/2012 1539   BUN 13 09/28/2017 1000   CREATININE 0.90 09/28/2017 1000   CALCIUM 9.4 09/28/2017 1000   PROT 6.8 09/28/2017 1000   ALBUMIN 4.6 09/28/2017 1000   AST 13 09/28/2017 1000   ALT 10 09/28/2017 1000   ALKPHOS 71 09/28/2017 1000   BILITOT 0.6 09/28/2017 1000   GFRNONAA 89 09/28/2017 1000   GFRAA 103 09/28/2017 1000       ASSESSMENT AND PLAN  Multiple sclerosis (HCC) - Plan: TECFIDERA 240 MG CPDR  Urinary frequency - Plan: tamsulosin (FLOMAX) 0.4 MG CAPS capsule  Cognitive dysfunction  High risk medication use  Urinary hesitancy  Gait disturbance  Numbness  1.   Get back on Tecfidera.   Check labs.    2.   Adderall 10 mg once or twice a day 3.   Tamsulosin one po daily 4.   rtc 6 months.   Kathie Posa A. Epimenio Foot, MD, PhD 11/18/2019, 2:38 PM Certified in Neurology, Clinical Neurophysiology, Sleep Medicine, Pain Medicine and Neuroimaging  [facility] [address] [place], [place] [address] [phone]

## 2019-11-19 ENCOUNTER — Telehealth: Payer: Self-pay | Admitting: *Deleted

## 2019-11-19 LAB — CBC WITH DIFFERENTIAL/PLATELET
Basophils Absolute: 0.1 10*3/uL (ref 0.0–0.2)
Basos: 1 %
EOS (ABSOLUTE): 0.1 10*3/uL (ref 0.0–0.4)
Eos: 2 %
Hematocrit: 46.6 % (ref 37.5–51.0)
Hemoglobin: 15.7 g/dL (ref 13.0–17.7)
Immature Grans (Abs): 0 10*3/uL (ref 0.0–0.1)
Immature Granulocytes: 0 %
Lymphocytes Absolute: 1 10*3/uL (ref 0.7–3.1)
Lymphs: 16 %
MCH: 31.1 pg (ref 26.6–33.0)
MCHC: 33.7 g/dL (ref 31.5–35.7)
MCV: 92 fL (ref 79–97)
Monocytes Absolute: 0.5 10*3/uL (ref 0.1–0.9)
Monocytes: 8 %
Neutrophils Absolute: 4.8 10*3/uL (ref 1.4–7.0)
Neutrophils: 73 %
Platelets: 264 10*3/uL (ref 150–450)
RBC: 5.05 x10E6/uL (ref 4.14–5.80)
RDW: 13.2 % (ref 11.6–15.4)
WBC: 6.5 10*3/uL (ref 3.4–10.8)

## 2019-11-19 NOTE — Telephone Encounter (Signed)
-----   Message from Asa Lente, MD sent at 11/19/2019 10:30 AM EST ----- Please let the patient know that the lab work is fine.

## 2019-11-19 NOTE — Telephone Encounter (Signed)
Called and spoke with pt about lab results per Dr. Sater note. He verbalized understanding. 

## 2019-11-25 ENCOUNTER — Telehealth: Payer: Self-pay | Admitting: Neurology

## 2019-11-25 NOTE — Telephone Encounter (Signed)
Pt is asking for a call from RN to discuss his TECFIDERA 240 MG CPDR, which he has not had for 2 months, pt is being asked if he is going to go back to the 100mg  or stay at the 200 mg amount, please call

## 2019-11-25 NOTE — Telephone Encounter (Signed)
I called pt back. He has spoken with [facility] several times who has told him they need to speak with our office about rx. Advised I will call [facility] to find out what they need and will call him back after. He verbalized understanding.  I called [facility] and spoke with rep. They have set up delivery for 11/27/19 for Tecfidera.  Pt was off of medication for more than 6 wks and they are wanting to know if he needs to re-titrate. Advised per Dr. Epimenio Foot that he does. I provided VO to Veto (pharmacist) for titration pack: 120mg  po BIDx7 days and then 240mg  po BID x23 days, no refills. He verbalized understanding and will process this for pt. Nothing further needed.  I called pt back and informed him everything is resolved. He verbalized understanding.

## 2019-12-09 ENCOUNTER — Encounter (INDEPENDENT_AMBULATORY_CARE_PROVIDER_SITE_OTHER): Payer: Self-pay

## 2020-01-07 ENCOUNTER — Other Ambulatory Visit: Payer: Self-pay

## 2020-01-08 MED ORDER — AMPHETAMINE-DEXTROAMPHETAMINE 20 MG PO TABS: ORAL_TABLET | ORAL | 0 refills | Status: DC

## 2020-02-06 ENCOUNTER — Ambulatory Visit: Payer: Medicare Other

## 2020-02-16 ENCOUNTER — Telehealth: Payer: Self-pay | Admitting: Neurology

## 2020-02-16 ENCOUNTER — Other Ambulatory Visit: Payer: Self-pay | Admitting: Neurology

## 2020-02-16 ENCOUNTER — Telehealth: Payer: Self-pay | Admitting: *Deleted

## 2020-02-16 MED ORDER — AMPHETAMINE-DEXTROAMPHETAMINE 20 MG PO TABS: ORAL_TABLET | ORAL | 0 refills | Status: DC

## 2020-02-16 MED ORDER — SILODOSIN 8 MG PO CAPS: 8.0000 mg | ORAL_CAPSULE | Freq: Every day | ORAL | 3 refills | Status: DC

## 2020-02-16 NOTE — Addendum Note (Signed)
Addended byYetta Barre, Kadisha Goodine L on: 02/16/2020 10:00 AM   Modules accepted: Orders

## 2020-02-16 NOTE — Addendum Note (Signed)
Addended by: Despina Arias A on: 02/16/2020 12:47 PM   Modules accepted: Orders

## 2020-02-16 NOTE — Telephone Encounter (Signed)
Submitted PA silodosin on CMM. [facility] - PA Case ID: [id number]. Waiting on determination from [employer] Medicare Part D.

## 2020-02-16 NOTE — Telephone Encounter (Signed)
Pt has called for a refill on his amphetamine-dextroamphetamine (ADDERALL) 20 MG tablet to  [id number]  Pt is asking for a call from RN re: his tamsulosin (FLOMAX) 0.4 MG CAPS capsule, he stated he does not feel it is doing its job and would like to know about other medications.

## 2020-02-16 NOTE — Telephone Encounter (Signed)
PA Denied for silodosin. Pharmacy requesting that  terazosin (HYTRIN) 5 MG capsule be sent since this is on list of formulary meds. Will speak with MD to see if switch is ok.

## 2020-02-16 NOTE — Telephone Encounter (Addendum)
Checked drug registry. He last refilled rx adderall on 01/08/20 #60. Last seen 11/18/19 and next f/u 05/18/20.   Spoke with Dr. Epimenio Foot. Instead of flomax he recommends he try silodosin 8mg  po qd instead. If this is ineffective, we will refer him to urology at that point. I called patient and relayed this. He verbalized understanding.

## 2020-03-04 ENCOUNTER — Telehealth: Payer: Self-pay | Admitting: *Deleted

## 2020-03-04 NOTE — Telephone Encounter (Signed)
Faxed completed/signed Vumerity start form to [employer] at [phone]. Received fax confirmation. Emailed [employer] ([employer] rep). She is aware form faxed and they will reach out for consent.   Pt has about 1-2 months of Tecfidera left and aware [employer] will no longer be providing assistance/free drug d/t Tecfidera having a generic option. However, no assistance available for generic. He is agreeable to switch ti Vumerity.  He has a f/u 05/18/2020.

## 2020-03-09 NOTE — Telephone Encounter (Addendum)
PA vumerity submitted on CMM. Key: [id number]. Waiting on determination from [employer] Medicare part D.  Request Reference Number: [id number]. VUMERITY CAP 231MG  is approved through 10/08/2020. Your patient may now fill this prescription and it will be covered.

## 2020-03-11 ENCOUNTER — Other Ambulatory Visit: Payer: Self-pay | Admitting: *Deleted

## 2020-03-11 ENCOUNTER — Telehealth: Payer: Self-pay | Admitting: Neurology

## 2020-03-11 DIAGNOSIS — G35 Multiple sclerosis: Secondary | ICD-10-CM

## 2020-03-11 MED ORDER — VUMERITY 231 MG PO CPDR: 2.0000 | DELAYED_RELEASE_CAPSULE | Freq: Two times a day (BID) | ORAL | 11 refills | Status: DC

## 2020-03-11 NOTE — Telephone Encounter (Signed)
Called patient back to further discuss. He spoke with Crystal who told him we needed to send rx into [facility]. He is unsure exactly what was needed. Advised I will reach out to Crystal to get clarification and will call him back. He verbalized understanding. I emailed Crystal and waiting on message back.

## 2020-03-11 NOTE — Telephone Encounter (Signed)
Late entry from 03/10/20: Emailed Crystal P ([employer] rep) because I received a fax from [employer] that Kaidin Boehle DOB2052/05/20 copay is very high ([phone]) for the Vumerity.Asked he be screened for pt assistance.   Received email back from her 03/11/20 that she will reach out to the pt to screen him.

## 2020-03-11 NOTE — Telephone Encounter (Signed)
Received the following message back from [employer]. ([employer] rep): "He was enrolled in the Free Drug Program - [facility] will be filling the medication. I'll be following up to make sure everything goes smoothly. I expect them to be able to ship the medication early next week."

## 2020-03-11 NOTE — Telephone Encounter (Addendum)
Faxed printed/signed rx Vumerity to [facility] (for free drug) at [phone] per Crystal's request. Received fax confirmation.  Called pt to update him. He verbalized understanding.  He asked that his cell number be called in the future: [phone]. Advised I tried that earlier but I got automated system x2 that calls were not being accepted. He will look into this and will let us know once it is fixed. Advised [place] to use other number for right now.

## 2020-03-11 NOTE — Telephone Encounter (Signed)
Pt  Is asking for a call from RN to discuss his Vumerity

## 2020-03-15 NOTE — Telephone Encounter (Signed)
Received the following message via email from [employer] [facility] rep): "Mr. Robinsons Vumerity shipment is scheduled to be delivered 6/9."  Also received fax from [employer] that vumerity rx has been shipped, fax dated 03/12/20.

## 2020-03-25 NOTE — Telephone Encounter (Signed)
Error

## 2020-04-21 ENCOUNTER — Other Ambulatory Visit: Payer: Self-pay | Admitting: Neurology

## 2020-04-21 MED ORDER — AMPHETAMINE-DEXTROAMPHETAMINE 20 MG PO TABS: ORAL_TABLET | ORAL | 0 refills | Status: DC

## 2020-04-21 NOTE — Addendum Note (Signed)
Addended by: Lindell Spar C on: 04/21/2020 05:00 PM   Modules accepted: Orders

## 2020-04-21 NOTE — Telephone Encounter (Signed)
Pt is requesting a refill for amphetamine-dextroamphetamine (ADDERALL) 20 MG tablet.  Pharmacy[phone]

## 2020-04-26 MED ORDER — AMPHETAMINE-DEXTROAMPHETAMINE 20 MG PO TABS: ORAL_TABLET | ORAL | 0 refills | Status: DC

## 2020-04-26 NOTE — Telephone Encounter (Signed)
Pt has called to inform he has checked with [id number] and his amphetamine-dextroamphetamine (ADDERALL) 20 MG tablet.is not there.  Pt has been told it was sent to [facility] pt is not familiar with this pharmacy.  Pt only wants to get this at  [id number]

## 2020-04-26 NOTE — Addendum Note (Signed)
Addended by: Arther Abbott on: 04/26/2020 01:32 PM   Modules accepted: Orders

## 2020-05-18 ENCOUNTER — Ambulatory Visit (INDEPENDENT_AMBULATORY_CARE_PROVIDER_SITE_OTHER): Payer: Medicare Other | Admitting: Family Medicine

## 2020-05-18 ENCOUNTER — Encounter: Payer: Self-pay | Admitting: Family Medicine

## 2020-05-18 VITALS — BP 123/76 | HR 78 | Ht 72.0 in | Wt 183.0 lb

## 2020-05-18 DIAGNOSIS — G35 Multiple sclerosis: Secondary | ICD-10-CM

## 2020-05-18 DIAGNOSIS — K5900 Constipation, unspecified: Secondary | ICD-10-CM

## 2020-05-18 DIAGNOSIS — R3911 Hesitancy of micturition: Secondary | ICD-10-CM | POA: Diagnosis not present

## 2020-05-18 DIAGNOSIS — F09 Unspecified mental disorder due to known physiological condition: Secondary | ICD-10-CM | POA: Diagnosis not present

## 2020-05-18 DIAGNOSIS — Z79899 Other long term (current) drug therapy: Secondary | ICD-10-CM | POA: Diagnosis not present

## 2020-05-18 DIAGNOSIS — G35D Multiple sclerosis, unspecified: Secondary | ICD-10-CM

## 2020-05-18 DIAGNOSIS — R269 Unspecified abnormalities of gait and mobility: Secondary | ICD-10-CM | POA: Diagnosis not present

## 2020-05-18 MED ORDER — TAMSULOSIN HCL 0.4 MG PO CAPS: 0.4000 mg | ORAL_CAPSULE | Freq: Every day | ORAL | Status: DC

## 2020-05-18 NOTE — Progress Notes (Signed)
I have read the note, and I agree with the clinical assessment and plan.  Oaklyn Jakubek A. Westlynn Fifer, MD, PhD, FAAN Certified in Neurology, Clinical Neurophysiology, Sleep Medicine, Pain Medicine and Neuroimaging  [facility] [address], [address] [place], [place] [address] [phone]  

## 2020-05-18 NOTE — Patient Instructions (Addendum)
We will continue Vumerity for now. Please monitor symptoms closely. We will update labs today. Continue Adderall for fatigue and cognitive impairments.   I will stop terazosin and restart tamsulosin 0.4mg  at bedtime. I do recommend restarting Miralax 17g daily. Try to drink at least 1-2 bottles of water every day.   Consider using a cane as needed for stability. Also consider PT for strength training and balance training.   Stay as active as possible.   Follow up in 6 months    Multiple Sclerosis Multiple sclerosis (MS) is a disease of the brain, spinal cord, and optic nerves (central nervous system). It causes the body's disease-fighting (immune) system to destroy the protective covering (myelin sheath) around nerves in the brain. When this happens, signals (nerve impulses) going to and from the brain and spinal cord do not get sent properly or may not get sent at all. There are several types of MS:  Relapsing-remitting MS. This is the most common type. This causes sudden attacks of symptoms. After an attack, you may recover completely until the next attack, or some symptoms may remain permanently.  Secondary progressive MS. This usually develops after the onset of relapsing-remitting MS. Similar to relapsing-remitting MS, this type also causes sudden attacks of symptoms. Attacks may be less frequent, but symptoms slowly get worse (progress) over time.  Primary progressive MS. This causes symptoms that steadily progress over time. This type of MS does not cause sudden attacks of symptoms. The age of onset of MS varies, but it often develops between 38-6 years of age. MS is a lifelong (chronic) condition. There is no cure, but treatment can help slow down the progression of the disease. What are the causes? The cause of this condition is not known. What increases the risk? You are more likely to develop this condition if:  You are a woman.  You have a relative with MS. However, the  condition is not passed from parent to child (inherited).  You have a lack (deficiency) of vitamin D.  You smoke. MS is more common in the [place] than in the [place]. What are the signs or symptoms? Relapsing-remitting and secondary progressive MS cause symptoms to occur in episodes or attacks that may last weeks to months. There may be long periods between attacks in which there are almost no symptoms. Primary progressive MS causes symptoms to steadily progress after they develop. Symptoms of MS vary because of the many different ways it affects the central nervous system. The main symptoms include:  Vision problems and eye pain.  Numbness.  Weakness.  Inability to move your arms, hands, feet, or legs (paralysis).  Balance problems.  Shaking that you cannot control (tremors).  Muscle spasms.  Problems with thinking (cognitive changes). MS can also cause symptoms that are associated with the disease, but are not always the direct result of an MS attack. They may include:  Inability to control urination or bowel movements (incontinence).  Headaches.  Fatigue.  Inability to tolerate heat.  Emotional changes.  Depression.  Pain. How is this diagnosed? This condition is diagnosed based on:  Your symptoms.  A neurological exam. This involves checking central nervous system function, such as nerve function, reflexes, and coordination.  MRIs of the brain and spinal cord.  Lab tests, including a lumbar puncture that tests the fluid that surrounds the brain and spinal cord (cerebrospinal fluid).  Tests to measure the electrical activity of the brain in response to stimulation (evoked potentials).  How is this treated? There is no cure for MS, but medicines can help decrease the number and frequency of attacks and help relieve nuisance symptoms. Treatment options may include:  Medicines that reduce the frequency of attacks. These medicines  may be given by injection, by mouth (orally), or through an IV.  Medicines that reduce inflammation (steroids). These may provide short-term relief of symptoms.  Medicines to help control pain, depression, fatigue, or incontinence.  Vitamin D, if you have a deficiency.  Using devices to help you move around (assistive devices), such as braces, a cane, or a walker.  Physical therapy to strengthen and stretch your muscles.  Occupational therapy to help you with everyday tasks.  Alternative or complementary treatments such as exercise, massage, or acupuncture. Follow these instructions at home:  Take over-the-counter and prescription medicines only as told by your health care provider.  Do not drive or use heavy machinery while taking prescription pain medicine.  Use assistive devices as recommended by your physical therapist or your health care provider.  Exercise as directed by your health care provider.  Return to your normal activities as told by your health care provider. Ask your health care provider what activities are safe for you.  Reach out for support. Share your feelings with friends, family, or a support group.  Keep all follow-up visits as told by your health care provider and therapists. This is important. Where to find more information  National Multiple Sclerosis Society: https://www.nationalmssociety.org Contact a health care provider if:  You feel depressed.  You develop new pain or numbness.  You have tremors.  You have problems with sexual function. Get help right away if:  You develop paralysis.  You develop numbness.  You have problems with your bladder or bowel function.  You develop double vision.  You lose vision in one or both eyes.  You develop suicidal thoughts.  You develop severe confusion. If you ever feel like you may hurt yourself or others, or have thoughts about taking your own life, get help right away. You can go to your  nearest emergency department or call:  Your local emergency services (911 in the [place]  A suicide crisis helpline, such as the National Suicide Prevention Lifeline at 323-659-2775. This is open 24 hours a day. Summary  Multiple sclerosis (MS) is a disease of the central nervous system that causes the body's immune system to destroy the protective covering (myelin sheath) around nerves in the brain.  There are 3 types of MS: relapsing-remitting, secondary progressive, and primary progressive. Relapsing-remitting and secondary progressive MS cause symptoms to occur in episodes or attacks that may last weeks to months. Primary progressive MS causes symptoms to steadily progress after they develop.  There is no cure for MS, but medicines can help decrease the number and frequency of attacks and help relieve nuisance symptoms. Treatment may also include physical or occupational therapy.  If you develop numbness, paralysis, vision problems, or other neurological symptoms, get help right away. This information is not intended to replace advice given to you by your health care provider. Make sure you discuss any questions you have with your health care provider. Document Revised: 09/07/2017 Document Reviewed: 12/04/2016 [employer]vier Patient Education  2020 [employer].

## 2020-05-18 NOTE — Progress Notes (Signed)
PATIENT: Gabriel Hanson DOB: [birth year]  REASON FOR VISIT: follow up HISTORY FROM: patient  Chief Complaint  Patient presents with  . Follow-up    6 month f/u for MS. States he has been struggling with Vumerity.   . room 6    with brother     HISTORY OF PRESENT ILLNESS: Today 05/18/20 Gabriel Hanson is a 69 y.o. male here today for follow up for RRMS. He was switched to Vumerity in 03/2020 due to cost of Tecfidera. He has been taking Vumerity for about two months.   He does not feel that he is doing as well as he was on Vumerity. He has a harding time with short term memory. He feels that his brain fog is worse. He does not feel he is as strong as he normally is. He continues Adderall  1-2 tablets daily as needed for fatigue and brian fog. He feels that it does help.   He continues to have difficulty with his balance. He states that he does not feel "centered". He has to stand slowly. He has had 2 falls since last being seen, most recently was last week when he fell when he was backing up to the toilet. He lost balance and fell over to the floor. Fortunately, he denies injuries.   He has urinary frequency and hesitancy. He takes terazosin  at bedtime. He feels that tamsulosin worked better. He feels that he is up and down all night on the new medication. He feels this is contributing to his fatigue. He has a bowel movement about once per week. He was previously taking Miralax but has been out of this medication.    HISTORY: (copied from Dr Bonnita Hollow note on 11/18/2019)  Gabriel Hanson is a 69 y.o. man with relapsing remitting MS.       Update 11/18/2019: He has had trouble getting his Tecfidera.   He reports he was supposed to have a shipment but he had communications issues with insurance.   He is not sure how to get back in contact with them  He tolerated Tecfidera well and feels his MS was well controlled.   He has felt much worse the past couple months.   Adderall, even just  1/2 pill, helps his alertness.      He notes his gait is off balanced and this seems worse to him compared to last year.   Leg strength is fine.  He gets a cold paraestheia in the legs.   He has urinary hesitancy with urgency at times.      He has fatigue and cognitive fog --- both helped by Adderall.    He was sleeping well most nights but lately he notes he needs to go to bed earlier than he used to.    He also wakes up several times at night.   Mood is doing well.     Update 02/04/2019 (virtual) He just restarted Tecfidera after being off Jan-March due to an insurance miscommunication.  He tolerates Tecfidera well.   He has had no exacerbations.   He stumbles due to poor balance.  He has mild leg weakness.   Poor balance affects gait more than strength.  He also has a bad right knee (orthopedic).  He has tingling in the feet and ankles.   Tamsulosin helps the urinary retention.   He has constipation  He has some fatigue which was interfering with doing tasks.   He also noted a mental fog  with reduced focus and attention.  Both of these symptoms got much better.   Due to his impairments, he no longer drives.     1.   Continue Tecfidera.  We will check an MRI of the brain without contrast to determine if he is having a lot of subclinical progression.  If this is occurring consider a more efficacious disease modifying therapy. 2.   Continue Adderall 20 mg up to twice a day.  He does not take every day and often takes just 1 pill. 3.   renew tamsulosin. 4.   Stay active and exercise as tolerated.  We briefly discussed COVID-19 and MS and disease modifying therapies.  He could be at a mildly increased risk being on Tecfidera though it is likely safer than some other DMT's. 5.   Return to see me in 6 months or sooner if there are new or worsening neurologic symptoms.  Update 08/27/2018: He feels his MS is stable.    H is on Tecfidera and he tolerates it well.   He denies any exacerbation.     He  feels gait is stable.    He has more tingling in his legs/feet.   He notes mildly more weakness and feels a little more off balanced,    No falls.  Also has a torn meniscus in right knee and wears a brace.  Stairs are difficult.     He has urinary frequency as well as hesitancy/ difficulty emptying and also has constipation.   Tamsulosin has helped him empty better but he has some leakage.   We discussed seeing urology.     Miralax helps constipation some.     He stopped driving as he felt he was no longer safe.    Focus/attention has ben an issue and he also has visual problems.   The Adderall has greatly helped the focus.   It has helped processing speed and multitasking as well.      He is on Tecfidera and he tolerates it well.    Update 12/17/2017:   He ran out of Tecfidera about 2 weeks ago.   He denies any exacerbations or other new symptoms.  He was tolerating Tecfidera well.  His gait is a little wide due to leg weakness and stiffness.  Stiffness is worse upon standing up after sitting a while.    He has some tingling in his feet.     He has some urinary hesitancy.    Flomax has helped.     His vision is usually good but he notes blurriness when he is tired.    Adderall helps his cognition, especially focus and attention.  He is completing tasks more easily.   His fatigue may help the memory and cognition.   He seldom takes the pm dose as sleep was affected,   Sleep is good most nights without the second dose.   Marland Kitchen    Update 08/16/2017:   He feels his MS has been mostly stable. He is on Tecfidera. He tolerates it well.    He denies new issues with gait, strength or sensation.   His left leg is more affected from MS and right leg from ortho issues.  Both legs seem stiff and he takes a few extra seconds to stand up.     There is stable tingling in his toes that is not painful.      Bladder issues are worse but he ran out of Flomax.  He notes some visual blurring especially when he is tired.  But this is stable.  He has fatigue and the Ritalin has not helped.   In the past it helped cognition but more recently it did not help at all.    Cognition continues to be a major problem.   He has trouble with focus/attnetion, STM, verbal fluency.    He is sleeping fairly well at night.  Aleve has helped the leg pain.     ________________________________________________ From 04/10/2017: MS:    He was placed on Tecfidera are in 2015. Initially, he had good compliance but then in 2017 compliance was very poor and we considered a switch to Tysabri. However, the JCV Ab was middle positive at 0.99 and he did not start.   He would like to get back on Tecfidera.   Gait/strength/sensation:   He continues to note some difficulty with his gait. It is clumsy and he feels the left leg gives out at times. He was walking 5 miles a day but more recently is having difficulty walking 1 mile.  He also has some tingling in the feet bilaterally.   The arms have normal strength and sensation.    Vision:  He is noting more difficulty with his vision. He had optic neuritis on the right in the past and that eye has never been as strong as the left. However, more recently, the left eye also is having more difficulty.    Colors seem ok.    He had an episode of diplopia x 2 weeks many years ago and used an eye patch while driving.  No recent diplopia or eye pain now.    Bladder/bowel:   Urinary hesitancy was improved by tamsulosin. He noted a big difference when he went off the medication for a few days..   He has constipation with painful BM's only 1 -2 times a week. MiraLAX did not help at first then caused diarrhea.     Fatigue/Attention/Sleep:   He reports physical and mental fatigue.   He had ADD as a child and has had attentional problems.   He felt Ritalin helped his mental fog and attention more than his fatigue.     He sleeps well most nights having insomnia rarely.     He remains active and walks daily.      Mood:   He notes mild depression and he has a history of a severe major depression. He does not have any suicidal ideation at this time though he did in the past..   Occasionally, he might laugh if sad or cry if happy and is also had some tearful episodes  Cognition:   He has had difficulties with focus and attention. Ritalin has helped but he ran out. He felt he was able to get much more complex during the day.       B12:   His B12 was low and he is receiving IM injections and feels it has helpd fatigue and vision.  .   When last checked 02/14/2016, B12 was no longer low.  Knee pain:   He has had right knee pain due to a torn meniscus and now notes similar pain on his left.    MS History:  He was diagnosed with MS in 2001 when he presented with right visual loss.     He had an MRI worrisome for MS.   He was placed on IV steroids and vision got 80% better.    He was  placed on an interferon first and then Copaxone because he had tolerability issues.     For two years, he was without insurance and stopped all DMT.   I started seeing him in 2015 and we started Tecfidera.   He tolerates it well but sometimes forgets the second pill because all his medications are qAM.  MRI brain 2011 shows white matter foci in the periventricular and juxtacortical white matter consistent with the clinical diagnosis of MS. The right optic nerve was reduced in size. There w was one focus in the right pons and one in the left cerebellar hemisphere, as well. None of the foci were enhancing during that MRI.    REVIEW OF SYSTEMS: Out of a complete 14 system review of symptoms, the patient complains only of the following symptoms, brian fog, memory loss, imbalance, falls, urinary frequency, urinary hesitancy, constipation and all other reviewed systems are negative.  ALLERGIES: No Known Allergies  HOME MEDICATIONS: Outpatient Medications Prior to Visit  Medication Sig Dispense Refill  . amphetamine-dextroamphetamine  (ADDERALL) 20 MG tablet Take one or two pills a day as needed. 60 tablet 0  . Diroximel Fumarate (VUMERITY) 231 MG CPDR Take 2 capsules by mouth in the morning and at bedtime. 120 capsule 11  . naproxen sodium (ALEVE) 220 MG tablet Take 660 mg by mouth daily.    . TECFIDERA 240 MG CPDR Take 1 capsule by mouth twice daily 60 capsule 11  . terazosin (HYTRIN) 5 MG capsule Take 1 capsule (5 mg total) by mouth at bedtime. 90 capsule 3   No facility-administered medications prior to visit.    PAST MEDICAL HISTORY: Past Medical History:  Diagnosis Date  . Depressive disorder, not elsewhere classified   . Disorder of eye, unspecified   . Memory loss   . Multiple sclerosis (HCC)   . 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 Other psoriasis   . Psychosexual dysfunction, unspecified     PAST SURGICAL HISTORY: Past Surgical History:  Procedure Laterality Date  . KNEE ARTHROSCOPY     right   . TONSILLECTOMY      FAMILY HISTORY: Family History  Problem Relation Age of Onset  . Breast cancer Mother   . COPD Father   . Heart disease Unknown     SOCIAL HISTORY: Social History   Socioeconomic History  . Marital status: Single    Spouse name: Not on file  . Number of children: 0  . Years of education: Not on file  . Highest education level: Bachelor's degree (e.g., BA, AB, BS)  Occupational History  . Occupation: Energy manager: [employer]    Comment: Fulltime  Tobacco Use  . Smoking status: Heavy Tobacco Smoker    Packs/day: 1.00    Years: 44.00    Pack years: 44.00    Types: Cigarettes  . Smokeless tobacco: Never Used  Vaping Use  . Vaping Use: Every day  Substance and Sexual Activity  . Alcohol use: Yes    Alcohol/week: 0.0 standard drinks    Comment: Occasionally/fim  . Drug use: Yes    Types: Marijuana  . Sexual activity: Not on file  Other Topics Concern  . Not on file  Social History Narrative  . Not on file     Social Determinants of Health   Financial Resource Strain:   . Difficulty of Paying Living Expenses:   Food Insecurity:   . Worried About Programme researcher, broadcasting/film/video  in the Last Year:   . Ran Out of Food in the Last Year:   Transportation Needs:   . Freight forwarder (Medical):   Marland Kitchen Lack of Transportation (Non-Medical):   Physical Activity:   . Days of Exercise per Week:   . Minutes of Exercise per Session:   Stress:   . Feeling of Stress :   Social Connections:   . Frequency of Communication with Friends and Family:   . Frequency of Social Gatherings with Friends and Family:   . Attends Religious Services:   . Active Member of Clubs or Organizations:   . Attends Banker Meetings:   Marland Kitchen Marital Status:   Intimate Partner Violence:   . Fear of Current or Ex-Partner:   . Emotionally Abused:   Marland Kitchen Physically Abused:   . Sexually Abused:       PHYSICAL EXAM  Vitals:   05/18/20 1348  BP: 123/76  Pulse: 78  Weight: 183 lb (83 kg)  Height: 6' (1.829 m)   Body mass index is 24.82 kg/m.  Generalized: Well developed, in no acute distress  Cardiology: normal rate and rhythm, no murmur noted Respiratory: clear to auscultation bilaterally  Neurological examination  Mentation: Alert oriented to time, place, history taking. Follows all commands speech and language fluent Cranial nerve II-XII: Pupils were equal round reactive to light. Extraocular movements were full, visual field were full on confrontational test. Facial sensation and strength were normal. Uvula tongue midline. Head turning and shoulder shrug  were normal and symmetric. Motor: The motor testing reveals 5 over 5 strength of all 4 extremities with the exception of right hip flexion, patient reports pain with assessment today. He is wearing right knee brace. Good symmetric motor tone is noted throughout.  Sensory: Sensory testing is intact to soft touch on all 4 extremities. No evidence of extinction is  noted.  Coordination: Cerebellar testing reveals good finger-nose-finger and heel-to-shin bilaterally.  Gait and station: right limp with gait assessment, patient refuses tandem assessment.   Reflexes: Deep tendon reflexes are brisk but symmetric bilaterally.   DIAGNOSTIC DATA (LABS, IMAGING, TESTING) - I reviewed patient records, labs, notes, testing and imaging myself where available.  No flowsheet data found.   Lab Results  Component Value Date   WBC 6.5 11/18/2019   HGB 15.7 11/18/2019   HCT 46.6 11/18/2019   MCV 92 11/18/2019   PLT 264 11/18/2019      Component Value Date/Time   NA 142 09/28/2017 1000   K 4.4 09/28/2017 1000   CL 104 09/28/2017 1000   CO2 23 09/28/2017 1000   GLUCOSE 93 09/28/2017 1000   GLUCOSE 115 (H) 09/02/2012 1539   BUN 13 09/28/2017 1000   CREATININE 0.90 09/28/2017 1000   CALCIUM 9.4 09/28/2017 1000   PROT 6.8 09/28/2017 1000   ALBUMIN 4.6 09/28/2017 1000   AST 13 09/28/2017 1000   ALT 10 09/28/2017 1000   ALKPHOS 71 09/28/2017 1000   BILITOT 0.6 09/28/2017 1000   GFRNONAA 89 09/28/2017 1000   GFRAA 103 09/28/2017 1000   Lab Results  Component Value Date   CHOL 202 (H) 09/28/2017   HDL 47 09/28/2017   LDLCALC 135 (H) 09/28/2017   TRIG 101 09/28/2017   CHOLHDL 4.3 09/28/2017   No results found for: HGBA1C Lab Results  Component Value Date   VITAMINB12 >2000 (H) 02/14/2016   No results found for: TSH     ASSESSMENT AND PLAN 69 y.o. year old male  has a past medical history of Depressive disorder, not elsewhere classified, Disorder of eye, unspecified, Memory loss, Multiple sclerosis (HCC), Other persistent mental disorders due to conditions classified elsewhere, Other psoriasis, and Psychosexual dysfunction, unspecified. here with     ICD-10-CM   1. MULTIPLE SCLEROSIS  G35 CBC with Differential/Platelets    Hepatic Function Panel  2. High risk medication use  Z79.899 CBC with Differential/Platelets    Hepatic Function Panel   3. Cognitive dysfunction  F09   4. Urinary hesitancy  R39.11 tamsulosin (FLOMAX) capsule 0.4 mg  5. Gait disturbance  R26.9   6. Constipation, unspecified constipation type  K59.00     Gabriel Hanson is uncertain if he is happy with Vumerity. We have discussed considering alternative DMT, however, he has taken Vumerity for about 2 months. He wishes to continue current treatment plan and assess at next follow up. He has had no new numbness, weakness, or vision changes. He does feel brian fog and cognitive impairments are gradually worsening. We will continue Adderall 20mg  1-2 tablets daily. I have encouraged him to increase daily activity and memory compensation strategies advised. We will switch back to tamsulosin as this seemed to work better and nocturia could be contributing to fatigue and worsening of cognitive impairments. I have offered to send a prescription for Miralax but he prefers to get this OTC. I recommend 17g daily. He was encouraged to increase water intake. Fall precautions discussed. Cane for stability advised. PT offered but refused. We will update labs today. He will follow up in 6 months. May consider updating MRI at that time.    Orders Placed This Encounter  Procedures  . CBC with Differential/Platelets  . Hepatic Function Panel     Meds ordered this encounter  Medications  . tamsulosin (FLOMAX) capsule 0.4 mg      I spent 40 minutes with the patient. 50% of this time was spent counseling and educating patient on plan of care and medications.    , FNP-C 05/18/2020, 2:44 PM [facility] [address], [address] [place], [place] [phone]

## 2020-05-19 LAB — CBC WITH DIFFERENTIAL/PLATELET
Basophils Absolute: 0.1 10*3/uL (ref 0.0–0.2)
Basos: 1 %
EOS (ABSOLUTE): 0.1 10*3/uL (ref 0.0–0.4)
Eos: 1 %
Hematocrit: 39.4 % (ref 37.5–51.0)
Hemoglobin: 13.7 g/dL (ref 13.0–17.7)
Immature Grans (Abs): 0 10*3/uL (ref 0.0–0.1)
Immature Granulocytes: 0 %
Lymphocytes Absolute: 0.8 10*3/uL (ref 0.7–3.1)
Lymphs: 13 %
MCH: 31.4 pg (ref 26.6–33.0)
MCHC: 34.8 g/dL (ref 31.5–35.7)
MCV: 90 fL (ref 79–97)
Monocytes Absolute: 0.7 10*3/uL (ref 0.1–0.9)
Monocytes: 10 %
Neutrophils Absolute: 4.8 10*3/uL (ref 1.4–7.0)
Neutrophils: 75 %
Platelets: 252 10*3/uL (ref 150–450)
RBC: 4.36 x10E6/uL (ref 4.14–5.80)
RDW: 13.1 % (ref 11.6–15.4)
WBC: 6.5 10*3/uL (ref 3.4–10.8)

## 2020-05-19 LAB — HEPATIC FUNCTION PANEL
ALT: 11 IU/L (ref 0–44)
AST: 13 IU/L (ref 0–40)
Albumin: 4.3 g/dL (ref 3.8–4.8)
Alkaline Phosphatase: 63 IU/L (ref 48–121)
Bilirubin Total: 0.6 mg/dL (ref 0.0–1.2)
Bilirubin, Direct: 0.17 mg/dL (ref 0.00–0.40)
Total Protein: 6.5 g/dL (ref 6.0–8.5)

## 2020-05-20 ENCOUNTER — Telehealth: Payer: Self-pay

## 2020-05-20 NOTE — Telephone Encounter (Signed)
-----   Message from Shawnie Dapper, NP sent at 05/19/2020 10:35 AM EDT ----- Labs look great!

## 2020-05-20 NOTE — Telephone Encounter (Signed)
Pt verified by name and DOB,  normal results given per provider, pt voiced understanding all question answered. °

## 2020-05-31 ENCOUNTER — Telehealth: Payer: Self-pay | Admitting: Family Medicine

## 2020-05-31 MED ORDER — TAMSULOSIN HCL 0.4 MG PO CAPS: 0.4000 mg | ORAL_CAPSULE | Freq: Every day | ORAL | 3 refills | Status: DC

## 2020-05-31 NOTE — Addendum Note (Signed)
Addended by: Guy Begin on: 05/31/2020 04:50 PM   Modules accepted: Orders

## 2020-05-31 NOTE — Telephone Encounter (Signed)
Pt was not sure if he need refill for tamsulosin (FLOMAX) capsule 0.4 mg. Pt said he cannot remember if he is suppose to have a refill. Pt said pharmacy told him there is no refill for tamsulosin (FLOMAX) capsule 0.4 mg. Would like a call from the nurse.

## 2020-05-31 NOTE — Telephone Encounter (Signed)
I called pt and he went to pick up at [facility] and was not there.  I relayed that I will place order for him 90 day supply and then he can get there to take on cap daily.

## 2020-06-04 DIAGNOSIS — Z23 Encounter for immunization: Secondary | ICD-10-CM | POA: Diagnosis not present

## 2020-06-24 ENCOUNTER — Telehealth: Payer: Self-pay | Admitting: Family Medicine

## 2020-06-24 ENCOUNTER — Other Ambulatory Visit: Payer: Self-pay | Admitting: Family Medicine

## 2020-06-24 MED ORDER — AMPHETAMINE-DEXTROAMPHETAMINE 20 MG PO TABS: ORAL_TABLET | ORAL | 0 refills | Status: DC

## 2020-06-24 NOTE — Telephone Encounter (Signed)
error 

## 2020-06-24 NOTE — Telephone Encounter (Signed)
Pt called needing a refill on his amphetamine-dextroamphetamine (ADDERALL) 20 MG tablet sent in to the [facility] on [address].

## 2020-06-24 NOTE — Telephone Encounter (Signed)
Pt request refill amphetamine-dextroamphetamine (ADDERALL) 20 MG tablet at [id number]  

## 2020-08-09 DIAGNOSIS — Z23 Encounter for immunization: Secondary | ICD-10-CM | POA: Diagnosis not present

## 2020-09-08 ENCOUNTER — Other Ambulatory Visit: Payer: Self-pay | Admitting: Family Medicine

## 2020-09-08 NOTE — Telephone Encounter (Signed)
Pt request refill amphetamine-dextroamphetamine (ADDERALL) 20 MG tablet at [id number]  

## 2020-09-09 MED ORDER — AMPHETAMINE-DEXTROAMPHETAMINE 20 MG PO TABS: ORAL_TABLET | ORAL | 0 refills | Status: DC

## 2020-09-09 NOTE — Addendum Note (Signed)
Addended by: Rosezella [place] on: 09/09/2020 01:21 PM   Modules accepted: Orders

## 2020-09-09 NOTE — Telephone Encounter (Signed)
[place] [facility] Verified for  amphetamine-dextroamphetamine (ADDERALL) 20 MG tablet  LR:06/24/20 Qty: 60 for 30 Last OV: 05/18/20 Pending appointment:11/16/20

## 2020-10-13 ENCOUNTER — Telehealth: Payer: Self-pay | Admitting: Family Medicine

## 2020-10-13 NOTE — Telephone Encounter (Signed)
Pt. is requesting a refill for Diroximel Fumarate (VUMERITY) 231 MG CPDR.

## 2020-10-13 NOTE — Telephone Encounter (Signed)
I called pt and relayed he needs to call [facility] for them to send refill.  He has #.  States feels like memory not as good.  He appreciated call back.

## 2020-10-21 ENCOUNTER — Other Ambulatory Visit: Payer: Self-pay | Admitting: *Deleted

## 2020-10-21 ENCOUNTER — Telehealth: Payer: Self-pay | Admitting: *Deleted

## 2020-10-21 NOTE — Telephone Encounter (Signed)
Received PA request on CMM for Vumerity. [id number] Tried submitting and received the following response: "This medication or product was previously approved on [id number] from 2020-10-09 to 2021-10-08. **Please note: Formulary lowering, tiering exception, cost reduction and/or pre-benefit determination review (including prospective Medicare hospice reviews) requests cannot be requested using this method of submission. Please contact us at [phone] instead."

## 2020-11-08 ENCOUNTER — Other Ambulatory Visit: Payer: Self-pay | Admitting: Family Medicine

## 2020-11-08 MED ORDER — AMPHETAMINE-DEXTROAMPHETAMINE 20 MG PO TABS: ORAL_TABLET | ORAL | 0 refills | Status: DC

## 2020-11-08 NOTE — Telephone Encounter (Signed)
[place] [facility] Verified LR:09-09-20 Qty: 60 for 30 days  Last OV:05-18-20 Pending appointment: 11-16-20

## 2020-11-08 NOTE — Telephone Encounter (Signed)
Pt request refill amphetamine-dextroamphetamine (ADDERALL) 20 MG tablet [facility] [phone]

## 2020-11-16 ENCOUNTER — Ambulatory Visit (INDEPENDENT_AMBULATORY_CARE_PROVIDER_SITE_OTHER): Payer: Medicare Other | Admitting: Neurology

## 2020-11-16 ENCOUNTER — Encounter: Payer: Self-pay | Admitting: Neurology

## 2020-11-16 VITALS — BP 143/84 | HR 129 | Ht 72.0 in | Wt 183.0 lb

## 2020-11-16 DIAGNOSIS — G35 Multiple sclerosis: Secondary | ICD-10-CM

## 2020-11-16 DIAGNOSIS — N319 Neuromuscular dysfunction of bladder, unspecified: Secondary | ICD-10-CM

## 2020-11-16 DIAGNOSIS — E559 Vitamin D deficiency, unspecified: Secondary | ICD-10-CM

## 2020-11-16 DIAGNOSIS — Z79899 Other long term (current) drug therapy: Secondary | ICD-10-CM | POA: Diagnosis not present

## 2020-11-16 NOTE — Progress Notes (Signed)
[facility]  PATIENT: Gabriel Hanson DOB: Feb 22, 1951  REFERRING DOCTOR OR PCP:  None  SOURCE: patient  _________________________________   HISTORICAL  CHIEF COMPLAINT:  Chief Complaint  Patient presents with  . Follow-up    RM 12, alone. Last seen 05/18/2020 by AL,NP. On Vumerity for MS.  Did not tolerate well for first few months but feels he is doing well on this now. Having increased falls/stumbles. More balance issues. Denies any significant injuries. Denies hitting head.     HISTORY OF PRESENT ILLNESS:  Gabriel Hanson is a 70 y.o. man with relapsing remitting MS.       Update 11/16/2020: Due to insurance issues with Tecfidera we were able to get him started on Vumerity.   He had some difficulty tolerating it at first but now tolerates it well.      He notes his gait is off balanced, mildly worse over the last year.  Needs to hold onto furniture or walls when walking at times.  He has had trouble trying to use a cane.  He stumbles but no falls.    Leg strength is fine.  He notes his feet feel cold, initially only in his toes an now in his feet.      He has urinary hesitancy with urgency at times.   The tamsulosin helpsthe hesitancy but not the urgency.   He has difficulty with urinary hesitancy but also has urgency and frequency.  He has had incontinence.     He has fatigue and cognitive fog.  These are helped by Adderall, even at 1/2 pill.  He rarely takes more than 1 pill a day.     He was sleeping well most nights but lately he notes he needs to go to bed earlier than he used to.    He also wakes up several times at night.   Mood is doing well.       MS History:  He was diagnosed with MS in 2001 when he presented with right visual loss.     He had an MRI worrisome for MS.   He was placed on IV steroids and vision got 80% better.    He was placed on an interferon first and then Copaxone because he had tolerability issues.     For two years, he was without  insurance and stopped all DMT.   I started seeing him in 2015 and we started Tecfidera.   He tolerates it well but sometimes forgets the second pill because all his medications are qAM.  MRI brain 2011 shows white matter foci in the periventricular and juxtacortical white matter consistent with the clinical diagnosis of MS. The right optic nerve was reduced in size. There w was one focus in the right pons and one in the left cerebellar hemisphere, as well. None of the foci were enhancing during that MRI.   MRI 04/01/2019 brain showed supratentorial and infratentorial lesions unchanged. Compared to 02/22/2016  REVIEW OF SYSTEMS: Constitutional: No fevers, chills, sweats, or change in appetite.  He notes some fatigue. Occasional insomnia. Eyes: No visual changes, double vision, eye pain Ear, nose and throat: No hearing loss, ear pain, nasal congestion, sore throat Cardiovascular: No chest pain, palpitations Respiratory: No shortness of breath at rest or with exertion.   No wheezes GastrointestinaI: No nausea, vomiting, diarrhea, abdominal pain, fecal incontinence.  Severe constipation Genitourinary: as above.  Musculoskeletal: No neck pain, back pain Integumentary: No rash, pruritus, skin lesions Neurological: as above  Psychiatric: Mild depression. Cognitive decline. Endocrine: No palpitations, diaphoresis, change in appetite, change in weigh or increased thirst Hematologic/Lymphatic: No anemia, purpura, petechiae. Allergic/Immunologic: No itchy/runny eyes, nasal congestion, recent allergic reactions, rashes  ALLERGIES: No Known Allergies  HOME MEDICATIONS:  Current Outpatient Medications:  .  amphetamine-dextroamphetamine (ADDERALL) 20 MG tablet, Take one or two pills a day as needed., Disp: 60 tablet, Rfl: 0 .  Diroximel Fumarate (VUMERITY) 231 MG CPDR, Take 2 capsules by mouth in the morning and at bedtime., Disp: 120 capsule, Rfl: 11 .  naproxen sodium (ALEVE) 220 MG tablet, Take 660  mg by mouth daily., Disp: , Rfl:  .  tamsulosin (FLOMAX) 0.4 MG CAPS capsule, Take 1 capsule (0.4 mg total) by mouth daily., Disp: 90 capsule, Rfl: 3  PAST MEDICAL HISTORY: Past Medical History:  Diagnosis Date  . Depressive disorder, not elsewhere classified   . Disorder of eye, unspecified   . Memory loss   . Multiple sclerosis (HCC)   . 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 Other psoriasis   . Psychosexual dysfunction, unspecified     PAST SURGICAL HISTORY: Past Surgical History:  Procedure Laterality Date  . KNEE ARTHROSCOPY     right   . TONSILLECTOMY      FAMILY HISTORY: Family History  Problem Relation Age of Onset  . Breast cancer Mother   . COPD Father   . Heart disease Unknown     SOCIAL HISTORY:  Social History   Socioeconomic History  . Marital status: Single    Spouse name: Not on file  . Number of children: 0  . Years of education: Not on file  . Highest education level: Bachelor's degree (e.g., BA, AB, BS)  Occupational History  . Occupation: Energy manager: [employer]    Comment: Fulltime  Tobacco Use  . Smoking status: Heavy Tobacco Smoker    Packs/day: 1.00    Years: 44.00    Pack years: 44.00    Types: Cigarettes  . Smokeless tobacco: Never Used  Vaping Use  . Vaping Use: Every day  Substance and Sexual Activity  . Alcohol use: Yes    Alcohol/week: 0.0 standard drinks    Comment: Occasionally/fim  . Drug use: Yes    Types: Marijuana  . Sexual activity: Not on file  Other Topics Concern  . Not on file  Social History Narrative  . Not on file   Social Determinants of Health   Financial Resource Strain: Not on file  Food Insecurity: Not on file  Transportation Needs: Not on file  Physical Activity: Not on file  Stress: Not on file  Social Connections: Not on file  Intimate Partner Violence: Not on file     PHYSICAL EXAM  Vitals:   11/16/20 1138  BP: (!) 143/84   Pulse: (!) 129  Weight: 183 lb (83 kg)  Height: 6' (1.829 m)    Body mass index is 24.82 kg/m.   General: The patient is well-developed and well-nourished and in no acute distress  Neurologic Exam  Mental status: The patient is alert and oriented x 3 at the time of the examination.   Ok memory but some distractibility.   Speech is normal.  Cranial nerves: Extraocular movements are full. He has a 1+ APD on the right.   Vision is blurry and colors desaturated on the right.   Facial strength and sensation is normal. Trapezius strength is normal. No obvious hearing deficits  are noted.  Motor:  Muscle bulk is normal.   Tone is slightly increased n legs.  . Strength is  5 / 5 in all 4 extremities   Sensory: Sensation is normal in his arms but reduced in the left foot to touch  Coordination: Cerebellar testing shows good bilateral finger-nose-finger slightly but reduced bilateral heel-to-shin  Gait and station: Station is normal.   The gait is wide with a mild right foot drop  Unable to do tandem gait. Romberg is negative.   Reflexes: Deep tendon reflexes are symmetric and mildly increased in the Legs.     DIAGNOSTIC DATA (LABS, IMAGING, TESTING) - I reviewed patient records, labs, notes, testing and imaging myself where available.  Lab Results  Component Value Date   WBC 6.5 05/18/2020   HGB 13.7 05/18/2020   HCT 39.4 05/18/2020   MCV 90 05/18/2020   PLT 252 05/18/2020      Component Value Date/Time   NA 142 09/28/2017 1000   K 4.4 09/28/2017 1000   CL 104 09/28/2017 1000   CO2 23 09/28/2017 1000   GLUCOSE 93 09/28/2017 1000   GLUCOSE 115 (H) 09/02/2012 1539   BUN 13 09/28/2017 1000   CREATININE 0.90 09/28/2017 1000   CALCIUM 9.4 09/28/2017 1000   PROT 6.5 05/18/2020 1438   ALBUMIN 4.3 05/18/2020 1438   AST 13 05/18/2020 1438   ALT 11 05/18/2020 1438   ALKPHOS 63 05/18/2020 1438   BILITOT 0.6 05/18/2020 1438   GFRNONAA 89 09/28/2017 1000   GFRAA 103 09/28/2017  1000       ASSESSMENT AND PLAN  MULTIPLE SCLEROSIS - Plan: Ambulatory referral to Urology, Hepatic function panel  High risk medication use - Plan: Ambulatory referral to Urology, Hepatic function panel  Bladder dysfunction - Plan: CBC with Differential/Platelet  Vitamin D deficiency - Plan: VITAMIN D 25 Hydroxy (Vit-D Deficiency, Fractures)  1.   Continue Vumerity.   Check labs.    2.   Adderall 10 mg once or twice a day 3.   Refer to urology.  Has both hesitancy and urgency.  ? DSD 4.   rtc 6 months.   Gevork Ayyad A. Epimenio Foot, MD, PhD 11/16/2020, 10:08 PM Certified in Neurology, Clinical Neurophysiology, Sleep Medicine, Pain Medicine and Neuroimaging  [facility] [address], [address] [place], [place] [address] [phone]

## 2020-11-17 LAB — CBC WITH DIFFERENTIAL/PLATELET
Basophils Absolute: 0.1 10*3/uL (ref 0.0–0.2)
Basos: 1 %
EOS (ABSOLUTE): 0 10*3/uL (ref 0.0–0.4)
Eos: 0 %
Hematocrit: 46.2 % (ref 37.5–51.0)
Hemoglobin: 15.8 g/dL (ref 13.0–17.7)
Immature Grans (Abs): 0 10*3/uL (ref 0.0–0.1)
Immature Granulocytes: 0 %
Lymphocytes Absolute: 0.7 10*3/uL (ref 0.7–3.1)
Lymphs: 7 %
MCH: 30.4 pg (ref 26.6–33.0)
MCHC: 34.2 g/dL (ref 31.5–35.7)
MCV: 89 fL (ref 79–97)
Monocytes Absolute: 0.6 10*3/uL (ref 0.1–0.9)
Monocytes: 6 %
Neutrophils Absolute: 8.3 10*3/uL — ABNORMAL HIGH (ref 1.4–7.0)
Neutrophils: 86 %
Platelets: 308 10*3/uL (ref 150–450)
RBC: 5.2 x10E6/uL (ref 4.14–5.80)
RDW: 11.9 % (ref 11.6–15.4)
WBC: 9.7 10*3/uL (ref 3.4–10.8)

## 2020-11-17 LAB — VITAMIN D 25 HYDROXY (VIT D DEFICIENCY, FRACTURES): Vit D, 25-Hydroxy: 38.5 ng/mL (ref 30.0–100.0)

## 2020-11-17 LAB — HEPATIC FUNCTION PANEL
ALT: 13 IU/L (ref 0–44)
AST: 18 IU/L (ref 0–40)
Albumin: 4.6 g/dL (ref 3.8–4.8)
Alkaline Phosphatase: 100 IU/L (ref 44–121)
Bilirubin Total: 0.5 mg/dL (ref 0.0–1.2)
Bilirubin, Direct: <0.1 mg/dL (ref 0.00–0.40)
Total Protein: 6.8 g/dL (ref 6.0–8.5)

## 2020-12-13 ENCOUNTER — Telehealth: Payer: Self-pay | Admitting: Neurology

## 2020-12-13 DIAGNOSIS — G35 Multiple sclerosis: Secondary | ICD-10-CM

## 2020-12-13 MED ORDER — VUMERITY 231 MG PO CPDR: 2.0000 | DELAYED_RELEASE_CAPSULE | Freq: Two times a day (BID) | ORAL | 11 refills | Status: DC

## 2020-12-13 NOTE — Telephone Encounter (Signed)
Pt. is requesting a refill for Diroximel Fumarate (VUMERITY) 231 MG CPDR.  Pharmacy: [id number]

## 2020-12-13 NOTE — Addendum Note (Signed)
Addended by: Arther Abbott on: 12/13/2020 01:25 PM   Modules accepted: Orders

## 2020-12-13 NOTE — Telephone Encounter (Signed)
Faxed printed/signed rx to [facility] at [phone]. Received fax confirmation.

## 2020-12-13 NOTE — Telephone Encounter (Signed)
Rx printed. Waiting on MD signature and then will fax into pharmacy

## 2021-01-04 ENCOUNTER — Telehealth: Payer: Self-pay | Admitting: Neurology

## 2021-01-04 DIAGNOSIS — G35 Multiple sclerosis: Secondary | ICD-10-CM

## 2021-01-04 MED ORDER — VUMERITY 231 MG PO CPDR: 2.0000 | DELAYED_RELEASE_CAPSULE | Freq: Two times a day (BID) | ORAL | 11 refills | Status: DC

## 2021-01-04 NOTE — Telephone Encounter (Signed)
E-scribed refill to [facility]

## 2021-01-04 NOTE — Telephone Encounter (Signed)
[employer]/Financial Assistance (June) request refill Diroximel Fumarate (VUMERITY) 231 MG CPDR sent to [facility] Contact info: [phone]

## 2021-01-18 ENCOUNTER — Telehealth: Payer: Self-pay | Admitting: Neurology

## 2021-01-18 ENCOUNTER — Other Ambulatory Visit: Payer: Self-pay | Admitting: *Deleted

## 2021-01-18 DIAGNOSIS — G35 Multiple sclerosis: Secondary | ICD-10-CM

## 2021-01-18 DIAGNOSIS — N319 Neuromuscular dysfunction of bladder, unspecified: Secondary | ICD-10-CM

## 2021-01-18 MED ORDER — VUMERITY 231 MG PO CPDR: 2.0000 | DELAYED_RELEASE_CAPSULE | Freq: Two times a day (BID) | ORAL | 11 refills | Status: DC

## 2021-01-18 NOTE — Telephone Encounter (Signed)
E-scribed refill as requested. 

## 2021-01-18 NOTE — Telephone Encounter (Signed)
[facility] Kara Pacer) request refill Diroximel Fumarate (VUMERITY) 231 MG CPDR at [facility].

## 2021-01-18 NOTE — Telephone Encounter (Signed)
Called back and spoke with Hough. Advised Dr. Epimenio Foot aware he ran out, he is to dispense maintenance dose. Pt aware to take 1 cap po BID x7 days and then go up to the full dose. Scott verbalized understanding.

## 2021-01-18 NOTE — Telephone Encounter (Signed)
Luma@ [facility] has called to report the have the script for the maintenance dose of the Vumerity.  They need the script for the starter dose. Luma@ [facility] states RN can call [phone] option 2

## 2021-03-10 ENCOUNTER — Other Ambulatory Visit: Payer: Self-pay | Admitting: Neurology

## 2021-03-10 MED ORDER — AMPHETAMINE-DEXTROAMPHETAMINE 20 MG PO TABS: ORAL_TABLET | ORAL | 0 refills | Status: DC

## 2021-03-10 NOTE — Telephone Encounter (Signed)
Pt request refill amphetamine-dextroamphetamine (ADDERALL) 20 MG tablet [facility] [phone]

## 2021-03-12 ENCOUNTER — Other Ambulatory Visit: Payer: Self-pay | Admitting: Neurology

## 2021-03-21 ENCOUNTER — Other Ambulatory Visit: Payer: Self-pay | Admitting: Neurology

## 2021-03-21 MED ORDER — AMPHETAMINE-DEXTROAMPHETAMINE 20 MG PO TABS: ORAL_TABLET | ORAL | 0 refills | Status: DC

## 2021-03-21 NOTE — Telephone Encounter (Signed)
Pt called stating he is needing a refill on his amphetamine-dextroamphetamine (ADDERALL) 20 MG tablet [facility] [address].

## 2021-05-18 ENCOUNTER — Ambulatory Visit: Payer: Medicare Other | Admitting: Neurology

## 2021-05-31 ENCOUNTER — Other Ambulatory Visit: Payer: Self-pay | Admitting: Neurology

## 2021-05-31 MED ORDER — AMPHETAMINE-DEXTROAMPHETAMINE 20 MG PO TABS: ORAL_TABLET | ORAL | 0 refills | Status: DC

## 2021-05-31 NOTE — Telephone Encounter (Signed)
Received refill request for Adderall 20mg . Last OV was on 11/16/20. Next OV is scheduled for 06/14/21.  Last RX was written on 03/21/21 for 60 tabs.   [place] [facility] Database has been reviewed.

## 2021-05-31 NOTE — Telephone Encounter (Signed)
Pt request refill amphetamine-dextroamphetamine (ADDERALL) 20 MG tablet at [id number]  

## 2021-06-06 ENCOUNTER — Other Ambulatory Visit: Payer: Self-pay | Admitting: Neurology

## 2021-06-06 DIAGNOSIS — G35 Multiple sclerosis: Secondary | ICD-10-CM

## 2021-06-06 MED ORDER — VUMERITY 231 MG PO CPDR: 2.0000 | DELAYED_RELEASE_CAPSULE | Freq: Two times a day (BID) | ORAL | 6 refills | Status: DC

## 2021-06-06 MED ORDER — AMPHETAMINE-DEXTROAMPHETAMINE 20 MG PO TABS: ORAL_TABLET | ORAL | 0 refills | Status: DC

## 2021-06-06 NOTE — Telephone Encounter (Signed)
Pt called requesting refill for amphetamine-dextroamphetamine (ADDERALL) 20 MG tablet & Diroximel Fumarate (VUMERITY) 231 MG CPDR. [facility] [phone].

## 2021-06-06 NOTE — Telephone Encounter (Signed)
Received refill request for Adderall and Vumerity Last OV was on 11/16/20.  Next OV is scheduled for 06/14/21.   Last RX for Adderall was written on 03/21/21 for 60 tabs.  Pt is [facility] [phone]   Drug Database has been reviewed.

## 2021-06-10 ENCOUNTER — Telehealth: Payer: Self-pay | Admitting: Neurology

## 2021-06-14 ENCOUNTER — Other Ambulatory Visit: Payer: Self-pay

## 2021-06-14 ENCOUNTER — Ambulatory Visit (INDEPENDENT_AMBULATORY_CARE_PROVIDER_SITE_OTHER): Payer: Medicare Other | Admitting: Neurology

## 2021-06-14 ENCOUNTER — Encounter: Payer: Self-pay | Admitting: Neurology

## 2021-06-14 ENCOUNTER — Telehealth: Payer: Self-pay | Admitting: Neurology

## 2021-06-14 VITALS — BP 124/79 | HR 96 | Ht 73.0 in | Wt 175.0 lb

## 2021-06-14 DIAGNOSIS — R3911 Hesitancy of micturition: Secondary | ICD-10-CM | POA: Diagnosis not present

## 2021-06-14 DIAGNOSIS — Z79899 Other long term (current) drug therapy: Secondary | ICD-10-CM | POA: Diagnosis not present

## 2021-06-14 DIAGNOSIS — G35 Multiple sclerosis: Secondary | ICD-10-CM

## 2021-06-14 DIAGNOSIS — R269 Unspecified abnormalities of gait and mobility: Secondary | ICD-10-CM

## 2021-06-14 DIAGNOSIS — F09 Unspecified mental disorder due to known physiological condition: Secondary | ICD-10-CM

## 2021-06-14 DIAGNOSIS — N319 Neuromuscular dysfunction of bladder, unspecified: Secondary | ICD-10-CM | POA: Diagnosis not present

## 2021-06-14 MED ORDER — TAMSULOSIN HCL 0.4 MG PO CAPS: 0.8000 mg | ORAL_CAPSULE | Freq: Every day | ORAL | 3 refills | Status: DC

## 2021-06-14 NOTE — Progress Notes (Signed)
[facility]  PATIENT: Gabriel Hanson DOB: 04-10-1951  REFERRING DOCTOR OR PCP:  None  SOURCE: patient  _________________________________   HISTORICAL  CHIEF COMPLAINT:  Chief Complaint  Patient presents with   Follow-up    Pt alone, rm 2. MS follow up. Last ov 11/16/20. Pt is on Vumerity.    HISTORY OF PRESENT ILLNESS:  Gabriel Hanson is a 70 y.o. man with relapsing remitting MS.       Update 06/14/2021: He is on Vumerity.   He had some difficulty tolerating it at first but now tolerates it well.      No exacerbation but he feels he is stumbling more and having more bladder issues.    He notes his gait is off balanced, mildly worse over the last year.  Needs to hold onto furniture or walls when walking at times.  He has had trouble trying to use a cane.  He stumbles but no falls.    Leg strength is fine.  He notes his feet feel cold, initially only in his toes an now in his feet.      He has urinary hesitancy and feels he does not empty.     The tamsulosin helps the hesitancy but not the urgency.   He has constipation.   He took Miralax daily x 5 days without benefit so stopped  He has fatigue and cognitive fog.  Adderall 1/2 pill most days and 1 pill if more active has helped.   He rarely takes more than 1 pill a day.     He was sleeping well most nights but lately he notes he needs to go to bed earlier than he used to.    He also wakes up several times at night.   Mood is doing well.      MS History:  He was diagnosed with MS in 2001 when he presented with right visual loss.     He had an MRI worrisome for MS.   He was placed on IV steroids and vision got 80% better.    He was placed on an interferon first and then Copaxone because he had tolerability issues.     For two years, he was without insurance and stopped all DMT.   I started seeing him in 2015 and we started Tecfidera.   He tolerates it well but sometimes forgets the second pill because all his  medications are qAM.  MRI brain 2011 shows white matter foci in the periventricular and juxtacortical white matter consistent with the clinical diagnosis of MS. The right optic nerve was reduced in size. There w was one focus in the right pons and one in the left cerebellar hemisphere, as well. None of the foci were enhancing during that MRI.   MRI 04/01/2019 brain showed supratentorial and infratentorial lesions unchanged. Compared to 02/22/2016  REVIEW OF SYSTEMS: Constitutional: No fevers, chills, sweats, or change in appetite.  He notes some fatigue. Occasional insomnia. Eyes: No visual changes, double vision, eye pain Ear, nose and throat: No hearing loss, ear pain, nasal congestion, sore throat Cardiovascular: No chest pain, palpitations Respiratory:  No shortness of breath at rest or with exertion.   No wheezes GastrointestinaI: No nausea, vomiting, diarrhea, abdominal pain, fecal incontinence.  Severe constipation Genitourinary:  as above.  Musculoskeletal:  No neck pain, back pain Integumentary: No rash, pruritus, skin lesions Neurological: as above Psychiatric: Mild depression. Cognitive decline. Endocrine: No palpitations, diaphoresis, change in appetite, change in weigh or  increased thirst Hematologic/Lymphatic:  No anemia, purpura, petechiae. Allergic/Immunologic: No itchy/runny eyes, nasal congestion, recent allergic reactions, rashes  ALLERGIES: No Known Allergies  HOME MEDICATIONS:  Current Outpatient Medications:    amphetamine-dextroamphetamine (ADDERALL) 20 MG tablet, Take one or two pills a day as needed., Disp: 60 tablet, Rfl: 0   Diroximel Fumarate (VUMERITY) 231 MG CPDR, Take 2 capsules by mouth 2 (two) times daily., Disp: 120 capsule, Rfl: 11   Diroximel Fumarate (VUMERITY) 231 MG CPDR, Take 2 capsules by mouth 2 (two) times daily., Disp: 120 capsule, Rfl: 6   naproxen sodium (ALEVE) 220 MG tablet, Take 660 mg by mouth daily., Disp: , Rfl:    tamsulosin (FLOMAX)  0.4 MG CAPS capsule, Take 1 capsule (0.4 mg total) by mouth daily., Disp: 90 capsule, Rfl: 3  PAST MEDICAL HISTORY: Past Medical History:  Diagnosis Date   Depressive disorder, not elsewhere classified    Disorder of eye, unspecified    Memory loss    Multiple sclerosis (HCC)    Other persistent mental disorders due to conditions classified elsewhere    Other psoriasis    Psychosexual dysfunction, unspecified     PAST SURGICAL HISTORY: Past Surgical History:  Procedure Laterality Date   KNEE ARTHROSCOPY     right    TONSILLECTOMY      FAMILY HISTORY: Family History  Problem Relation Age of Onset   Breast cancer Mother    COPD Father    Heart disease Unknown     SOCIAL HISTORY:  Social History   Socioeconomic History   Marital status: Single    Spouse name: Not on file   Number of children: 0   Years of education: Not on file   Highest education level: Bachelor's degree (e.g., BA, AB, BS)  Occupational History   Occupation: Energy manager: [employer]    Comment: Fulltime  Tobacco Use   Smoking status: Heavy Smoker    Packs/day: 1.00    Years: 44.00    Pack years: 44.00    Types: Cigarettes   Smokeless tobacco: Never  Vaping Use   Vaping Use: Every day  Substance and Sexual Activity   Alcohol use: Yes    Alcohol/week: 0.0 standard drinks    Comment: Occasionally/fim   Drug use: Yes    Types: Marijuana   Sexual activity: Not on file  Other Topics Concern   Not on file  Social History Narrative   Not on file   Social Determinants of Health   Financial Resource Strain: Not on file  Food Insecurity: Not on file  Transportation Needs: Not on file  Physical Activity: Not on file  Stress: Not on file  Social Connections: Not on file  Intimate Partner Violence: Not on file     PHYSICAL EXAM  Vitals:   06/14/21 1049  BP: 124/79  Pulse: 96  Weight: 175 lb (79.4 kg)  Height: 6\' 1"  (1.854 m)    Body mass index is 23.09  kg/m.   General: The patient is well-developed and well-nourished and in no acute distress  Neurologic Exam  Mental status: The patient is alert and oriented x 3 at the time of the examination.   Ok memory but some distractibility.   Speech is normal.  Cranial nerves: Extraocular movements show a left INO. He has a 1+ APD on the right.   Vision is blurry and colors desaturated on the right.   Facial strength and sensation is normal. Trapezius strength is normal.  No obvious hearing deficits are noted.  Motor:  Muscle bulk is normal.   Tone is slightly increased n legs.  . Strength is  5 / 5 in all 4 extremities   However, reduced RAM in left arm and foot    Sensory: Sensation is normal in his arms but reduced in the left foot to touch  Coordination: Cerebellar testing shows good bilateral finger-nose-finger slightly but reduced bilateral heel-to-shin  Gait and station: Station is normal.   The gait is wide with a mild left  foot drop and gait is spastic  Unable to do tandem gait. Romberg is negative.   Reflexes: Deep tendon reflexes are symmetric and mildly increased in the Legs.     DIAGNOSTIC DATA (LABS, IMAGING, TESTING) - I reviewed patient records, labs, notes, testing and imaging myself where available.  Lab Results  Component Value Date   WBC 9.7 11/16/2020   HGB 15.8 11/16/2020   HCT 46.2 11/16/2020   MCV 89 11/16/2020   PLT 308 11/16/2020      Component Value Date/Time   NA 142 09/28/2017 1000   K 4.4 09/28/2017 1000   CL 104 09/28/2017 1000   CO2 23 09/28/2017 1000   GLUCOSE 93 09/28/2017 1000   GLUCOSE 115 (H) 09/02/2012 1539   BUN 13 09/28/2017 1000   CREATININE 0.90 09/28/2017 1000   CALCIUM 9.4 09/28/2017 1000   PROT 6.8 11/16/2020 1318   ALBUMIN 4.6 11/16/2020 1318   AST 18 11/16/2020 1318   ALT 13 11/16/2020 1318   ALKPHOS 100 11/16/2020 1318   BILITOT 0.5 11/16/2020 1318   GFRNONAA 89 09/28/2017 1000   GFRAA 103 09/28/2017 1000        ASSESSMENT AND PLAN  Multiple sclerosis (HCC)  Bladder dysfunction  Cognitive dysfunction  High risk medication use  Urinary hesitancy  Gait disturbance   1.   Continue Vumerity.   Check labs.    2.   Adderall 10 mg once or twice a day 3.    Has hesitancy >  urgency.  Try higher dose of tamsulosin 4.    rtc 6 months.   Tramya Schoenfelder A. Epimenio Foot, MD, PhD 06/14/2021, 11:37 AM Certified in Neurology, Clinical Neurophysiology, Sleep Medicine, Pain Medicine and Neuroimaging  [facility] [address] [place], [place] [address] [phone]

## 2021-06-14 NOTE — Telephone Encounter (Signed)
Medicare order sent to GI. NPR they will reach out to the patient to schedule.  

## 2021-06-15 LAB — CBC WITH DIFFERENTIAL/PLATELET
Basophils Absolute: 0.1 10*3/uL (ref 0.0–0.2)
Basos: 1 %
EOS (ABSOLUTE): 0.1 10*3/uL (ref 0.0–0.4)
Eos: 1 %
Hematocrit: 45.1 % (ref 37.5–51.0)
Hemoglobin: 15.2 g/dL (ref 13.0–17.7)
Immature Grans (Abs): 0 10*3/uL (ref 0.0–0.1)
Immature Granulocytes: 0 %
Lymphocytes Absolute: 0.7 10*3/uL (ref 0.7–3.1)
Lymphs: 10 %
MCH: 31.1 pg (ref 26.6–33.0)
MCHC: 33.7 g/dL (ref 31.5–35.7)
MCV: 92 fL (ref 79–97)
Monocytes Absolute: 0.5 10*3/uL (ref 0.1–0.9)
Monocytes: 8 %
Neutrophils Absolute: 5.2 10*3/uL (ref 1.4–7.0)
Neutrophils: 80 %
Platelets: 248 10*3/uL (ref 150–450)
RBC: 4.89 x10E6/uL (ref 4.14–5.80)
RDW: 12.9 % (ref 11.6–15.4)
WBC: 6.6 10*3/uL (ref 3.4–10.8)

## 2021-06-15 LAB — HEPATIC FUNCTION PANEL
ALT: 8 IU/L (ref 0–44)
AST: 12 IU/L (ref 0–40)
Albumin: 4.7 g/dL (ref 3.8–4.8)
Alkaline Phosphatase: 71 IU/L (ref 44–121)
Bilirubin Total: 1.4 mg/dL — ABNORMAL HIGH (ref 0.0–1.2)
Bilirubin, Direct: 0.33 mg/dL (ref 0.00–0.40)
Total Protein: 6.7 g/dL (ref 6.0–8.5)

## 2021-06-16 NOTE — Telephone Encounter (Signed)
Error

## 2021-06-26 ENCOUNTER — Ambulatory Visit
Admission: RE | Admit: 2021-06-26 | Discharge: 2021-06-26 | Disposition: A | Discharge status: Home / Self Care | Payer: Medicare Other | Source: Ambulatory Visit | Attending: Neurology | Admitting: Neurology

## 2021-06-26 ENCOUNTER — Other Ambulatory Visit: Payer: Self-pay

## 2021-06-26 DIAGNOSIS — G35 Multiple sclerosis: Secondary | ICD-10-CM | POA: Diagnosis not present

## 2021-06-26 MED ORDER — GADOBENATE DIMEGLUMINE 529 MG/ML IV SOLN
16.0000 mL | Freq: Once | INTRAVENOUS | Status: AC | PRN
  Administered 2021-06-26: 16 mL via INTRAVENOUS

## 2021-08-29 ENCOUNTER — Other Ambulatory Visit: Payer: Self-pay | Admitting: Neurology

## 2021-08-29 MED ORDER — AMPHETAMINE-DEXTROAMPHETAMINE 20 MG PO TABS: ORAL_TABLET | ORAL | 0 refills | Status: DC

## 2021-08-29 NOTE — Telephone Encounter (Addendum)
Tried calling pt back twice, phone # not in service. Will route to MD to e-scribe refill. Checked drug registry. He last refilled 06/06/21 #60. Last seen 06/14/21 and next follow 12/15/21.

## 2021-08-29 NOTE — Telephone Encounter (Signed)
Pt requesting refill for amphetamine-dextroamphetamine (ADDERALL) 20 MG tablet. Pharmacy  [id number] - [place], [place] - [address]. Pt asking once it has been sent to the pharmacy to give him a call.

## 2021-08-29 NOTE — Addendum Note (Signed)
Addended by: Arther Abbott on: 08/29/2021 01:21 PM   Modules accepted: Orders

## 2021-10-11 ENCOUNTER — Telehealth: Payer: Self-pay | Admitting: Neurology

## 2021-10-11 DIAGNOSIS — G35 Multiple sclerosis: Secondary | ICD-10-CM

## 2021-10-11 MED ORDER — VUMERITY 231 MG PO CPDR: 2.0000 | DELAYED_RELEASE_CAPSULE | Freq: Two times a day (BID) | ORAL | 1 refills | Status: DC

## 2021-10-11 NOTE — Telephone Encounter (Signed)
Pt re questing refill for Diroximel Fumarate (VUMERITY) 231 MG CPDR. Pharmacy [place], [place] [address] Store [phone].

## 2021-10-11 NOTE — Telephone Encounter (Signed)
Refill sent as requested. 

## 2021-10-24 ENCOUNTER — Other Ambulatory Visit: Payer: Self-pay | Admitting: Neurology

## 2021-10-24 MED ORDER — AMPHETAMINE-DEXTROAMPHETAMINE 20 MG PO TABS: ORAL_TABLET | ORAL | 0 refills | Status: DC

## 2021-10-24 NOTE — Telephone Encounter (Signed)
Pt is requesting a refill for amphetamine-dextroamphetamine (ADDERALL) 20 MG tablet.  [id number]  Pt confirmed no changes to insurance with New Year

## 2021-11-07 ENCOUNTER — Telehealth: Payer: Self-pay | Admitting: Neurology

## 2021-11-07 DIAGNOSIS — G35 Multiple sclerosis: Secondary | ICD-10-CM

## 2021-11-07 MED ORDER — VUMERITY 231 MG PO CPDR: 2.0000 | DELAYED_RELEASE_CAPSULE | Freq: Two times a day (BID) | ORAL | 1 refills | Status: DC

## 2021-11-07 NOTE — Telephone Encounter (Signed)
Refill for the mediction has been ordered and [facility]

## 2021-11-07 NOTE — Telephone Encounter (Signed)
Pt called stating that he is needing his Diroximel Fumarate (VUMERITY) 231 MG CPDR sent over to the [facility] from now on.

## 2021-11-09 ENCOUNTER — Telehealth: Payer: Self-pay | Admitting: Neurology

## 2021-11-09 NOTE — Telephone Encounter (Signed)
Called and spoke with pt. Made him aware rx called in on 11/07/21 [facility]. He verbalized understanding and will f/u with pharmacy to process refill.

## 2021-11-09 NOTE — Telephone Encounter (Signed)
Pt is needing a Rx for his Diroximel Fumarate (VUMERITY) 231 MG CPDR [facility]

## 2021-12-15 ENCOUNTER — Ambulatory Visit (INDEPENDENT_AMBULATORY_CARE_PROVIDER_SITE_OTHER): Payer: Medicare Other | Admitting: Neurology

## 2021-12-15 ENCOUNTER — Encounter: Payer: Self-pay | Admitting: Neurology

## 2021-12-15 VITALS — BP 147/89 | HR 108 | Ht 73.0 in

## 2021-12-15 DIAGNOSIS — Z79899 Other long term (current) drug therapy: Secondary | ICD-10-CM | POA: Diagnosis not present

## 2021-12-15 DIAGNOSIS — R269 Unspecified abnormalities of gait and mobility: Secondary | ICD-10-CM

## 2021-12-15 DIAGNOSIS — N319 Neuromuscular dysfunction of bladder, unspecified: Secondary | ICD-10-CM | POA: Diagnosis not present

## 2021-12-15 DIAGNOSIS — F09 Unspecified mental disorder due to known physiological condition: Secondary | ICD-10-CM

## 2021-12-15 DIAGNOSIS — G35 Multiple sclerosis: Secondary | ICD-10-CM

## 2021-12-15 MED ORDER — AMPHETAMINE-DEXTROAMPHETAMINE 20 MG PO TABS: ORAL_TABLET | ORAL | 0 refills | Status: DC

## 2021-12-15 MED ORDER — VUMERITY 231 MG PO CPDR: 2.0000 | DELAYED_RELEASE_CAPSULE | Freq: Two times a day (BID) | ORAL | 11 refills | Status: DC

## 2021-12-15 NOTE — Progress Notes (Signed)
[facility]  PATIENT: Gabriel Hanson DOB: 07-Jul-1951  REFERRING DOCTOR OR PCP:  None  SOURCE: patient  _________________________________   HISTORICAL  CHIEF COMPLAINT:  Chief Complaint  Patient presents with   Follow-up    Rm 1, w brother in law Gabriel Hanson. Here for 6 month MS f/u, on Vumerity and tolerating well. Pt reports no improvement on Flomax. Still having issues emptying his bladder. Pts brother wold like to discuss long term care, pt currently living alone.     HISTORY OF PRESENT ILLNESS:  Gabriel Hanson is a 71 y.o. man with relapsing remitting MS.       Update 12/15/2021 He is on Vumerity.   He now tolerates it well.    Although he has no definite exacerbation he is having more issues.  Gait, baldder and cognition are worse.  We had a discussion about disease modifying therapies and have been generally much better for the inflammatory aspects of MS (new lesions on MRI and relapses) compared to degenerative/progressive aspects.  His gait is off balanced, worsening steadily over the past few years.    He walks slowly.   He often needs to grab the couch or chairs and has fallen x 3 in past year in his home.    He hit his face with one fall.  Balance is much more a problem than strength.   Leg strength is fine.  He notes his feet feel cold, initially only in his toes an now in his feet.      He has urinary hesitancy and feels he does not empty.     The tamsulosin helps the hesitancy but not the urgency.  Several years ago he saw urology.  He has fatigue and cognitive fog.  He will take Adderall (one half of a 20 mg tablet) in the morning and sometimes take a second dose later in the day.  If he takes the second dose to date he has trouble falling asleep.  He falls asleep fairly well most nights but will wake up a few times, sometimes because of his bladder.  Mood is doing well.      MS History:  He was diagnosed with MS in 2001 when he presented with right visual  loss.     He had an MRI worrisome for MS.   He was placed on IV steroids and vision got 80% better.    He was placed on an interferon first and then Copaxone because he had tolerability issues.     For two years, he was without insurance and stopped all DMT.   I started seeing him in 2015 and we started Tecfidera.   He tolerates it well but sometimes forgets the second pill because all his medications are qAM.  MRI brain 2011 shows white matter foci in the periventricular and juxtacortical white matter consistent with the clinical diagnosis of MS. The right optic nerve was reduced in size. There w was one focus in the right pons and one in the left cerebellar hemisphere, as well. None of the foci were enhancing during that MRI.   MRI 04/01/2019 brain showed supratentorial and infratentorial lesions unchanged. Compared to 02/22/2016  REVIEW OF SYSTEMS: Constitutional: No fevers, chills, sweats, or change in appetite.  He notes some fatigue. Occasional insomnia. Eyes: No visual changes, double vision, eye pain Ear, nose and throat: No hearing loss, ear pain, nasal congestion, sore throat Cardiovascular: No chest pain, palpitations Respiratory:  No shortness of breath at rest  or with exertion.   No wheezes GastrointestinaI: No nausea, vomiting, diarrhea, abdominal pain, fecal incontinence.  Severe constipation Genitourinary:  as above.  Musculoskeletal:  No neck pain, back pain Integumentary: No rash, pruritus, skin lesions Neurological: as above Psychiatric: Mild depression. Cognitive decline. Endocrine: No palpitations, diaphoresis, change in appetite, change in weigh or increased thirst Hematologic/Lymphatic:  No anemia, purpura, petechiae. Allergic/Immunologic: No itchy/runny eyes, nasal congestion, recent allergic reactions, rashes  ALLERGIES: No Known Allergies  HOME MEDICATIONS:  Current Outpatient Medications:    naproxen sodium (ALEVE) 220 MG tablet, Take 660 mg by mouth daily., Disp: ,  Rfl:    tamsulosin (FLOMAX) 0.4 MG CAPS capsule, Take 2 capsules (0.8 mg total) by mouth daily., Disp: 180 capsule, Rfl: 3   amphetamine-dextroamphetamine (ADDERALL) 20 MG tablet, Take one or two pills a day as needed., Disp: 60 tablet, Rfl: 0   Diroximel Fumarate (VUMERITY) 231 MG CPDR, Take 2 capsules by mouth 2 (two) times daily., Disp: 120 capsule, Rfl: 11  PAST MEDICAL HISTORY: Past Medical History:  Diagnosis Date   Depressive disorder, not elsewhere classified    Disorder of eye, unspecified    Memory loss    Multiple sclerosis (HCC)    Other persistent mental disorders due to conditions classified elsewhere    Other psoriasis    Psychosexual dysfunction, unspecified     PAST SURGICAL HISTORY: Past Surgical History:  Procedure Laterality Date   KNEE ARTHROSCOPY     right    TONSILLECTOMY      FAMILY HISTORY: Family History  Problem Relation Age of Onset   Breast cancer Mother    COPD Father    Heart disease Unknown     SOCIAL HISTORY:  Social History   Socioeconomic History   Marital status: Single    Spouse name: Not on file   Number of children: 0   Years of education: Not on file   Highest education level: Bachelor's degree (e.g., BA, AB, BS)  Occupational History   Occupation: Energy manager: [employer]    Comment: Fulltime  Tobacco Use   Smoking status: Heavy Smoker    Packs/day: 1.00    Years: 44.00    Pack years: 44.00    Types: Cigarettes   Smokeless tobacco: Never  Vaping Use   Vaping Use: Every day  Substance and Sexual Activity   Alcohol use: Yes    Alcohol/week: 0.0 standard drinks    Comment: Occasionally/fim   Drug use: Yes    Types: Marijuana   Sexual activity: Not on file  Other Topics Concern   Not on file  Social History Narrative   Not on file   Social Determinants of Health   Financial Resource Strain: Not on file  Food Insecurity: Not on file  Transportation Needs: Not on file  Physical Activity: Not  on file  Stress: Not on file  Social Connections: Not on file  Intimate Partner Violence: Not on file     PHYSICAL EXAM  Vitals:   12/15/21 1413  BP: (!) 147/89  Pulse: (!) 108  Height:  (1.854 m)    Body mass index is 23.09 kg/m.   General: The patient is well-developed and well-nourished and in no acute distress  Neurologic Exam  Mental status: The patient is alert and oriented x 3 at the time of the examination.   Ok memory but some distractibility.   Speech is normal.  Cranial nerves: Extraocular movements show a left INO. He  has a 1+ APD on the right.   Vision is blurry and colors desaturated on the right.   Facial strength and sensation is normal. Trapezius strength is normal. No obvious hearing deficits are noted.  Motor:  Muscle bulk is normal.   Tone is slightly increased n legs.  . Strength is  5 / 5 in all 4 extremities   However, reduced RAM in left arm and foot    Sensory: Sensation is normal in his arms but reduced in the left foot to touch  Coordination: Cerebellar testing shows good bilateral finger-nose-finger slightly but reduced bilateral heel-to-shin  Gait and station: Station is normal.   His gait is wide and spastic.  He has a left foot drop.  He is unable to do a tandem gait.  Romberg is negative.  Reflexes: Deep tendon reflexes are symmetric and mildly increased in the Legs.     DIAGNOSTIC DATA (LABS, IMAGING, TESTING) - I reviewed patient records, labs, notes, testing and imaging myself where available.  Lab Results  Component Value Date   WBC 6.6 06/14/2021   HGB 15.2 06/14/2021   HCT 45.1 06/14/2021   MCV 92 06/14/2021   PLT 248 06/14/2021      Component Value Date/Time   NA 142 09/28/2017 1000   K 4.4 09/28/2017 1000   CL 104 09/28/2017 1000   CO2 23 09/28/2017 1000   GLUCOSE 93 09/28/2017 1000   GLUCOSE 115 (H) 09/02/2012 1539   BUN 13 09/28/2017 1000   CREATININE 0.90 09/28/2017 1000   CALCIUM 9.4 09/28/2017 1000   PROT  6.7 06/14/2021 1141   ALBUMIN 4.7 06/14/2021 1141   AST 12 06/14/2021 1141   ALT 8 06/14/2021 1141   ALKPHOS 71 06/14/2021 1141   BILITOT 1.4 (H) 06/14/2021 1141   GFRNONAA 89 09/28/2017 1000   GFRAA 103 09/28/2017 1000       ASSESSMENT AND PLAN  High risk medication use - Plan: Hepatic function panel  Multiple sclerosis (HCC) - Plan: Diroximel Fumarate (VUMERITY) 231 MG CPDR, CBC with Differential/Platelet, Hepatic function panel  Bladder dysfunction  Cognitive dysfunction  Gait disturbance   1.   Continue Vumerity.   Check labs later in the year we will consider repeat MRI.    2.   Adderall 10 mg once or twice a day 3.   Tamsulosin is not working anymore we can refer to urilgy 4.    rtc 6 months.   Nicolemarie Wooley A. Epimenio Foot, MD, PhD 12/15/2021, 3:41 PM Certified in Neurology, Clinical Neurophysiology, Sleep Medicine, Pain Medicine and Neuroimaging  [facility] [address], [address] [place], [place] [address] [phone]

## 2021-12-16 LAB — HEPATIC FUNCTION PANEL
ALT: 11 IU/L (ref 0–44)
AST: 13 IU/L (ref 0–40)
Albumin: 4.5 g/dL (ref 3.8–4.8)
Alkaline Phosphatase: 83 IU/L (ref 44–121)
Bilirubin Total: 0.6 mg/dL (ref 0.0–1.2)
Bilirubin, Direct: 0.16 mg/dL (ref 0.00–0.40)
Total Protein: 6.5 g/dL (ref 6.0–8.5)

## 2021-12-16 LAB — CBC WITH DIFFERENTIAL/PLATELET
Basophils Absolute: 0.1 10*3/uL (ref 0.0–0.2)
Basos: 1 %
EOS (ABSOLUTE): 0.1 10*3/uL (ref 0.0–0.4)
Eos: 2 %
Hematocrit: 43.7 % (ref 37.5–51.0)
Hemoglobin: 15.3 g/dL (ref 13.0–17.7)
Immature Grans (Abs): 0 10*3/uL (ref 0.0–0.1)
Immature Granulocytes: 0 %
Lymphocytes Absolute: 0.9 10*3/uL (ref 0.7–3.1)
Lymphs: 14 %
MCH: 31.7 pg (ref 26.6–33.0)
MCHC: 35 g/dL (ref 31.5–35.7)
MCV: 91 fL (ref 79–97)
Monocytes Absolute: 0.6 10*3/uL (ref 0.1–0.9)
Monocytes: 9 %
Neutrophils Absolute: 4.8 10*3/uL (ref 1.4–7.0)
Neutrophils: 74 %
Platelets: 263 10*3/uL (ref 150–450)
RBC: 4.82 x10E6/uL (ref 4.14–5.80)
RDW: 12.8 % (ref 11.6–15.4)
WBC: 6.5 10*3/uL (ref 3.4–10.8)

## 2021-12-19 ENCOUNTER — Telehealth: Payer: Self-pay | Admitting: Neurology

## 2021-12-19 NOTE — Telephone Encounter (Signed)
This refill has already been sent to the [facility] 12/15/2021 by Dr Epimenio Foot.  The pharmacy should already have this on file for the pt.  ?Called the pharmacy and they did receive the script.  ?Called the pt to make him aware of the script being sent in. Advised he would have to contact pharmacy to schedule shipment. Pt didn't have their number, I was able to provide him that information. Pt verbalized understanding. ?Pt had no questions at this time but was encouraged to call back if questions arise. ? ?

## 2021-12-19 NOTE — Telephone Encounter (Signed)
Pt request refill for Diroximel Fumarate (VUMERITY) 231 MG CPDR at [facility] [place]  ?

## 2022-01-12 ENCOUNTER — Telehealth: Payer: Self-pay | Admitting: Neurology

## 2022-01-12 ENCOUNTER — Other Ambulatory Visit: Payer: Self-pay | Admitting: Neurology

## 2022-01-12 MED ORDER — PREDNISONE 50 MG PO TABS: 600.0000 mg | ORAL_TABLET | Freq: Every day | ORAL | 0 refills | Status: AC

## 2022-01-12 NOTE — Telephone Encounter (Signed)
Called the pt back to get more information. He states that he used to feel like MS was a disadvantage but here recently over the last two weeks he describes a tremendous decline in health. He states balance is off, body feels strange. Brain fog and memory is worse and having difficulty with keeping thoughts together. He described numbness/tingling in toes up to ankles.  ? ?Pt describes he has never felt this way or been this bad. He normally could go and get on the bus no problem but he doesn't think he would be able to do this.  ?Denies signs of infection and states no fever.  ?Advised the pt I will discuss these symptoms with Dr Epimenio Foot and see if he recommends anything in the meantime. I was able to get the pt worked in Monday at 1 pm with check in of 12:30. Pt is hoping that he can get a ride. Advised the pt to call back if unable to do that. Pt verbalized understanding. ?Will call the pt back if Dr Epimenio Foot recommends anything in the meantime? ?

## 2022-01-12 NOTE — Telephone Encounter (Signed)
Pt said two weeks ago MS has gotten worse, cannot walk, having trouble thinking. Was feeling fine until received my new supply for  Diroximel Fumarate (VUMERITY) 231 MG CPDR  Would like a call from the nurse. ?

## 2022-01-12 NOTE — Telephone Encounter (Signed)
Called the pt and advised I spoke with Dr Epimenio Foot and he recommends while waiting for the appt we can try high dose steroids orally to see if this helps with relieving these symptoms. [facility] on [address] as requested by the pt. He states he will have someone pick this up for him. Pt was appreciative for the response.  ? ?**If pharmacy calls in concerned of the high dose that is ordered the dose is correct. 600 mg daily for 3 days for MS exacerbation.  ?

## 2022-01-16 ENCOUNTER — Encounter: Payer: Self-pay | Admitting: Neurology

## 2022-01-16 ENCOUNTER — Ambulatory Visit (INDEPENDENT_AMBULATORY_CARE_PROVIDER_SITE_OTHER): Payer: Medicare Other | Admitting: Neurology

## 2022-01-16 VITALS — BP 149/93 | HR 99 | Ht 73.0 in | Wt 181.0 lb

## 2022-01-16 DIAGNOSIS — G35 Multiple sclerosis: Secondary | ICD-10-CM | POA: Diagnosis not present

## 2022-01-16 DIAGNOSIS — R269 Unspecified abnormalities of gait and mobility: Secondary | ICD-10-CM | POA: Diagnosis not present

## 2022-01-16 DIAGNOSIS — Z79899 Other long term (current) drug therapy: Secondary | ICD-10-CM

## 2022-01-16 DIAGNOSIS — F09 Unspecified mental disorder due to known physiological condition: Secondary | ICD-10-CM | POA: Diagnosis not present

## 2022-01-16 DIAGNOSIS — R3911 Hesitancy of micturition: Secondary | ICD-10-CM

## 2022-01-16 MED ORDER — TAMSULOSIN HCL 0.4 MG PO CAPS: 0.8000 mg | ORAL_CAPSULE | Freq: Every day | ORAL | 3 refills | Status: DC

## 2022-01-16 NOTE — Progress Notes (Signed)
? ?[facility] ? ?PATIENT: Gabriel Hanson ?DOB: 06/22/1951 ? ?REFERRING DOCTOR OR PCP:  None  ?SOURCE: patient ? ?_________________________________ ? ? ?HISTORICAL ? ?CHIEF COMPLAINT:  ?Chief Complaint  ?Patient presents with  ? Follow-up  ?  RM 2. Last seen 12/15/21. Here to f/u for MS exacerbations sx and concerns. Pt reports short term memory is worsening. MOCA:27  ? ? ?HISTORY OF PRESENT ILLNESS:  ?Gabriel Hanson is a 71 y.o. man with relapsing remitting MS.      ? ?Update 01/17/2019. ?He is on Vumerity.   He tolerates it well now.    Lymphocytes were 0.9 when checked last month.    ? ?Due to balance, gait and memory doing much worse a couple weeks ago, we did 3 days of high dose pills and he feels better ? ?He is noting a lot more difficulty in short term memory this year.   He actually notes that with hints, memory is better.  Today, he scored 27/30 on the [facility] cognitive assessment.  He notes he now needs to take an entire Adderall pill when he used to do ok on half a pill.    He is now taking Adderall 20 mg daily and notes it helps until 5 pm.  He feels he does not do enough more difficult cognitive tasks in the evenings to need more time effect.    ? ?He is having some difficulty with some task in his home and is concerned he may not be ale to continue living alone.   He has only one family member nearby (ex brother in law) who helps him some.   ? ?Additionally, gait and bladder are also worse.   Although, his gait is off balanced, worsening steadily over the past few years, he feels his worsened a couple weeks ago.    He walks slowly.   He often needs to grab the couch or chairs and has fallen x 3 in past year in his home.    He hit his face with one fall.  Balance is much more a problem than strength.   Leg strength is fine.  He notes his feet feel cold, initially only in his toes an now in his feet.     ? ?He has urinary hesitancy and feels he does not empty.     The tamsulosin helps  the hesitancy but not the urgency.  He ran out of tamsulosin and has been doing worse. ? ?Besides the cognitive issues he also notes fatigue.Marland Kitchen  He has done better taking Adderall 20 mg in the morning rather than 10 mg twice a day.Marland Kitchen  He falls asleep fairly well most nights but will wake up a few times, sometimes because of his bladder.  He denies depression but notes he is apathetic.     He rarely smokes pot (none in last 6 weeks).     ? ? ?  01/16/2022  ? 11:57 AM  ?[facility] Cognitive Assessment   ?Visuospatial/ Executive (0/5) 5  ?Naming (0/3) 3  ?Attention: Read list of digits (0/2) 2  ?Attention: Read list of letters (0/1) 1  ?Attention: Serial 7 subtraction starting at 100 (0/3) 3  ?Language: Repeat phrase (0/2) 1  ?Language : Fluency (0/1) 1  ?Abstraction (0/2) 2  ?Delayed Recall (0/5) 3  ?Orientation (0/6) 6  ?Total 27  ? ? ?MS History:  He was diagnosed with MS in 2001 when he presented with right visual loss.     He had  an MRI worrisome for MS.   He was placed on IV steroids and vision got 80% better.    He was placed on an interferon first and then Copaxone because he had tolerability issues.     For two years, he was without insurance and stopped all DMT.   I started seeing him in 2015 and we started Tecfidera.   He tolerates it well but sometimes forgets the second pill because all his medications are qAM.  MRI brain 2011 shows white matter foci in the periventricular and juxtacortical white matter consistent with the clinical diagnosis of MS. The right optic nerve was reduced in size. There w was one focus in the right pons and one in the left cerebellar hemisphere, as well. None of the foci were enhancing during that MRI.  ? ?MRI 04/01/2019 brain showed supratentorial and infratentorial lesions unchanged. Compared to 02/22/2016 ? ?REVIEW OF SYSTEMS: ?Constitutional: No fevers, chills, sweats, or change in appetite.  He notes some fatigue. Occasional insomnia. ?Eyes: No visual changes, double vision, eye  pain ?Ear, nose and throat: No hearing loss, ear pain, nasal congestion, sore throat ?Cardiovascular: No chest pain, palpitations ?Respiratory:  No shortness of breath at rest or with exertion.   No wheezes ?GastrointestinaI: No nausea, vomiting, diarrhea, abdominal pain, fecal incontinence.  Severe constipation ?Genitourinary:  as above.  ?Musculoskeletal:  No neck pain, back pain ?Integumentary: No rash, pruritus, skin lesions ?Neurological: as above ?Psychiatric: Mild depression. Cognitive decline. ?Endocrine: No palpitations, diaphoresis, change in appetite, change in weigh or increased thirst ?Hematologic/Lymphatic:  No anemia, purpura, petechiae. ?Allergic/Immunologic: No itchy/runny eyes, nasal congestion, recent allergic reactions, rashes ? ?ALLERGIES: ?No Known Allergies ? ?HOME MEDICATIONS: ? ?Current Outpatient Medications:  ?  amphetamine-dextroamphetamine (ADDERALL) 20 MG tablet, Take one or two pills a day as needed., Disp: 60 tablet, Rfl: 0 ?  Diroximel Fumarate (VUMERITY) 231 MG CPDR, Take 2 capsules by mouth 2 (two) times daily., Disp: 120 capsule, Rfl: 11 ?  naproxen sodium (ALEVE) 220 MG tablet, Take 660 mg by mouth daily., Disp: , Rfl:  ?  tamsulosin (FLOMAX) 0.4 MG CAPS capsule, Take 2 capsules (0.8 mg total) by mouth daily., Disp: 180 capsule, Rfl: 3 ? ?PAST MEDICAL HISTORY: ?Past Medical History:  ?Diagnosis Date  ? Depressive disorder, not elsewhere classified   ? Disorder of eye, unspecified   ? Memory loss   ? Multiple sclerosis (HCC)   ? Other persistent mental disorders due to conditions classified elsewhere   ? Other psoriasis   ? Psychosexual dysfunction, unspecified   ? ? ?PAST SURGICAL HISTORY: ?Past Surgical History:  ?Procedure Laterality Date  ? KNEE ARTHROSCOPY    ? right   ? TONSILLECTOMY    ? ? ?FAMILY HISTORY: ?Family History  ?Problem Relation Age of Onset  ? Breast cancer Mother   ? COPD Father   ? Heart disease Unknown   ? ? ?SOCIAL HISTORY: ? ?Social History   ? ?Socioeconomic History  ? Marital status: Single  ?  Spouse name: Not on file  ? Number of children: 0  ? Years of education: Not on file  ? Highest education level: Bachelor's degree (e.g., BA, AB, BS)  ?Occupational History  ? Occupation: Micah Flesher  ?  Employer: [employer]  ?  Comment: Fulltime  ?Tobacco Use  ? Smoking status: Heavy Smoker  ?  Packs/day: 1.00  ?  Years: 44.00  ?  Pack years: 44.00  ?  Types: Cigarettes  ? Smokeless tobacco: Never  ?  Vaping Use  ? Vaping Use: Every day  ?Substance and Sexual Activity  ? Alcohol use: Yes  ?  Alcohol/week: 0.0 standard drinks  ?  Comment: Occasionally/fim  ? Drug use: Yes  ?  Types: Marijuana  ? Sexual activity: Not on file  ?Other Topics Concern  ? Not on file  ?Social History Narrative  ? Not on file  ? ?Social Determinants of Health  ? ?Financial Resource Strain: Not on file  ?Food Insecurity: Not on file  ?Transportation Needs: Not on file  ?Physical Activity: Not on file  ?Stress: Not on file  ?Social Connections: Not on file  ?Intimate Partner Violence: Not on file  ? ? ? ?PHYSICAL EXAM ? ?Vitals:  ? 01/16/22 1137  ?BP: (!) 149/93  ?Pulse: 99  ?Weight: 181 lb (82.1 kg)  ?Height: 6\' 1"  (1.854 m)  ? ? ?Body mass index is 23.88 kg/m?. ? ? ?General: The patient is well-developed and well-nourished and in no acute distress ? ?Neurologic Exam ? ?Mental status: The patient is alert and oriented x 3 at the time of the examination.   Ok memory but some distractibility.   He scored 27/30 on the [facility] cognitive assessment (details above).  Speech is normal. ? ?Cranial nerves: Extraocular movements show a left INO. He has a 1+ APD on the right.   Vision is blurry and colors desaturated on the right.   Facial strength and sensation is normal. Trapezius strength is normal. No obvious hearing deficits are noted. ? ?Motor:  Muscle bulk is normal.   Tone is slightly increased n legs.  . Strength is  5 / 5 in all 4 extremities   However, reduced RAM in left arm and  foot   ? ?Sensory: Sensation is normal in his arms but reduced in the left foot to touch ? ?Coordination: Cerebellar testing shows good bilateral finger-nose-finger slightly but reduced bilateral heel-to-shin ? ?[facility]

## 2022-01-17 LAB — URINALYSIS, ROUTINE W REFLEX MICROSCOPIC
Bilirubin, UA: NEGATIVE
Glucose, UA: NEGATIVE
Ketones, UA: NEGATIVE
Leukocytes,UA: NEGATIVE
Nitrite, UA: NEGATIVE
RBC, UA: NEGATIVE
Specific Gravity, UA: 1.024 (ref 1.005–1.030)
Urobilinogen, Ur: 1 mg/dL (ref 0.2–1.0)
pH, UA: 6 (ref 5.0–7.5)

## 2022-01-18 ENCOUNTER — Emergency Department (HOSPITAL_COMMUNITY): Payer: Medicare Other

## 2022-01-18 ENCOUNTER — Inpatient Hospital Stay (HOSPITAL_COMMUNITY)
Admission: EM | Admit: 2022-01-18 | Discharge: 2022-01-23 | DRG: 871 | Disposition: A | Discharge status: Skilled Nursing Facility | Payer: Medicare Other | Attending: Internal Medicine | Admitting: Internal Medicine

## 2022-01-18 ENCOUNTER — Other Ambulatory Visit: Payer: Self-pay

## 2022-01-18 DIAGNOSIS — R6520 Severe sepsis without septic shock: Secondary | ICD-10-CM | POA: Diagnosis present

## 2022-01-18 DIAGNOSIS — R918 Other nonspecific abnormal finding of lung field: Secondary | ICD-10-CM | POA: Diagnosis not present

## 2022-01-18 DIAGNOSIS — E440 Moderate protein-calorie malnutrition: Secondary | ICD-10-CM | POA: Diagnosis present

## 2022-01-18 DIAGNOSIS — D849 Immunodeficiency, unspecified: Secondary | ICD-10-CM | POA: Diagnosis present

## 2022-01-18 DIAGNOSIS — Z20822 Contact with and (suspected) exposure to covid-19: Secondary | ICD-10-CM | POA: Diagnosis present

## 2022-01-18 DIAGNOSIS — R739 Hyperglycemia, unspecified: Secondary | ICD-10-CM

## 2022-01-18 DIAGNOSIS — G35 Multiple sclerosis: Secondary | ICD-10-CM | POA: Diagnosis not present

## 2022-01-18 DIAGNOSIS — R509 Fever, unspecified: Secondary | ICD-10-CM | POA: Diagnosis not present

## 2022-01-18 DIAGNOSIS — G9340 Encephalopathy, unspecified: Secondary | ICD-10-CM | POA: Diagnosis not present

## 2022-01-18 DIAGNOSIS — N390 Urinary tract infection, site not specified: Secondary | ICD-10-CM | POA: Diagnosis present

## 2022-01-18 DIAGNOSIS — E871 Hypo-osmolality and hyponatremia: Secondary | ICD-10-CM | POA: Diagnosis present

## 2022-01-18 DIAGNOSIS — N400 Enlarged prostate without lower urinary tract symptoms: Secondary | ICD-10-CM | POA: Diagnosis present

## 2022-01-18 DIAGNOSIS — D72829 Elevated white blood cell count, unspecified: Secondary | ICD-10-CM

## 2022-01-18 DIAGNOSIS — K5900 Constipation, unspecified: Secondary | ICD-10-CM | POA: Diagnosis not present

## 2022-01-18 DIAGNOSIS — I700 Atherosclerosis of aorta: Secondary | ICD-10-CM | POA: Diagnosis not present

## 2022-01-18 DIAGNOSIS — F32A Depression, unspecified: Secondary | ICD-10-CM | POA: Diagnosis present

## 2022-01-18 DIAGNOSIS — Z6823 Body mass index (BMI) 23.0-23.9, adult: Secondary | ICD-10-CM

## 2022-01-18 DIAGNOSIS — F09 Unspecified mental disorder due to known physiological condition: Secondary | ICD-10-CM | POA: Diagnosis not present

## 2022-01-18 DIAGNOSIS — A419 Sepsis, unspecified organism: Secondary | ICD-10-CM | POA: Diagnosis not present

## 2022-01-18 DIAGNOSIS — E876 Hypokalemia: Secondary | ICD-10-CM | POA: Diagnosis not present

## 2022-01-18 DIAGNOSIS — Z79899 Other long term (current) drug therapy: Secondary | ICD-10-CM

## 2022-01-18 DIAGNOSIS — R269 Unspecified abnormalities of gait and mobility: Secondary | ICD-10-CM | POA: Diagnosis not present

## 2022-01-18 DIAGNOSIS — N281 Cyst of kidney, acquired: Secondary | ICD-10-CM | POA: Diagnosis not present

## 2022-01-18 DIAGNOSIS — J9811 Atelectasis: Secondary | ICD-10-CM | POA: Diagnosis not present

## 2022-01-18 DIAGNOSIS — E878 Other disorders of electrolyte and fluid balance, not elsewhere classified: Secondary | ICD-10-CM | POA: Diagnosis present

## 2022-01-18 DIAGNOSIS — J90 Pleural effusion, not elsewhere classified: Secondary | ICD-10-CM | POA: Diagnosis not present

## 2022-01-18 DIAGNOSIS — L408 Other psoriasis: Secondary | ICD-10-CM | POA: Diagnosis present

## 2022-01-18 DIAGNOSIS — R400 Somnolence: Secondary | ICD-10-CM | POA: Diagnosis not present

## 2022-01-18 DIAGNOSIS — E860 Dehydration: Secondary | ICD-10-CM | POA: Diagnosis not present

## 2022-01-18 DIAGNOSIS — F17210 Nicotine dependence, cigarettes, uncomplicated: Secondary | ICD-10-CM | POA: Diagnosis not present

## 2022-01-18 DIAGNOSIS — J189 Pneumonia, unspecified organism: Secondary | ICD-10-CM | POA: Diagnosis not present

## 2022-01-18 DIAGNOSIS — G9341 Metabolic encephalopathy: Secondary | ICD-10-CM | POA: Diagnosis present

## 2022-01-18 DIAGNOSIS — E46 Unspecified protein-calorie malnutrition: Secondary | ICD-10-CM | POA: Diagnosis not present

## 2022-01-18 DIAGNOSIS — R410 Disorientation, unspecified: Secondary | ICD-10-CM | POA: Diagnosis not present

## 2022-01-18 DIAGNOSIS — J439 Emphysema, unspecified: Secondary | ICD-10-CM | POA: Diagnosis not present

## 2022-01-18 DIAGNOSIS — R4182 Altered mental status, unspecified: Secondary | ICD-10-CM

## 2022-01-18 DIAGNOSIS — E8809 Other disorders of plasma-protein metabolism, not elsewhere classified: Secondary | ICD-10-CM

## 2022-01-18 DIAGNOSIS — R000 Tachycardia, unspecified: Secondary | ICD-10-CM | POA: Diagnosis not present

## 2022-01-18 DIAGNOSIS — A481 Legionnaires' disease: Secondary | ICD-10-CM | POA: Diagnosis present

## 2022-01-18 DIAGNOSIS — R3911 Hesitancy of micturition: Secondary | ICD-10-CM | POA: Diagnosis not present

## 2022-01-18 DIAGNOSIS — R41840 Attention and concentration deficit: Secondary | ICD-10-CM | POA: Diagnosis not present

## 2022-01-18 DIAGNOSIS — H579 Unspecified disorder of eye and adnexa: Secondary | ICD-10-CM | POA: Diagnosis not present

## 2022-01-18 LAB — COMPREHENSIVE METABOLIC PANEL
ALT: 18 U/L (ref 0–44)
AST: 21 U/L (ref 15–41)
Albumin: 3 g/dL — ABNORMAL LOW (ref 3.5–5.0)
Alkaline Phosphatase: 57 U/L (ref 38–126)
Anion gap: 10 (ref 5–15)
BUN: 22 mg/dL (ref 8–23)
CO2: 23 mmol/L (ref 22–32)
Calcium: 8.7 mg/dL — ABNORMAL LOW (ref 8.9–10.3)
Chloride: 102 mmol/L (ref 98–111)
Creatinine, Ser: 0.89 mg/dL (ref 0.61–1.24)
GFR, Estimated: >60 mL/min (ref >60)
Glucose, Bld: 146 mg/dL — ABNORMAL HIGH (ref 70–99)
Potassium: 3.8 mmol/L (ref 3.5–5.1)
Sodium: 135 mmol/L (ref 135–145)
Total Bilirubin: 1.3 mg/dL — ABNORMAL HIGH (ref 0.3–1.2)
Total Protein: 6.2 g/dL — ABNORMAL LOW (ref 6.5–8.1)

## 2022-01-18 LAB — CBC WITH DIFFERENTIAL/PLATELET
Abs Immature Granulocytes: 0.11 10*3/uL — ABNORMAL HIGH (ref 0.00–0.07)
Basophils Absolute: 0 10*3/uL (ref 0.0–0.1)
Basophils Relative: 0 %
Eosinophils Absolute: 0 10*3/uL (ref 0.0–0.5)
Eosinophils Relative: 0 %
HCT: 45.8 % (ref 39.0–52.0)
Hemoglobin: 15.4 g/dL (ref 13.0–17.0)
Immature Granulocytes: 1 %
Lymphocytes Relative: 2 %
Lymphs Abs: 0.3 10*3/uL — ABNORMAL LOW (ref 0.7–4.0)
MCH: 31.7 pg (ref 26.0–34.0)
MCHC: 33.6 g/dL (ref 30.0–36.0)
MCV: 94.2 fL (ref 80.0–100.0)
Monocytes Absolute: 0.6 10*3/uL (ref 0.1–1.0)
Monocytes Relative: 4 %
Neutro Abs: 14.8 10*3/uL — ABNORMAL HIGH (ref 1.7–7.7)
Neutrophils Relative %: 93 %
Platelets: 207 10*3/uL (ref 150–400)
RBC: 4.86 MIL/uL (ref 4.22–5.81)
RDW: 13 % (ref 11.5–15.5)
WBC: 15.9 10*3/uL — ABNORMAL HIGH (ref 4.0–10.5)
nRBC: 0 % (ref 0.0–0.2)

## 2022-01-18 LAB — URINALYSIS, ROUTINE W REFLEX MICROSCOPIC
Bilirubin Urine: NEGATIVE
Glucose, UA: NEGATIVE mg/dL
Ketones, ur: 80 mg/dL — AB
Leukocytes,Ua: NEGATIVE
Nitrite: NEGATIVE
Protein, ur: 100 mg/dL — AB
Specific Gravity, Urine: 1.024 (ref 1.005–1.030)
pH: 6 (ref 5.0–8.0)

## 2022-01-18 LAB — APTT: aPTT: 35 seconds (ref 24–36)

## 2022-01-18 LAB — RESP PANEL BY RT-PCR (FLU A&B, COVID) ARPGX2
Influenza A by PCR: NEGATIVE
Influenza B by PCR: NEGATIVE
SARS Coronavirus 2 by RT PCR: NEGATIVE

## 2022-01-18 LAB — AMMONIA: Ammonia: 21 umol/L (ref 9–35)

## 2022-01-18 LAB — CK: Total CK: 58 U/L (ref 49–397)

## 2022-01-18 LAB — PROTIME-INR
INR: 1.1 (ref 0.8–1.2)
Prothrombin Time: 14.2 seconds (ref 11.4–15.2)

## 2022-01-18 LAB — LACTIC ACID, PLASMA: Lactic Acid, Venous: 1.8 mmol/L (ref 0.5–1.9)

## 2022-01-18 LAB — URINE CULTURE: Organism ID, Bacteria: NO GROWTH

## 2022-01-18 MED ORDER — ACETAMINOPHEN 325 MG PO TABS
650.0000 mg | ORAL_TABLET | Freq: Once | ORAL | Status: AC
Start: 2022-01-18 — End: 2022-01-18
  Administered 2022-01-18: 650 mg via ORAL
  Filled 2022-01-18: qty 2

## 2022-01-18 MED ORDER — SODIUM CHLORIDE 0.9 % IV SOLN
2.0000 g | Freq: Once | INTRAVENOUS | Status: AC
  Administered 2022-01-18: 2 g via INTRAVENOUS
  Filled 2022-01-18: qty 12.5

## 2022-01-18 MED ORDER — METRONIDAZOLE 500 MG/100ML IV SOLN
500.0000 mg | Freq: Once | INTRAVENOUS | Status: AC
  Administered 2022-01-18: 500 mg via INTRAVENOUS
  Filled 2022-01-18: qty 100

## 2022-01-18 MED ORDER — SODIUM CHLORIDE 0.9 % IV SOLN
2.0000 g | Freq: Three times a day (TID) | INTRAVENOUS | Status: DC
  Administered 2022-01-19 – 2022-01-21 (×7): 2 g via INTRAVENOUS
  Filled 2022-01-18 (×7): qty 12.5

## 2022-01-18 MED ORDER — LACTATED RINGERS IV BOLUS (SEPSIS)
500.0000 mL | Freq: Once | INTRAVENOUS | Status: AC
  Administered 2022-01-18: 500 mL via INTRAVENOUS

## 2022-01-18 MED ORDER — VANCOMYCIN HCL 750 MG/150ML IV SOLN
750.0000 mg | Freq: Three times a day (TID) | INTRAVENOUS | Status: DC
  Administered 2022-01-19 – 2022-01-21 (×7): 750 mg via INTRAVENOUS
  Filled 2022-01-18 (×9): qty 150

## 2022-01-18 MED ORDER — VANCOMYCIN HCL IN DEXTROSE 1-5 GM/200ML-% IV SOLN
1000.0000 mg | Freq: Once | INTRAVENOUS | Status: AC
  Administered 2022-01-18: 1000 mg via INTRAVENOUS
  Filled 2022-01-18: qty 200

## 2022-01-18 MED ORDER — LACTATED RINGERS IV BOLUS (SEPSIS)
1000.0000 mL | Freq: Once | INTRAVENOUS | Status: AC
  Administered 2022-01-18: 1000 mL via INTRAVENOUS

## 2022-01-18 MED ORDER — LACTATED RINGERS IV SOLN: INTRAVENOUS | Status: AC

## 2022-01-18 MED ORDER — VANCOMYCIN HCL 750 MG/150ML IV SOLN
750.0000 mg | Freq: Once | INTRAVENOUS | Status: AC
  Administered 2022-01-18: 750 mg via INTRAVENOUS
  Filled 2022-01-18: qty 150

## 2022-01-18 NOTE — H&P (Signed)
?History and Physical  ? ? ?Patient: Gabriel Hanson BZJ:[id number] DOB: 10/12/1950 ?DOA: 01/18/2022 ?DOS: the patient was seen and examined on 01/19/2022 ?PCP: [name], MD  ?Patient coming from: Home ? ?Chief Complaint:  ?Chief Complaint  ?Patient presents with  ? Altered Mental Status  ? ?HPI: Gabriel Hanson is a 71 y.o. male with medical history significant of MS and tobacco use who presents to the emergency department via EMS due to altered mental status.  Altered mental improved since arrival to the ED, he was alert and oriented x3, but was unable to remember the incident that resulted in him coming to the ED.  History was obtained from ED physician and ED medical record.  Per report, he was last seen by his brother in law few days ago.  EMS was activated and on arrival of EMS team patient was found lying in his urine.  He denies chest pain, headache, nausea, vomiting, abdominal pain. ? ?ED Course:  ?In the emergency department, he was febrile with a temperature of 102.28F, tachypneic, tachycardic, BP was 152/81, O2 sat was 93% on room air.  Work-up in the ED showed normal CBC except leukocytosis, normal BMP except for hyperglycemia, total CK 58, ammonia 22, lactic acid 1.8, urinalysis was unimpressive for UTI. ?CT head without contrast showed no acute intracranial hemorrhage ?Chest x-ray showed dense airspace consolidation in the periphery of the mid and upper lung zone, concerning for pneumonia. ?Patient was started on IV cefepime, vancomycin and Flagyl.  We shall continue with IV cefepime and vancomycin.  IV hydration was provided.  Hospitalist was asked to admit patient for further evaluation and management. ? ?Review of Systems: ?Review of systems as noted in the HPI. All other systems reviewed and are negative. ? ? ?Past Medical History:  ?Diagnosis Date  ? Depressive disorder, not elsewhere classified   ? Disorder of eye, unspecified   ? Memory loss   ? Multiple sclerosis ([place])   ? Other  persistent mental disorders due to conditions classified elsewhere   ? Other psoriasis   ? Psychosexual dysfunction, unspecified   ? ?Past Surgical History:  ?Procedure Laterality Date  ? KNEE ARTHROSCOPY    ? right   ? TONSILLECTOMY    ? ? ?Social History:  reports that he has been smoking cigarettes. He has a 44.00 pack-year smoking history. He has never used smokeless tobacco. He reports current alcohol use. He reports current drug use. Drug: Marijuana. ? ? ?No Known Allergies ? ?Family History  ?Problem Relation Age of Onset  ? Breast cancer Mother   ? COPD Father   ? Heart disease Unknown   ?  ? ? ?Prior to Admission medications   ?Medication Sig Start Date End Date Taking? Authorizing Provider  ?amphetamine-dextroamphetamine (ADDERALL) 20 MG tablet Take one or two pills a day as needed. ?Patient taking differently: Take 20-40 mg by mouth daily as needed (focus). 12/15/21  Yes Sater, Nanine Means, MD  ?Diroximel Fumarate (VUMERITY) 231 MG CPDR Take 2 capsules by mouth 2 (two) times daily. 12/15/21  Yes Sater, Nanine Means, MD  ?naproxen sodium (ALEVE) 220 MG tablet Take 660 mg by mouth daily.   Yes [provider]  ?tamsulosin (FLOMAX) 0.4 MG CAPS capsule Take 2 capsules (0.8 mg total) by mouth daily. 01/16/22  Yes Sater, Nanine Means, MD  ?predniSONE (DELTASONE) 50 MG tablet Take 600 mg by mouth See admin instructions. Qd x 3 days ?Patient not taking: Reported on 01/18/2022  [provider]  ? ? ?Physical Exam: ?BP 131/74   Pulse 73   Temp 98.2 ?F (36.8 ?C) (Oral)   Resp 19   Ht 6' 1"  (1.854 m)   Wt 81.6 kg   SpO2 95%   BMI 23.75 kg/m?  ? ?General: 71 y.o. year-old male ill appearing, but in no acute distress.  Alert and oriented x3. ?HEENT: NCAT, EOMI, dry mucous membranes ?Neck: Supple, trachea medial ?Cardiovascular: Regular rate and rhythm with no rubs or gallops.  No thyromegaly or JVD noted.  No lower extremity edema. 2/4 pulses in all 4 extremities. ?Respiratory: Clear to auscultation with  no wheezes or rales. Good inspiratory effort. ?Abdomen: Soft, nontender nondistended with normal bowel sounds x4 quadrants. ?Muskuloskeletal: No cyanosis, clubbing or edema noted bilaterally ?Neuro: CN II-XII intact, strength 5/5 x 4, sensation, reflexes intact ?Skin: No ulcerative lesions noted or rashes ?Psychiatry: Mood is appropriate for condition and setting ?   ?   ?   ?Labs on Admission:  ?Basic Metabolic Panel: ?Recent Labs  ?Lab 01/18/22 ?1945  ?NA 135  ?K 3.8  ?CL 102  ?CO2 23  ?GLUCOSE 146*  ?BUN 22  ?CREATININE 0.89  ?CALCIUM 8.7*  ? ?Liver Function Tests: ?Recent Labs  ?Lab 01/18/22 ?1945  ?AST 21  ?ALT 18  ?ALKPHOS 57  ?BILITOT 1.3*  ?PROT 6.2*  ?ALBUMIN 3.0*  ? ?No results for input(s): LIPASE, AMYLASE in the last 168 hours. ?Recent Labs  ?Lab 01/18/22 ?2000  ?AMMONIA 21  ? ?CBC: ?Recent Labs  ?Lab 01/18/22 ?1945  ?WBC 15.9*  ?NEUTROABS 14.8*  ?HGB 15.4  ?HCT 45.8  ?MCV 94.2  ?PLT 207  ? ?Cardiac Enzymes: ?Recent Labs  ?Lab 01/18/22 ?1945  ?CKTOTAL 58  ? ? ?BNP (last 3 results) ?No results for input(s): BNP in the last 8760 hours. ? ?ProBNP (last 3 results) ?No results for input(s): PROBNP in the last 8760 hours. ? ?CBG: ?No results for input(s): GLUCAP in the last 168 hours. ? ?Radiological Exams on Admission: ?CT Head Wo Contrast ? ?Result Date: 01/18/2022 ?CLINICAL DATA:  Mental status change, unknown cause. EXAM: CT HEAD WITHOUT CONTRAST TECHNIQUE: Contiguous axial images were obtained from the base of the skull through the vertex without intravenous contrast. RADIATION DOSE REDUCTION: This exam was performed according to the departmental dose-optimization program which includes automated exposure control, adjustment of the mA and/or kV according to patient size and/or use of iterative reconstruction technique. COMPARISON:  06/26/2021 FINDINGS: Brain: No acute intracranial hemorrhage, midline shift or mass effect. No extra-axial fluid collection. Mild periventricular white matter hypodensities are  noted bilaterally. No hydrocephalus. Vascular: No hyperdense vessel or unexpected calcification. Skull: Normal. Negative for fracture or focal lesion. Sinuses/Orbits: No acute finding. Other: Questionable subcutaneous fat stranding over the frontal bone on the left. IMPRESSION: 1. No acute intracranial hemorrhage. 2. Scattered periventricular white matter hypodensities may represent chronic microvascular ischemic changes versus changes related to patient's known multiple sclerosis. 3. Questionable soft tissue swelling and subcutaneous fat stranding over the frontal bone on the left, possible contusion. Correlation with physical exam is recommended. Electronically Signed   By: Brett Fairy M.D.   On: 01/18/2022 22:21  ? ?DG Chest Port 1 View ? ?Result Date: 01/18/2022 ?CLINICAL DATA:  Questionable sepsis - evaluate for abnormality EXAM: PORTABLE CHEST 1 VIEW COMPARISON:  Radiograph 09/02/2014, chest CT 10/11/2017 FINDINGS: There is dense airspace consolidation in the periphery of the mid and upper lung zone. Surrounding patchy opacity. Subsegmental atelectasis at the left lung  base. The heart is normal in size. Normal mediastinal contours. No significant pleural effusion or pneumothorax. No pulmonary edema. Remote left proximal humerus fracture. IMPRESSION: Dense airspace consolidation in the periphery of the mid and upper lung zone, concerning for pneumonia. Recommend radiographic follow-up to resolution. Electronically Signed   By: Keith Rake M.D.   On: 01/18/2022 21:08   ? ?EKG: I independently viewed the EKG done and my findings are as followed: Sinus tachycardia at a rate of 115 bpm ? ?Assessment/Plan ?Present on Admission: ? CAP (community acquired pneumonia) ? Multiple sclerosis ([place]) ? ?Principal Problem: ?  CAP (community acquired pneumonia) ?Active Problems: ?  Multiple sclerosis ([place]) ?  Altered mental status ?  Leukocytosis ?  Hypoalbuminemia due to protein-calorie malnutrition ([place]) ?   Hyperglycemia ?  Dehydration ?  Sepsis ([place]) ? ? ?Altered mental status in the setting of sepsis secondary to CAP POA ?Altered mental status have improved since arrival to the ED ?Patient met sepsis criteria due to being febrile

## 2022-01-18 NOTE — Progress Notes (Signed)
Pharmacy Antibiotic Note ? ?Gabriel Hanson is a 71 y.o. male admitted on 01/18/2022 with sepsis.  Pharmacy has been consulted for cefepime and vancomycin dosing. ? ?WBC 15.9, fever of 102.43F on presentation. Patient was given vancomycin 1000 mg IV x1; will add vancomycin 750 mg IV x1 for a total of vancomycin 1750 mg IV loading dose (~20 mg/kg). Will follow with maintenance dose:  ?Scr used: 0.89 mg/dL ?Weight: 81.6 kg ?Vd coeff: 0.72 L/kg ?Est AUC: 498.4 ? ? ?Plan: ?Cefepime 2 g IV q8h  ?Vancomycin 1750 mg IV followed by vancomycin 750 mg IV q8h  ?F/u clinical course, fever curve, cultures ?Monitor renal function  ? ? ?Height: 6\' 1"  (185.4 cm) ?Weight: 81.6 kg (180 lb) ?IBW/kg (Calculated) : 79.9 ? ?Temp (24hrs), Avg:102.7 ?F (39.3 ?C), Min:102.7 ?F (39.3 ?C), Max:102.7 ?F (39.3 ?C) ? ?Recent Labs  ?Lab 01/18/22 ?1945  ?WBC 15.9*  ?  ?CrCl cannot be calculated (Patient's most recent lab result is older than the maximum 21 days allowed.).   ? ?No Known Allergies ? ?Antimicrobials this admission: ?Cefepime 4/12 >>  ?Vancomycin 4/12 >>  ?Metronidazole x1 ? ?Dose adjustments this admission: ?N/A ? ?Microbiology results: ?4/12 BCx: pending ?4/12 UCx: pending  ? ? ?Thank you for allowing pharmacy to be a part of this patient?s care. ? ?Zenaida Deed, PharmD ?PGY1 Acute Care Pharmacy Resident  ?Phone: [phone] ?01/18/2022  9:48 PM ? ?Please check [email or web address] for unit-specific pharmacy phone numbers. ? ? ?

## 2022-01-18 NOTE — ED Provider Notes (Signed)
?[facility] EMERGENCY DEPARTMENT ?Provider Note ? ? ?CSN: [id number] ?Arrival date & time: 01/18/22  1936 ? ?  ? ?History ? ?Chief Complaint  ?Patient presents with  ? Altered Mental Status  ? ? ?KDYN VONBEHREN is a 71 y.o. male. ? ? ?Altered Mental Status ?Associated symptoms: fever   ? ?Patient has a history of MS, depression, psoriasis.  Patient also has a history of smoking.  Patient was last seen by his ex brother-in-law couple days ago.  Patient was found down today however at home unresponsive lying in his urine.  Patient tells me he was feeling ill a few days ago but is having difficulty specifically telling me what was bothering him.  He denies any trouble with any headache or chest pain now.  No vomiting or diarrhea. ? ?Home Medications ?Prior to Admission medications   ?Medication Sig Start Date End Date Taking? Authorizing Provider  ?amphetamine-dextroamphetamine (ADDERALL) 20 MG tablet Take one or two pills a day as needed. 12/15/21   Sater, Pearletha Furl, MD  ?Diroximel Fumarate (VUMERITY) 231 MG CPDR Take 2 capsules by mouth 2 (two) times daily. 12/15/21   Sater, Pearletha Furl, MD  ?naproxen sodium (ALEVE) 220 MG tablet Take 660 mg by mouth daily.    [provider]  ?tamsulosin (FLOMAX) 0.4 MG CAPS capsule Take 2 capsules (0.8 mg total) by mouth daily. 01/16/22   Sater, Pearletha Furl, MD  ?   ? ?Allergies    ?Patient has no known allergies.   ? ?Review of Systems   ?Review of Systems  ?Constitutional:  Positive for fever.  ? ?Physical Exam ?Updated Vital Signs ?BP (!) 154/82   Pulse (!) 115   Temp (!) 102.7 ?F (39.3 ?C) (Oral)   Resp (!) 24   Ht 1.854 m (6\' 1" )   Wt 81.6 kg   SpO2 93%   BMI 23.75 kg/m?  ?Physical Exam ?Vitals and nursing note reviewed.  ?Constitutional:   ?   Appearance: He is well-developed. He is ill-appearing.  ?HENT:  ?   Head: Normocephalic and atraumatic.  ?   Right Ear: External ear normal.  ?   Left Ear: External ear normal.  ?   Mouth/Throat:  ?   Mouth:  Mucous membranes are dry.  ?Eyes:  ?   General: No scleral icterus.    ?   Right eye: No discharge.     ?   Left eye: No discharge.  ?   Conjunctiva/sclera: Conjunctivae normal.  ?Neck:  ?   Trachea: No tracheal deviation.  ?Cardiovascular:  ?   Rate and Rhythm: Normal rate and regular rhythm.  ?Pulmonary:  ?   Effort: Pulmonary effort is normal. No respiratory distress.  ?   Breath sounds: Normal breath sounds. No stridor. No wheezing or rales.  ?Abdominal:  ?   General: Bowel sounds are normal. There is no distension.  ?   Palpations: Abdomen is soft.  ?   Tenderness: There is no abdominal tenderness. There is no guarding or rebound.  ?Musculoskeletal:     ?   General: No tenderness or deformity.  ?   Cervical back: Neck supple.  ?Skin: ?   General: Skin is warm and dry.  ?   Findings: No rash.  ?Neurological:  ?   General: No focal deficit present.  ?   Mental Status: He is alert.  ?   Cranial Nerves: No cranial nerve deficit (no facial droop, extraocular movements intact, no slurred speech).  ?  Sensory: No sensory deficit.  ?   Motor: Weakness present. No abnormal muscle tone or seizure activity.  ?   Coordination: Coordination normal.  ?   Comments: Patient is slow to respond but is able to answer questions, follows commands oriented to person and place, movements are slow, has difficulty lifting arms or legs off the bed but is able to move all extremities equally  ?Psychiatric:     ?   Mood and Affect: Mood normal.  ? ? ?ED Results / Procedures / Treatments   ?Labs ?(all labs ordered are listed, but only abnormal results are displayed) ?Labs Reviewed - No data to display ? ?EKG ?EKG Interpretation ? ?Date/Time:  Wednesday January 18 2022 19:57:32 EDT ?Ventricular Rate:  115 ?PR Interval:  181 ?QRS Duration: 87 ?QT Interval:  335 ?QTC Calculation: 464 ?R Axis:   -33 ?Text Interpretation: Sinus tachycardia Biatrial enlargement Left axis deviation Anteroseptal infarct, age indeterminate Since last tracing rate  faster Confirmed by Linwood Dibbles [phone]) on 01/18/2022 8:16:06 PM ? ?Radiology ?No results found. ? ?Procedures ?Procedures  ? ? ?Medications Ordered in ED ?Medications  ?lactated ringers infusion (has no administration in time range)  ?lactated ringers bolus 1,000 mL (has no administration in time range)  ?  And  ?lactated ringers bolus 1,000 mL (has no administration in time range)  ?  And  ?lactated ringers bolus 500 mL (has no administration in time range)  ?ceFEPIme (MAXIPIME) 2 g in sodium chloride 0.9 % 100 mL IVPB (has no administration in time range)  ?metroNIDAZOLE (FLAGYL) IVPB 500 mg (has no administration in time range)  ?vancomycin (VANCOCIN) IVPB 1000 mg/200 mL premix (has no administration in time range)  ? ? ?ED Course/ Medical Decision Making/ A&P ?Clinical Course as of 01/18/22 2314  ?Wed Jan 18, 2022  ?2207 Urinalysis, Routine w reflex microscopic(!) ?Urinalysis not suggestive of infection [JK]  ?2207 CBC with Differential(!) ?Leukocytosis noted [JK]  ?2207 Comprehensive metabolic panel(!) ?Decreased protein and calcium [JK]  ?2208 DG Chest Port 1 View ?Radiology images and report reviewed.  Dense pneumonia noted right lung [JK]  ?2239 CT Head Wo Contrast ?Head CT shows findings consistent with his known MS.  Questionable frontal contusion although no obvious abnormality noted on exam [JK]  ?  ?Clinical Course User Index ?[JK] Linwood Dibbles, MD  ? ?                        ?Medical Decision Making ?DDX sepsis, stroke, dehydration, renal failure, metabolic derangement, ? ?Amount and/or Complexity of Data Reviewed ?Labs: ordered. Decision-making details documented in ED Course. ?Radiology: ordered. Decision-making details documented in ED Course. ?ECG/medicine tests: ordered. ? ?Risk ?OTC drugs. ?Prescription drug management. ?Decision regarding hospitalization. ?Risk Details: Workup concerning for sepsis with high fever, weakness.  CXR notable for acute pneumonia, source of his infection and weakness.   High risk for decompensation with his age, MS.  Started on borad spectrum abx, IV fluids, sepsis protocol.  Case discussed with Dr Thomes Dinning for admission, further treatment. ? ? ?Pt presented with fever, weakness.   ? ? ? ? ? ? ? ?Final Clinical Impression(s) / ED Diagnoses ?Final diagnoses:  ?Community acquired pneumonia of right lung, unspecified part of lung  ?Sepsis, due to unspecified organism, unspecified whether acute organ dysfunction present [facility])  ? ? ?  ?Linwood Dibbles, MD ?01/19/22 [phone] ? ?

## 2022-01-18 NOTE — ED Triage Notes (Addendum)
Pt bib [facility] from home for ams. Pt was found laying in urine not able to answer questions. Hx of MS. Unknown time frame of how long patient has been altered but possibly 3 days. Denies urinary symptoms. Pt oriented to self, place, situation on arrival. ? ?CBG 169, BP 148/74, HR 120, RR 20, Spo2: 92% RA ?

## 2022-01-18 NOTE — Sepsis Progress Note (Signed)
Elink following Code Sepsis. 

## 2022-01-19 ENCOUNTER — Inpatient Hospital Stay (HOSPITAL_COMMUNITY): Payer: Medicare Other

## 2022-01-19 DIAGNOSIS — A419 Sepsis, unspecified organism: Secondary | ICD-10-CM

## 2022-01-19 DIAGNOSIS — R4182 Altered mental status, unspecified: Secondary | ICD-10-CM

## 2022-01-19 DIAGNOSIS — J189 Pneumonia, unspecified organism: Secondary | ICD-10-CM | POA: Diagnosis not present

## 2022-01-19 DIAGNOSIS — G35 Multiple sclerosis: Secondary | ICD-10-CM | POA: Diagnosis not present

## 2022-01-19 DIAGNOSIS — E8809 Other disorders of plasma-protein metabolism, not elsewhere classified: Secondary | ICD-10-CM

## 2022-01-19 DIAGNOSIS — G9340 Encephalopathy, unspecified: Secondary | ICD-10-CM

## 2022-01-19 DIAGNOSIS — D72829 Elevated white blood cell count, unspecified: Secondary | ICD-10-CM

## 2022-01-19 DIAGNOSIS — R400 Somnolence: Secondary | ICD-10-CM | POA: Diagnosis not present

## 2022-01-19 DIAGNOSIS — E860 Dehydration: Secondary | ICD-10-CM

## 2022-01-19 DIAGNOSIS — R739 Hyperglycemia, unspecified: Secondary | ICD-10-CM

## 2022-01-19 DIAGNOSIS — R6520 Severe sepsis without septic shock: Secondary | ICD-10-CM

## 2022-01-19 LAB — COMPREHENSIVE METABOLIC PANEL
ALT: 15 U/L (ref 0–44)
AST: 17 U/L (ref 15–41)
Albumin: 2.3 g/dL — ABNORMAL LOW (ref 3.5–5.0)
Alkaline Phosphatase: 49 U/L (ref 38–126)
Anion gap: 4 — ABNORMAL LOW (ref 5–15)
BUN: 19 mg/dL (ref 8–23)
CO2: 27 mmol/L (ref 22–32)
Calcium: 8 mg/dL — ABNORMAL LOW (ref 8.9–10.3)
Chloride: 102 mmol/L (ref 98–111)
Creatinine, Ser: 0.82 mg/dL (ref 0.61–1.24)
GFR, Estimated: >60 mL/min (ref >60)
Glucose, Bld: 122 mg/dL — ABNORMAL HIGH (ref 70–99)
Potassium: 3.6 mmol/L (ref 3.5–5.1)
Sodium: 133 mmol/L — ABNORMAL LOW (ref 135–145)
Total Bilirubin: 1.2 mg/dL (ref 0.3–1.2)
Total Protein: 5.4 g/dL — ABNORMAL LOW (ref 6.5–8.1)

## 2022-01-19 LAB — PHOSPHORUS: Phosphorus: 2.5 mg/dL (ref 2.5–4.6)

## 2022-01-19 LAB — ETHANOL: Alcohol, Ethyl (B): <10 mg/dL (ref <10)

## 2022-01-19 LAB — CBC
HCT: 37.5 % — ABNORMAL LOW (ref 39.0–52.0)
Hemoglobin: 12.7 g/dL — ABNORMAL LOW (ref 13.0–17.0)
MCH: 31.4 pg (ref 26.0–34.0)
MCHC: 33.9 g/dL (ref 30.0–36.0)
MCV: 92.6 fL (ref 80.0–100.0)
Platelets: 193 10*3/uL (ref 150–400)
RBC: 4.05 MIL/uL — ABNORMAL LOW (ref 4.22–5.81)
RDW: 13.1 % (ref 11.5–15.5)
WBC: 12.7 10*3/uL — ABNORMAL HIGH (ref 4.0–10.5)
nRBC: 0 % (ref 0.0–0.2)

## 2022-01-19 LAB — STREP PNEUMONIAE URINARY ANTIGEN: Strep Pneumo Urinary Antigen: NEGATIVE

## 2022-01-19 LAB — MAGNESIUM: Magnesium: 2.1 mg/dL (ref 1.7–2.4)

## 2022-01-19 LAB — LACTIC ACID, PLASMA: Lactic Acid, Venous: 1.3 mmol/L (ref 0.5–1.9)

## 2022-01-19 LAB — HIV ANTIBODY (ROUTINE TESTING W REFLEX): HIV Screen 4th Generation wRfx: NONREACTIVE

## 2022-01-19 MED ORDER — ENOXAPARIN SODIUM 40 MG/0.4ML IJ SOSY
40.0000 mg | PREFILLED_SYRINGE | INTRAMUSCULAR | Status: DC
  Administered 2022-01-19 – 2022-01-23 (×5): 40 mg via SUBCUTANEOUS
  Filled 2022-01-19 (×5): qty 0.4

## 2022-01-19 MED ORDER — TAMSULOSIN HCL 0.4 MG PO CAPS
0.8000 mg | ORAL_CAPSULE | Freq: Every day | ORAL | Status: DC
Start: 2022-01-19 — End: 2022-01-23
  Administered 2022-01-19 – 2022-01-23 (×5): 0.8 mg via ORAL
  Filled 2022-01-19 (×5): qty 2

## 2022-01-19 MED ORDER — ENSURE ENLIVE PO LIQD
237.0000 mL | Freq: Two times a day (BID) | ORAL | Status: DC
  Administered 2022-01-19 – 2022-01-23 (×9): 237 mL via ORAL
  Filled 2022-01-19 (×2): qty 237

## 2022-01-19 MED ORDER — DM-GUAIFENESIN ER 30-600 MG PO TB12
1.0000 | ORAL_TABLET | Freq: Two times a day (BID) | ORAL | Status: DC
  Administered 2022-01-19 – 2022-01-23 (×9): 1 via ORAL
  Filled 2022-01-19 (×12): qty 1

## 2022-01-19 MED ORDER — ACETAMINOPHEN 325 MG PO TABS
650.0000 mg | ORAL_TABLET | Freq: Four times a day (QID) | ORAL | Status: DC | PRN
  Administered 2022-01-19 – 2022-01-20 (×2): 650 mg via ORAL
  Filled 2022-01-19 (×3): qty 2

## 2022-01-19 MED ORDER — ACETAMINOPHEN 650 MG RE SUPP: 650.0000 mg | Freq: Four times a day (QID) | RECTAL | Status: DC | PRN

## 2022-01-19 NOTE — Progress Notes (Signed)
?   01/19/22 1900  ?Assess: MEWS Score  ?Temp 98.3 ?F (36.8 ?C)  ?BP (!) 179/88  ?Pulse Rate (!) 118  ?ECG Heart Rate (!) 118  ?Resp (!) 24  ?SpO2 95 %  ?Assess: MEWS Score  ?MEWS Temp 0  ?MEWS Systolic 0  ?MEWS Pulse 2  ?MEWS RR 1  ?MEWS LOC 0  ?MEWS Score 3  ?MEWS Score Color Yellow  ?Assess: if the MEWS score is Yellow or Red  ?Were vital signs taken at a resting state? Yes  ?Focused Assessment No change from prior assessment  ?Early Detection of Sepsis Score *See Row Information* High  ?MEWS guidelines implemented *See Row Information* Yes  ?Treat  ?MEWS Interventions Other (Comment)  ?Take Vital Signs  ?Increase Vital Sign Frequency  Yellow: Q 2hr X 2 then Q 4hr X 2, if remains yellow, continue Q 4hrs  ?Notify: Charge Nurse/RN  ?Name of Charge Nurse/RN Notified April gRN  ?Date Charge Nurse/RN Notified 01/19/22  ?Time Charge Nurse/RN Notified 1908  ? ? ?

## 2022-01-19 NOTE — ED Notes (Signed)
Pt intermittently coughing/gurgling after pills and drinks requiring oral suctioning to clear, RN updated Dr Jerral Ralph who asked for ST consult. ?

## 2022-01-19 NOTE — Evaluation (Signed)
Clinical/Bedside Swallow Evaluation ?Patient Details  ?Name: Gabriel Hanson ?MRN: [id number] ?Date of Birth: December 04, 1950 ? ?Today's Date: 01/19/2022 ?Time: SLP Start Time (ACUTE ONLY): 1630 SLP Stop Time (ACUTE ONLY): 1655 ?SLP Time Calculation (min) (ACUTE ONLY): 25 min ? ?Past Medical History:  ?Past Medical History:  ?Diagnosis Date  ? Depressive disorder, not elsewhere classified   ? Disorder of eye, unspecified   ? Memory loss   ? Multiple sclerosis ([place])   ? Other persistent mental disorders due to conditions classified elsewhere   ? Other psoriasis   ? Psychosexual dysfunction, unspecified   ? ?Past Surgical History:  ?Past Surgical History:  ?Procedure Laterality Date  ? KNEE ARTHROSCOPY    ? right   ? TONSILLECTOMY    ? ?HPI:  ?Patient is a 71 y.o. male with PMH: MS, tobacco use who presented to the ED via EMS due to [place] after his brother in law went to his house and found him on the floor lying in his urine. in ED, patient was febrile with temperature at 102.7 F, he was tachycardic, tachypneic, BP 152/81, oxygen saturations 93% on RA. CT head did not show any acute intracranial abnormalities, UTI negative. CXR showed dense airspace consolidation in the periphery of the mid and upper lung zone, concerning for PNA. RN observed that patient was intermittently gurgling and coughing after pills and drinks and SLP swallow evaluation was ordered.  ?  ?Assessment / Plan / Recommendation  ?Clinical Impression ? Patient presenting with questionable s/s dysphagia as per this bedside/clinical swallow evaluation. He exhibited intermittent coughing after thin liquids via cup sips or straw sips however majority of the time he was exhibiting a productive cough. Voice remained clear throughout. SLP is recommending to continue on current diet or regular solids, thin liquids and plan for SLP to f/u at least one time to ensure toleration. ?SLP Visit Diagnosis: Dysphagia, unspecified (R13.10) ?   ?Aspiration Risk ? Mild  aspiration risk  ?  ?Diet Recommendation Regular;Thin liquid  ? ?Liquid Administration via: Straw;Cup ?Medication Administration: Whole meds with liquid ?Supervision: Patient able to self feed;Intermittent supervision to cue for compensatory strategies ?Compensations: Minimize environmental distractions;Slow rate;Small sips/bites ?Postural Changes: Seated upright at 90 degrees  ?  ?Other  Recommendations Oral Care Recommendations: Oral care BID   ? ?Recommendations for follow up therapy are one component of a multi-disciplinary discharge planning process, led by the attending physician.  Recommendations may be updated based on patient status, additional functional criteria and insurance authorization. ? ?Follow up Recommendations No SLP follow up  ? ? ?  ?Assistance Recommended at Discharge Intermittent Supervision/Assistance  ?Functional Status Assessment Patient has had a recent decline in their functional status and demonstrates the ability to make significant improvements in function in a reasonable and predictable amount of time.  ?Frequency and Duration min 1 x/week  ?1 week ?  ?   ? ?Prognosis Prognosis for Safe Diet Advancement: Good  ? ?  ? ?Swallow Study   ?General Date of Onset: 01/19/22 ?HPI: Patient is a 71 y.o. male with PMH: MS, tobacco use who presented to the ED via EMS due to AMS after his brother in law went to his house and found him on the floor lying in his urine. in ED, patient was febrile with temperature at 102.7 F, he was tachycardic, tachypneic, BP 152/81, oxygen saturations 93% on RA. CT head did not show any acute intracranial abnormalities, UTI negative. CXR showed dense airspace consolidation in the periphery of the  mid and upper lung zone, concerning for PNA. RN observed that patient was intermittently gurgling and coughing after pills and drinks and SLP swallow evaluation was ordered. ?Type of Study: Bedside Swallow Evaluation ?Previous Swallow Assessment: none found ?Diet Prior to  this Study: Regular;Thin liquids ?Temperature Spikes Noted: No ?Respiratory Status: Room air ?History of Recent Intubation: No ?Behavior/Cognition: Alert;Cooperative;Pleasant mood;Lethargic/Drowsy ?Oral Cavity Assessment: Within Functional Limits ?Oral Care Completed by SLP: Yes ?Oral Cavity - Dentition: Adequate natural dentition ?Vision: Functional for self-feeding ?Self-Feeding Abilities: Able to feed self ?Patient Positioning: Upright in bed ?Baseline Vocal Quality: Normal ?Volitional Cough: Congested;Strong ?Volitional Swallow: Able to elicit  ?  ?Oral/Motor/Sensory Function Overall Oral Motor/Sensory Function: Within functional limits   ?Ice Chips     ?Thin Liquid Thin Liquid: Impaired ?Presentation: Straw;Cup;Self Fed ?Pharyngeal  Phase Impairments: Throat Clearing - Delayed ?Other Comments: throat clearing was intermittent and productive majority of the time with patient expectorating clear, light orange sputum  ?  ?Nectar Thick Nectar Thick Liquid: Not tested   ?Honey Thick Honey Thick Liquid: Not tested   ?Puree Puree: Within functional limits   ?Solid ? ? ?  Solid: Not tested  ? ?  ? ?Sonia Baller, MA, CCC-SLP ?Speech Therapy ? ? ? ? ? ?

## 2022-01-19 NOTE — Progress Notes (Signed)
?PROGRESS NOTE ? ? ? ?Gabriel Hanson  STM:[id number] DOB: 08-10-1951 DOA: 01/18/2022 ?PCP: [name], MD  ? ? ?Brief Narrative:  ?71 year old gentleman with history of multiple sclerosis and smoker, BPH presented to the ER with altered mental status found at home by family members.  Patient does have history of multiple sclerosis and recently worsening cognitive decline.  Seen by neurologist on 4/10 and treated with high-dose steroids.  In the emergency room, temperature 102.7, tachypneic and tachycardic, blood pressure stable.  Leukocytosis, lactic acid 1.8.  Chest x-ray with dense right mid and upper lobe consolidation concerning for pneumonia.  IV fluid resuscitation, started on antibiotics and admitted to the hospital. ? ? ?Assessment & Plan: ?  ?Severe sepsis present on admission: ?Sepsis present as the patient has 2 of the following  ?Temperature 102 ?Heart rate >90 ?WBC count >12000 ,  ?And suspect his source of infections is pneumonia. ?Adequately resuscitated with IV fluid, peripheral perfusion is adequate.  Continue maintenance IV fluid today. ?Blood cultures and urine cultures pending, continue vancomycin and cefepime.  Patient unable to expectorate any sputum. ?Chest x-ray with impressive consolidation, will check CT scan of the lungs today to look for any complications or empyema. ?Resume Flomax.  We will check renal ultrasound to rule out any obstruction/hydronephrosis. ? ?Acute metabolic encephalopathy in the setting of severe sepsis, underlying cognitive dysfunction: CT head was essentially normal.  No focal deficits.  Monitor. ? ?History of multiple sclerosis: Cognitive decline.  Currently no new neurological deficits.  We will hold off on Vumerity.  He completed 600 mg prednisone for 3 days yesterday. ? ? ?DVT prophylaxis: enoxaparin (LOVENOX) injection 40 mg Start: 01/19/22 1000 ?SCDs Start: 01/19/22 0028 ? ? ?Code Status: Full code ?Family Communication: Brother on the phone ?Disposition  Plan: Status is: Inpatient ?Remains inpatient appropriate because: Treated for severe sepsis ?  ? ? ?Consultants:  ?None ? ?Procedures:  ?None ? ?Antimicrobials:  ?Vancomycin, Flagyl and cefepime 4/12--- ? ? ?Subjective: ?Patient seen and examined in the emergency room.  He is on room air.  Poor historian.  He answers yes/no for most of the questions.  He answers yes to shortness of breath but he looks comfortable.  Temperature 102 and persistent. ? ?Objective: ?Vitals:  ? 01/19/22 1030 01/19/22 1100 01/19/22 1123 01/19/22 1200  ?BP: (!) 158/82 (!) 158/85  (!) 149/81  ?Pulse: (!) 108 (!) 110  (!) 104  ?Resp: (!) 22 (!) 21  (!) 26  ?Temp:   (!) 102 ?F (38.9 ?C)   ?TempSrc:   Oral   ?SpO2: 94% 94%  94%  ?Weight:      ?Height:      ? ? ?Intake/Output Summary (Last 24 hours) at 01/19/2022 1305 ?Last data filed at 01/19/2022 8921 ?Gross per 24 hour  ?Intake 4749.69 ml  ?Output --  ?Net 4749.69 ml  ? ?Filed Weights  ? 01/18/22 1947  ?Weight: 81.6 kg  ? ? ?Examination: ? ?General exam: Appears sick.  Febrile. ?Patient is alert and awake on stimulation, stays with eye closed.  Looks fairly comfortable at rest. ?Respiratory system: Clear to auscultation. Respiratory effort normal.  No added sounds. ?Cardiovascular system: S1 & S2 heard, RRR.  Tachycardic. ?Gastrointestinal system: Abdomen is nondistended, soft and nontender. No organomegaly or masses felt. Normal bowel sounds heard. ?Flat affect.  Anxious. ? ? ? ?Data Reviewed: I have personally reviewed following labs and imaging studies ? ?CBC: ?Recent Labs  ?Lab 01/18/22 ?1945 01/19/22 ?0330  ?WBC 15.9* 12.7*  ?  NEUTROABS 14.8*  --   ?HGB 15.4 12.7*  ?HCT 45.8 37.5*  ?MCV 94.2 92.6  ?PLT 207 193  ? ?Basic Metabolic Panel: ?Recent Labs  ?Lab 01/18/22 ?1945 01/19/22 ?0330  ?NA 135 133*  ?K 3.8 3.6  ?CL 102 102  ?CO2 23 27  ?GLUCOSE 146* 122*  ?BUN 22 19  ?CREATININE 0.89 0.82  ?CALCIUM 8.7* 8.0*  ?MG  --  2.1  ?PHOS  --  2.5  ? ?GFR: ?Estimated Creatinine Clearance: 94.7  mL/min (by C-G formula based on SCr of 0.82 mg/dL). ?Liver Function Tests: ?Recent Labs  ?Lab 01/18/22 ?1945 01/19/22 ?0330  ?AST 21 17  ?ALT 18 15  ?ALKPHOS 57 49  ?BILITOT 1.3* 1.2  ?PROT 6.2* 5.4*  ?ALBUMIN 3.0* 2.3*  ? ?No results for input(s): LIPASE, AMYLASE in the last 168 hours. ?Recent Labs  ?Lab 01/18/22 ?2000  ?AMMONIA 21  ? ?Coagulation Profile: ?Recent Labs  ?Lab 01/18/22 ?1945  ?INR 1.1  ? ?Cardiac Enzymes: ?Recent Labs  ?Lab 01/18/22 ?1945  ?CKTOTAL 58  ? ?BNP (last 3 results) ?No results for input(s): PROBNP in the last 8760 hours. ?HbA1C: ?No results for input(s): HGBA1C in the last 72 hours. ?CBG: ?No results for input(s): GLUCAP in the last 168 hours. ?Lipid Profile: ?No results for input(s): CHOL, HDL, LDLCALC, TRIG, CHOLHDL, LDLDIRECT in the last 72 hours. ?Thyroid Function Tests: ?No results for input(s): TSH, T4TOTAL, FREET4, T3FREE, THYROIDAB in the last 72 hours. ?Anemia Panel: ?No results for input(s): VITAMINB12, FOLATE, FERRITIN, TIBC, IRON, RETICCTPCT in the last 72 hours. ?Sepsis Labs: ?Recent Labs  ?Lab 01/18/22 ?1945 01/19/22 ?0330  ?LATICACIDVEN 1.8 1.3  ? ? ?Recent Results (from the past 240 hour(s))  ?Culture, Urine     Status: None  ? Collection Time: 01/16/22  1:13 PM  ? Specimen: Urine  ? BL  ?Result Value Ref Range Status  ? Urine Culture, Routine Final report  Final  ? Organism ID, Bacteria No growth  Final  ?Resp Panel by RT-PCR (Flu A&B, Covid) Nasopharyngeal Swab     Status: None  ? Collection Time: 01/18/22  8:33 PM  ? Specimen: Nasopharyngeal Swab; Nasopharyngeal(NP) swabs in vial transport medium  ?Result Value Ref Range Status  ? SARS Coronavirus 2 by RT PCR NEGATIVE NEGATIVE Final  ?  Comment: (NOTE) ?SARS-CoV-2 target nucleic acids are NOT DETECTED. ? ?The SARS-CoV-2 RNA is generally detectable in upper respiratory ?specimens during the acute phase of infection. The lowest ?concentration of SARS-CoV-2 viral copies this assay can detect is ?138 copies/mL. A negative  result does not preclude SARS-Cov-2 ?infection and should not be used as the sole basis for treatment or ?other patient management decisions. A negative result may occur with  ?improper specimen collection/handling, submission of specimen other ?than nasopharyngeal swab, presence of viral mutation(s) within the ?areas targeted by this assay, and inadequate number of viral ?copies(<138 copies/mL). A negative result must be combined with ?clinical observations, patient history, and epidemiological ?information. The expected result is Negative. ? ?Fact Sheet for Patients:  ?BloggerCourse.com ? ?Fact Sheet for Healthcare Providers:  ?SeriousBroker.it ? ?This test is no t yet approved or cleared by the [place] FDA and  ?has been authorized for detection and/or diagnosis of SARS-CoV-2 by ?FDA under an Emergency Use Authorization (EUA). This EUA will remain  ?in effect (meaning this test can be used) for the duration of the ?COVID-19 declaration under Section 564(b)(1) of the Act, 21 ?U.S.C.section 360bbb-3(b)(1), unless the authorization is terminated  ?or revoked  sooner.  ? ? ?  ? Influenza A by PCR NEGATIVE NEGATIVE Final  ? Influenza B by PCR NEGATIVE NEGATIVE Final  ?  Comment: (NOTE) ?The Xpert Xpress SARS-CoV-2/FLU/RSV plus assay is intended as an aid ?in the diagnosis of influenza from Nasopharyngeal swab specimens and ?should not be used as a sole basis for treatment. Nasal washings and ?aspirates are unacceptable for Xpert Xpress SARS-CoV-2/FLU/RSV ?testing. ? ?Fact Sheet for Patients: ?BloggerCourse.com ? ?Fact Sheet for Healthcare Providers: ?SeriousBroker.it ? ?This test is not yet approved or cleared by the [place] FDA and ?has been authorized for detection and/or diagnosis of SARS-CoV-2 by ?FDA under an Emergency Use Authorization (EUA). This EUA will remain ?in effect (meaning this test can be used)  for the duration of the ?COVID-19 declaration under Section 564(b)(1) of the Act, 21 U.S.C. ?section 360bbb-3(b)(1), unless the authorization is terminated or ?revoked. ? ?Performed at [facility]

## 2022-01-20 ENCOUNTER — Other Ambulatory Visit: Payer: Self-pay

## 2022-01-20 DIAGNOSIS — J189 Pneumonia, unspecified organism: Secondary | ICD-10-CM | POA: Diagnosis not present

## 2022-01-20 DIAGNOSIS — R400 Somnolence: Secondary | ICD-10-CM | POA: Diagnosis not present

## 2022-01-20 DIAGNOSIS — A419 Sepsis, unspecified organism: Secondary | ICD-10-CM | POA: Diagnosis not present

## 2022-01-20 DIAGNOSIS — G35 Multiple sclerosis: Secondary | ICD-10-CM | POA: Diagnosis not present

## 2022-01-20 LAB — CBC WITH DIFFERENTIAL/PLATELET
Abs Immature Granulocytes: 0.1 10*3/uL — ABNORMAL HIGH (ref 0.00–0.07)
Basophils Absolute: 0.1 10*3/uL (ref 0.0–0.1)
Basophils Relative: 1 %
Eosinophils Absolute: 0 10*3/uL (ref 0.0–0.5)
Eosinophils Relative: 0 %
HCT: 41.3 % (ref 39.0–52.0)
Hemoglobin: 14.2 g/dL (ref 13.0–17.0)
Immature Granulocytes: 1 %
Lymphocytes Relative: 2 %
Lymphs Abs: 0.3 10*3/uL — ABNORMAL LOW (ref 0.7–4.0)
MCH: 31.3 pg (ref 26.0–34.0)
MCHC: 34.4 g/dL (ref 30.0–36.0)
MCV: 91.2 fL (ref 80.0–100.0)
Monocytes Absolute: 0.4 10*3/uL (ref 0.1–1.0)
Monocytes Relative: 4 %
Neutro Abs: 9.8 10*3/uL — ABNORMAL HIGH (ref 1.7–7.7)
Neutrophils Relative %: 92 %
Platelets: 152 10*3/uL (ref 150–400)
RBC: 4.53 MIL/uL (ref 4.22–5.81)
RDW: 13.1 % (ref 11.5–15.5)
WBC: 10.6 10*3/uL — ABNORMAL HIGH (ref 4.0–10.5)
nRBC: 0 % (ref 0.0–0.2)

## 2022-01-20 LAB — COMPREHENSIVE METABOLIC PANEL
ALT: 19 U/L (ref 0–44)
AST: 44 U/L — ABNORMAL HIGH (ref 15–41)
Albumin: 2.2 g/dL — ABNORMAL LOW (ref 3.5–5.0)
Alkaline Phosphatase: 55 U/L (ref 38–126)
Anion gap: 9 (ref 5–15)
BUN: 21 mg/dL (ref 8–23)
CO2: 20 mmol/L — ABNORMAL LOW (ref 22–32)
Calcium: 8 mg/dL — ABNORMAL LOW (ref 8.9–10.3)
Chloride: 102 mmol/L (ref 98–111)
Creatinine, Ser: 0.69 mg/dL (ref 0.61–1.24)
GFR, Estimated: >60 mL/min (ref >60)
Glucose, Bld: 97 mg/dL (ref 70–99)
Potassium: 4.7 mmol/L (ref 3.5–5.1)
Sodium: 131 mmol/L — ABNORMAL LOW (ref 135–145)
Total Bilirubin: 2 mg/dL — ABNORMAL HIGH (ref 0.3–1.2)
Total Protein: 5.4 g/dL — ABNORMAL LOW (ref 6.5–8.1)

## 2022-01-20 LAB — MAGNESIUM: Magnesium: 2.2 mg/dL (ref 1.7–2.4)

## 2022-01-20 LAB — PHOSPHORUS: Phosphorus: 1.8 mg/dL — ABNORMAL LOW (ref 2.5–4.6)

## 2022-01-20 LAB — MRSA NEXT GEN BY PCR, NASAL: MRSA by PCR Next Gen: NOT DETECTED

## 2022-01-20 MED ORDER — SODIUM CHLORIDE 0.9 % IV SOLN
500.0000 mg | INTRAVENOUS | Status: DC
  Administered 2022-01-20 – 2022-01-21 (×2): 500 mg via INTRAVENOUS
  Filled 2022-01-20 (×2): qty 5

## 2022-01-20 MED ORDER — K PHOS MONO-SOD PHOS DI & MONO 155-852-130 MG PO TABS
500.0000 mg | ORAL_TABLET | Freq: Three times a day (TID) | ORAL | Status: AC
  Administered 2022-01-20 (×3): 500 mg via ORAL
  Filled 2022-01-20 (×3): qty 2

## 2022-01-20 NOTE — Care Management (Signed)
?  Transition of Care (TOC) Screening Note ? ? ?Patient Details  ?Name: Gabriel Hanson ?Date of Birth: 1951-06-01 ? ? ?Transition of Care (TOC) CM/SW Contact:    ?Graves-Bigelow, Ocie Cornfield, RN ?Phone Number: ?01/20/2022, 3:22 PM ? ? ? ?Transition of Care Department [facility]) has reviewed the patient and no TOC needs have been identified at this time. We will continue to monitor patient advancement through interdisciplinary progression rounds. If new patient transition needs arise, please place a TOC consult. ? ? ?

## 2022-01-20 NOTE — Progress Notes (Signed)
PT Cancellation Note ? ?Patient Details ?Name: Gabriel Hanson ?MRN: [id number] ?DOB: Jan 23, 1951 ? ? ?Cancelled Treatment:    Reason Eval/Treat Not Completed: Other (comment).  Pt was attempted to be seen for PT and was too tired for mobility, so will retry at another time. ? ? ?Ramond Dial ?01/20/2022, 1:57 PM ? ?Mee Hives, PT PhD ?Acute Rehab Dept. Number: [facility] [id number] and [place] [phone] ? ?

## 2022-01-20 NOTE — Progress Notes (Signed)
?   01/20/22 0807  ?Assess: MEWS Score  ?Temp 100.3 ?F (37.9 ?C)  ?BP (!) 172/95  ?Pulse Rate (!) 117  ?ECG Heart Rate (!) 116  ?Resp 20  ?O2 Device Room Air  ?Assess: MEWS Score  ?MEWS Temp 0  ?MEWS Systolic 0  ?MEWS Pulse 2  ?MEWS RR 0  ?MEWS LOC 0  ?MEWS Score 2  ?MEWS Score Color Yellow  ?Assess: if the MEWS score is Yellow or Red  ?Were vital signs taken at a resting state? Yes  ?Focused Assessment No change from prior assessment  ?Early Detection of Sepsis Score *See Row Information* Medium  ?MEWS guidelines implemented *See Row Information* No, previously yellow, continue vital signs every 4 hours  ? ? ?

## 2022-01-20 NOTE — Progress Notes (Signed)
?   01/20/22 1000  ?Chest Physiotherapy (CPT)  ?CPT Delivery Source Flutter valve  ?CPT Treatment Tolerance Tolerated fairly well  ?Incentive Spirometry  ?IS Goal (mL) (RN or RT) 1000 mL  ?IS - Achieved (mL) (RN, NT, or RT) 750 mL  ?IS - # of Times (RN or NT) 5  ?IS Effort (RN) Needs reinforcement  ? ? ?

## 2022-01-20 NOTE — Progress Notes (Signed)
Speech Language Pathology Treatment: Dysphagia  ?Patient Details ?Name: Gabriel Hanson ?MRN: [id number] ?DOB: [birth year] ?Today's Date: 01/20/2022 ?Time: 1110-1130 ?SLP Time Calculation (min) (ACUTE ONLY): 20 min ? ?Assessment / Plan / Recommendation ?Clinical Impression ? Pt seen for dysphagia tx assessing diet tolerance due to coughing with liquids noted prior date/dysphagic symptoms.  Sister present for treatment and expressed pt is "much improved" from previous date when BSE completed.  Pt interactive and agreeable to consuming regular/thin liquid consistencies despite decreased satiety.  Pt masticated regular bolus and propelled into pharynx in a timely manner without overt s/s of aspiration present throughout trial with either consistency.  Larger volumes of thin liquids given without overt s/s seen as well.  Recommend continuing current regular/thin liquid diet with encouragement for preferred foods/liquids to increase overall intake.  ST will s/o in acute setting.  ?HPI HPI: Patient is a 71 y.o. male with PMH: MS, tobacco use who presented to the ED via EMS due to AMS after his brother in law went to his house and found him on the floor lying in his urine. in ED, patient was febrile with temperature at 102.7 F, he was tachycardic, tachypneic, BP 152/81, oxygen saturations 93% on RA. CT head did not show any acute intracranial abnormalities, UTI negative. CXR showed dense airspace consolidation in the periphery of the mid and upper lung zone, concerning for PNA. RN observed that patient was intermittently gurgling and coughing after pills and drinks. BSE completed and pt placed on Regular/thin liquid diet; ST f/u for diet tolerance/education. ?  ?   ?SLP Plan ? Discharge SLP treatment due to (comment) ? ?  ?  ?Recommendations for follow up therapy are one component of a multi-disciplinary discharge planning process, led by the attending physician.  Recommendations may be updated based on patient status,  additional functional criteria and insurance authorization. ?  ? ?Recommendations  ?Diet recommendations: Regular;Thin liquid ?Liquids provided via: Straw;Cup ?Medication Administration: Whole meds with liquid ?Supervision: Patient able to self feed ?Compensations: Minimize environmental distractions;Slow rate;Small sips/bites  ?   ?    ?   ? ? ? ? Oral Care Recommendations: Oral care BID ?Follow Up Recommendations: No SLP follow up ?Assistance recommended at discharge: Intermittent Supervision/Assistance ?SLP Visit Diagnosis: Dysphagia, unspecified (R13.10) ?Plan: Discharge SLP treatment due to (comment) ? ? ? ? ?  ?  ? ? ?Elvina Sidle, M.S., CCC-SLP ? ?01/20/2022, 12:43 PM ?

## 2022-01-20 NOTE — Progress Notes (Signed)
?PROGRESS NOTE ? ? ? ?RADIN RAPTIS  RAQ:[id number] DOB: 1951-01-26 DOA: 01/18/2022 ?PCP: [name], MD  ? ? ?Brief Narrative:  ?71 year old gentleman with history of multiple sclerosis and smoker, BPH presented to the ER with altered mental status found at home by family members.  Patient does have history of multiple sclerosis and recently worsening cognitive decline.  Seen by neurologist on 4/10 and treated with high-dose steroids.  In the emergency room, temperature 102.7, tachypneic and tachycardic, blood pressure stable.  Leukocytosis, lactic acid 1.8.  Chest x-ray with dense right mid and upper lobe consolidation concerning for pneumonia.  IV fluid resuscitation, started on antibiotics and admitted to the hospital. ? ? ?Assessment & Plan: ?  ?Severe sepsis present on admission: ?Sepsis present as the patient has 2 of the following  ?Temperature 102 ?Heart rate >90 ?WBC count >12000 ,  ?And suspect his source of infections is pneumonia. ?Adequately resuscitated with IV fluid.  ?Blood cultures and sputum cultures no growth so far. ?Urine culture with more than 1000 colonies of staph hemolyticus.  Continue vancomycin and cefepime today. ?Aggressive bronchodilator therapy, respiratory therapy and mobility today. ?Seen by speech therapy, no obvious aspiration. ?Renal ultrasound with no obvious hydronephrosis or urinary retention. ? ?Acute metabolic encephalopathy in the setting of severe sepsis, underlying cognitive dysfunction: CT head was essentially normal.  No focal deficits.  Monitor.  Improving. ? ?History of multiple sclerosis: Cognitive decline.  Currently no new neurological deficits.  We will hold off on Vumerity.  He completed 600 mg prednisone for 3 days recently. ?Work with PT OT today. ? ? ?DVT prophylaxis: enoxaparin (LOVENOX) injection 40 mg Start: 01/19/22 1000 ?SCDs Start: 01/19/22 0028 ? ? ?Code Status: Full code ?Family Communication: Brother and sister at the bedside 4/13  evening. ?Disposition Plan: Status is: Inpatient ?Remains inpatient appropriate because: Treated for severe sepsis ?  ? ? ?Consultants:  ?None ? ?Procedures:  ?None ? ?Antimicrobials:  ?Vancomycin and cefepime 4/12--- ? ? ?Subjective: ? ?Patient seen and examined.  Having nagging cough.  Afebrile overnight.  Got reasonable sleep last night.  He is alert awake and interactive today.  Not mobilized yet.  Blood pressure picking up. ? ?Objective: ?Vitals:  ? 01/19/22 2320 01/20/22 0320 01/20/22 0807 01/20/22 0932  ?BP: (!) 157/73 (!) 168/94 (!) 172/95   ?Pulse: (!) 109 (!) 114 (!) 117   ?Resp: (!) 23 (!) 25 20   ?Temp: 98.4 ?F (36.9 ?C)  100.3 ?F (37.9 ?C) 98.8 ?F (37.1 ?C)  ?TempSrc: Oral  Oral   ?SpO2:      ?Weight:      ?Height:      ? ? ?Intake/Output Summary (Last 24 hours) at 01/20/2022 1125 ?Last data filed at 01/20/2022 0500 ?Gross per 24 hour  ?Intake --  ?Output 400 ml  ?Net -400 ml  ? ?Filed Weights  ? 01/18/22 1947  ?Weight: 81.6 kg  ? ? ?Examination: ? ?General: Looks fairly comfortable today on room air.  Chronically sick looking, frail and debilitated. ?Cardiovascular: S1-S2 normal.  Regular rate rhythm. ?Respiratory: Bilateral poor air entry.  He does have inspiratory and expiratory crepitations on the right mid and lower anterior lung fields. ?Gastrointestinal: Soft.  Nontender.  Bowel sound present. ?Ext: No edema or swelling.  No cyanosis. ?Neuro: Alert and oriented.  Able to have normal conversation.  Moves all extremities. ?Musculoskeletal: No deformities. ? ? ? ? ?Data Reviewed: I have personally reviewed following labs and imaging studies ? ?CBC: ?Recent Labs  ?  Lab 01/18/22 ?1945 01/19/22 ?0330 01/20/22 ?0128  ?WBC 15.9* 12.7* 10.6*  ?NEUTROABS 14.8*  --  9.8*  ?HGB 15.4 12.7* 14.2  ?HCT 45.8 37.5* 41.3  ?MCV 94.2 92.6 91.2  ?PLT 207 193 152  ? ?Basic Metabolic Panel: ?Recent Labs  ?Lab 01/18/22 ?1945 01/19/22 ?0330 01/20/22 ?0128  ?NA 135 133* 131*  ?K 3.8 3.6 4.7  ?CL 102 102 102  ?CO2 23 27 20*   ?GLUCOSE 146* 122* 97  ?BUN ?CREATININE 0.89 0.82 0.69  ?CALCIUM 8.7* 8.0* 8.0*  ?MG  --  2.1 2.2  ?PHOS  --  2.5 1.8*  ? ?GFR: ?Estimated Creatinine Clearance: 97.1 mL/min (by C-G formula based on SCr of 0.69 mg/dL). ?Liver Function Tests: ?Recent Labs  ?Lab 01/18/22 ?1945 01/19/22 ?0330 01/20/22 ?0128  ?AST 21 17 44*  ?ALT ?ALKPHOS 57 49 55  ?BILITOT 1.3* 1.2 2.0*  ?PROT 6.2* 5.4* 5.4*  ?ALBUMIN 3.0* 2.3* 2.2*  ? ?No results for input(s): LIPASE, AMYLASE in the last 168 hours. ?Recent Labs  ?Lab 01/18/22 ?2000  ?AMMONIA 21  ? ?Coagulation Profile: ?Recent Labs  ?Lab 01/18/22 ?1945  ?INR 1.1  ? ?Cardiac Enzymes: ?Recent Labs  ?Lab 01/18/22 ?1945  ?CKTOTAL 58  ? ?BNP (last 3 results) ?No results for input(s): PROBNP in the last 8760 hours. ?HbA1C: ?No results for input(s): HGBA1C in the last 72 hours. ?CBG: ?No results for input(s): GLUCAP in the last 168 hours. ?Lipid Profile: ?No results for input(s): CHOL, HDL, LDLCALC, TRIG, CHOLHDL, LDLDIRECT in the last 72 hours. ?Thyroid Function Tests: ?No results for input(s): TSH, T4TOTAL, FREET4, T3FREE, THYROIDAB in the last 72 hours. ?Anemia Panel: ?No results for input(s): VITAMINB12, FOLATE, FERRITIN, TIBC, IRON, RETICCTPCT in the last 72 hours. ?Sepsis Labs: ?Recent Labs  ?Lab 01/18/22 ?1945 01/19/22 ?0330  ?LATICACIDVEN 1.8 1.3  ? ? ?Recent Results (from the past 240 hour(s))  ?Culture, Urine     Status: None  ? Collection Time: 01/16/22  1:13 PM  ? Specimen: Urine  ? BL  ?Result Value Ref Range Status  ? Urine Culture, Routine Final report  Final  ? Organism ID, Bacteria No growth  Final  ?Resp Panel by RT-PCR (Flu A&B, Covid) Nasopharyngeal Swab     Status: None  ? Collection Time: 01/18/22  8:33 PM  ? Specimen: Nasopharyngeal Swab; Nasopharyngeal(NP) swabs in vial transport medium  ?Result Value Ref Range Status  ? SARS Coronavirus 2 by RT PCR NEGATIVE NEGATIVE Final  ?  Comment: (NOTE) ?SARS-CoV-2 target nucleic acids are NOT  DETECTED. ? ?The SARS-CoV-2 RNA is generally detectable in upper respiratory ?specimens during the acute phase of infection. The lowest ?concentration of SARS-CoV-2 viral copies this assay can detect is ?138 copies/mL. A negative result does not preclude SARS-Cov-2 ?infection and should not be used as the sole basis for treatment or ?other patient management decisions. A negative result may occur with  ?improper specimen collection/handling, submission of specimen other ?than nasopharyngeal swab, presence of viral mutation(s) within the ?areas targeted by this assay, and inadequate number of viral ?copies(<138 copies/mL). A negative result must be combined with ?clinical observations, patient history, and epidemiological ?information. The expected result is Negative. ? ?Fact Sheet for Patients:  ?BloggerCourse.com ? ?Fact Sheet for Healthcare Providers:  ?SeriousBroker.it ? ?This test is no t yet approved or cleared by the [place] FDA and  ?has been authorized for detection and/or diagnosis of SARS-CoV-2 by ?FDA under an Emergency Use Authorization (  EUA). This EUA will remain  ?in effect (meaning this test can be used) for the duration of the ?COVID-19 declaration under Section 564(b)(1) of the Act, 21 ?U.S.C.section 360bbb-3(b)(1), unless the authorization is terminated  ?or revoked sooner.  ? ? ?  ? Influenza A by PCR NEGATIVE NEGATIVE Final  ? Influenza B by PCR NEGATIVE NEGATIVE Final  ?  Comment: (NOTE) ?The Xpert Xpress SARS-CoV-2/FLU/RSV plus assay is intended as an aid ?in the diagnosis of influenza from Nasopharyngeal swab specimens and ?should not be used as a sole basis for treatment. Nasal washings and ?aspirates are unacceptable for Xpert Xpress SARS-CoV-2/FLU/RSV ?testing. ? ?Fact Sheet for Patients: ?BloggerCourse.com ? ?Fact Sheet for Healthcare Providers: ?SeriousBroker.it ? ?This test is not yet  approved or cleared by the [place] FDA and ?has been authorized for detection and/or diagnosis of SARS-CoV-2 by ?FDA under an Emergency Use Authorization (EUA). This EUA will remain ?in effect (meaning this test can be used

## 2022-01-21 ENCOUNTER — Inpatient Hospital Stay (HOSPITAL_COMMUNITY): Payer: Medicare Other

## 2022-01-21 DIAGNOSIS — R400 Somnolence: Secondary | ICD-10-CM | POA: Diagnosis not present

## 2022-01-21 DIAGNOSIS — J189 Pneumonia, unspecified organism: Secondary | ICD-10-CM | POA: Diagnosis not present

## 2022-01-21 DIAGNOSIS — G35 Multiple sclerosis: Secondary | ICD-10-CM | POA: Diagnosis not present

## 2022-01-21 DIAGNOSIS — A419 Sepsis, unspecified organism: Secondary | ICD-10-CM | POA: Diagnosis not present

## 2022-01-21 LAB — CBC WITH DIFFERENTIAL/PLATELET
Abs Immature Granulocytes: 0.11 10*3/uL — ABNORMAL HIGH (ref 0.00–0.07)
Basophils Absolute: 0 10*3/uL (ref 0.0–0.1)
Basophils Relative: 0 %
Eosinophils Absolute: 0 10*3/uL (ref 0.0–0.5)
Eosinophils Relative: 0 %
HCT: 33.6 % — ABNORMAL LOW (ref 39.0–52.0)
Hemoglobin: 12 g/dL — ABNORMAL LOW (ref 13.0–17.0)
Immature Granulocytes: 1 %
Lymphocytes Relative: 4 %
Lymphs Abs: 0.5 10*3/uL — ABNORMAL LOW (ref 0.7–4.0)
MCH: 31.4 pg (ref 26.0–34.0)
MCHC: 35.7 g/dL (ref 30.0–36.0)
MCV: 88 fL (ref 80.0–100.0)
Monocytes Absolute: 0.5 10*3/uL (ref 0.1–1.0)
Monocytes Relative: 4 %
Neutro Abs: 12 10*3/uL — ABNORMAL HIGH (ref 1.7–7.7)
Neutrophils Relative %: 91 %
Platelets: 199 10*3/uL (ref 150–400)
RBC: 3.82 MIL/uL — ABNORMAL LOW (ref 4.22–5.81)
RDW: 12.9 % (ref 11.5–15.5)
WBC: 13.2 10*3/uL — ABNORMAL HIGH (ref 4.0–10.5)
nRBC: 0 % (ref 0.0–0.2)

## 2022-01-21 LAB — URINE CULTURE: Culture: >=100000 — AB

## 2022-01-21 LAB — COMPREHENSIVE METABOLIC PANEL
ALT: 30 U/L (ref 0–44)
AST: 52 U/L — ABNORMAL HIGH (ref 15–41)
Albumin: 1.9 g/dL — ABNORMAL LOW (ref 3.5–5.0)
Alkaline Phosphatase: 53 U/L (ref 38–126)
Anion gap: 7 (ref 5–15)
BUN: 17 mg/dL (ref 8–23)
CO2: 24 mmol/L (ref 22–32)
Calcium: 7.7 mg/dL — ABNORMAL LOW (ref 8.9–10.3)
Chloride: 100 mmol/L (ref 98–111)
Creatinine, Ser: 0.77 mg/dL (ref 0.61–1.24)
GFR, Estimated: >60 mL/min (ref >60)
Glucose, Bld: 113 mg/dL — ABNORMAL HIGH (ref 70–99)
Potassium: 3 mmol/L — ABNORMAL LOW (ref 3.5–5.1)
Sodium: 131 mmol/L — ABNORMAL LOW (ref 135–145)
Total Bilirubin: 0.7 mg/dL (ref 0.3–1.2)
Total Protein: 5 g/dL — ABNORMAL LOW (ref 6.5–8.1)

## 2022-01-21 MED ORDER — SODIUM CHLORIDE 0.9 % IV SOLN
2.0000 g | INTRAVENOUS | Status: DC
  Administered 2022-01-21 – 2022-01-23 (×3): 2 g via INTRAVENOUS
  Filled 2022-01-21 (×3): qty 20

## 2022-01-21 MED ORDER — POTASSIUM CHLORIDE 20 MEQ PO PACK
40.0000 meq | PACK | Freq: Two times a day (BID) | ORAL | Status: AC
  Administered 2022-01-21 – 2022-01-22 (×4): 40 meq via ORAL
  Filled 2022-01-21 (×4): qty 2

## 2022-01-21 NOTE — Progress Notes (Signed)
?PROGRESS NOTE ? ? ? ?Gabriel Hanson  [id number] DOB: [birth year] DOA: 01/18/2022 ?PCP: [name], MD  ? ? ?Brief Narrative:  ?72 year old gentleman with history of multiple sclerosis and smoker, BPH presented to the ER with altered mental status found at home by family members.  Patient does have history of multiple sclerosis and recently worsening cognitive decline.  Seen by neurologist on 4/10 and treated with high-dose steroids.  In the emergency room, temperature 102.7, tachypneic and tachycardic, blood pressure stable.  Leukocytosis, lactic acid 1.8.  Chest x-ray with dense right mid and upper lobe consolidation concerning for pneumonia.  IV fluid resuscitation, started on antibiotics and admitted to the hospital. ?Urine Legionella antigen positive.  Urine cultures positive for staph hemolyticus. ? ? ?Assessment & Plan: ?  ?Severe sepsis present on admission due to pneumonia.  Urine Legionella antigen positive.  Immunocompromised with high-dose steroids. ?Clinically stabilizing. ?Presentation CT scan with large consolidative pneumonia right middle lobe. ?Repeat chest x-ray today 4/15 with improvement of consolidation but extension of infiltrates. ?Received vancomycin and cefepime for 2 days, started on azithromycin 4/14. ?We will continue azithromycin for total 7 days. ?Aggressive bronchodilator therapy, respiratory therapy and mobility today. ?Seen by speech therapy, no obvious aspiration. ? ?Acute UTI present on admission, symptomatic with dysuria.  Growing a staph hemolyticus.  Blood cultures negative. Renal ultrasound with no obvious hydronephrosis or urinary retention.  Currently on vancomycin. ?We will change to oral antibiotics and treat for total 7 days. ? ?Acute metabolic encephalopathy in the setting of severe sepsis, underlying cognitive dysfunction: CT head was essentially normal.  No focal deficits.  Monitor.  Improving to his baseline. ? ?Hypokalemia/hypophosphatemia:  Replace. ? ?History of multiple sclerosis: Cognitive decline.  Currently no new neurological deficits.  ?He completed 600 mg prednisone for 3 days recently. ?Vumerity at home.  Transient transaminitis probably related to sepsis.  We will recheck LFTs tomorrow, if normalizing, will resume Vumerity. ? ?PT/OT/MedSurg bed.  Look for a SNF. ? ? ?DVT prophylaxis: enoxaparin (LOVENOX) injection 40 mg Start: 01/19/22 1000 ?SCDs Start: 01/19/22 0028 ? ? ?Code Status: Full code ?Family Communication: Sister Silva Bandy on phone. ?Disposition Plan: Status is: Inpatient ?Remains inpatient appropriate because: Treated for severe sepsis ?  ? ? ?Consultants:  ?None ? ?Procedures:  ?None ? ?Antimicrobials:  ?Vancomycin and cefepime 4/12--- ?Azithromycin 4/14- ? ? ?Subjective: ? ?Patient seen and examined.  Now able to expectorate some sputum.  Denied any overnight issues.  Afebrile now.  Mildly tachycardic.  Mostly on room air at rest.  More alert and interactive today. ? ?Objective: ?Vitals:  ? 01/20/22 2126 01/21/22 0345 01/21/22 0743 01/21/22 1056  ?BP: (!) 169/88 (!) 159/77  (!) 150/81  ?Pulse: (!) 108 94 (!) 103 (!) 118  ?Resp: 20 20 20    ?Temp: 98.3 ?F (36.8 ?C) 98.9 ?F (37.2 ?C)    ?TempSrc: Oral Axillary    ?SpO2: 92% 90% 91% 93%  ?Weight:      ?Height:      ? ? ?Intake/Output Summary (Last 24 hours) at 01/21/2022 1153 ?Last data filed at 01/21/2022 0300 ?Gross per 24 hour  ?Intake 1508.35 ml  ?Output --  ?Net 1508.35 ml  ? ?Filed Weights  ? 01/18/22 1947  ?Weight: 81.6 kg  ? ? ?Examination: ? ?General: Looks fairly comfortable on room air at rest. ?Cardiovascular: S1-S2 normal.  Regular rate rhythm.  Tachycardic. ?Respiratory: Extensive crackles on the right side.  Good air entry on the left. ?Gastrointestinal: Soft.  Nontender. ?Ext: No  swelling or edema.  No cyanosis. ?Neuro: Intact. ?Musculoskeletal: No deformities. ?Skin: Intact. ? ? ? ? ? ?Data Reviewed: I have personally reviewed following labs and imaging  studies ? ?CBC: ?Recent Labs  ?Lab 01/18/22 ?1945 01/19/22 ?0330 01/20/22 ?0128 01/21/22 ?0013  ?WBC 15.9* 12.7* 10.6* 13.2*  ?NEUTROABS 14.8*  --  9.8* 12.0*  ?HGB 15.4 12.7* 14.2 12.0*  ?HCT 45.8 37.5* 41.3 33.6*  ?MCV 94.2 92.6 91.2 88.0  ?PLT 207 193 152 199  ? ?Basic Metabolic Panel: ?Recent Labs  ?Lab 01/18/22 ?1945 01/19/22 ?0330 01/20/22 ?0128 01/21/22 ?0013  ?NA 135 133* 131* 131*  ?K 3.8 3.6 4.7 3.0*  ?CL 102 102 102 100  ?CO2 23 27 20* 24  ?GLUCOSE 146* 122* 97 113*  ?BUN 22 19 21 17   ?CREATININE 0.89 0.82 0.69 0.77  ?CALCIUM 8.7* 8.0* 8.0* 7.7*  ?MG  --  2.1 2.2  --   ?PHOS  --  2.5 1.8*  --   ? ?GFR: ?Estimated Creatinine Clearance: 97.1 mL/min (by C-G formula based on SCr of 0.77 mg/dL). ?Liver Function Tests: ?Recent Labs  ?Lab 01/18/22 ?1945 01/19/22 ?0330 01/20/22 ?0128 01/21/22 ?0013  ?AST 21 17 44* 52*  ?ALT 18 15 19 30   ?ALKPHOS 57 49 55 53  ?BILITOT 1.3* 1.2 2.0* 0.7  ?PROT 6.2* 5.4* 5.4* 5.0*  ?ALBUMIN 3.0* 2.3* 2.2* 1.9*  ? ?No results for input(s): LIPASE, AMYLASE in the last 168 hours. ?Recent Labs  ?Lab 01/18/22 ?2000  ?AMMONIA 21  ? ?Coagulation Profile: ?Recent Labs  ?Lab 01/18/22 ?1945  ?INR 1.1  ? ?Cardiac Enzymes: ?Recent Labs  ?Lab 01/18/22 ?1945  ?CKTOTAL 58  ? ?BNP (last 3 results) ?No results for input(s): PROBNP in the last 8760 hours. ?HbA1C: ?No results for input(s): HGBA1C in the last 72 hours. ?CBG: ?No results for input(s): GLUCAP in the last 168 hours. ?Lipid Profile: ?No results for input(s): CHOL, HDL, LDLCALC, TRIG, CHOLHDL, LDLDIRECT in the last 72 hours. ?Thyroid Function Tests: ?No results for input(s): TSH, T4TOTAL, FREET4, T3FREE, THYROIDAB in the last 72 hours. ?Anemia Panel: ?No results for input(s): VITAMINB12, FOLATE, FERRITIN, TIBC, IRON, RETICCTPCT in the last 72 hours. ?Sepsis Labs: ?Recent Labs  ?Lab 01/18/22 ?1945 01/19/22 ?0330  ?LATICACIDVEN 1.8 1.3  ? ? ?Recent Results (from the past 240 hour(s))  ?Culture, Urine     Status: None  ? Collection Time:  01/16/22  1:13 PM  ? Specimen: Urine  ? BL  ?Result Value Ref Range Status  ? Urine Culture, Routine Final report  Final  ? Organism ID, Bacteria No growth  Final  ?Resp Panel by RT-PCR (Flu A&B, Covid) Nasopharyngeal Swab     Status: None  ? Collection Time: 01/18/22  8:33 PM  ? Specimen: Nasopharyngeal Swab; Nasopharyngeal(NP) swabs in vial transport medium  ?Result Value Ref Range Status  ? SARS Coronavirus 2 by RT PCR NEGATIVE NEGATIVE Final  ?  Comment: (NOTE) ?SARS-CoV-2 target nucleic acids are NOT DETECTED. ? ?The SARS-CoV-2 RNA is generally detectable in upper respiratory ?specimens during the acute phase of infection. The lowest ?concentration of SARS-CoV-2 viral copies this assay can detect is ?138 copies/mL. A negative result does not preclude SARS-Cov-2 ?infection and should not be used as the sole basis for treatment or ?other patient management decisions. A negative result may occur with  ?improper specimen collection/handling, submission of specimen other ?than nasopharyngeal swab, presence of viral mutation(s) within the ?areas targeted by this assay, and inadequate number of viral ?copies(<138 copies/mL). A  negative result must be combined with ?clinical observations, patient history, and epidemiological ?information. The expected result is Negative. ? ?Fact Sheet for Patients:  ?EntrepreneurPulse.com.au ? ?Fact Sheet for Healthcare Providers:  ?IncredibleEmployment.be ? ?This test is no t yet approved or cleared by the [place] FDA and  ?has been authorized for detection and/or diagnosis of SARS-CoV-2 by ?FDA under an Emergency Use Authorization (EUA). This EUA will remain  ?in effect (meaning this test can be used) for the duration of the ?COVID-19 declaration under Section 564(b)(1) of the Act, 21 ?U.S.C.section 360bbb-3(b)(1), unless the authorization is terminated  ?or revoked sooner.  ? ? ?  ? Influenza A by PCR NEGATIVE NEGATIVE Final  ? Influenza B by  PCR NEGATIVE NEGATIVE Final  ?  Comment: (NOTE) ?The Xpert Xpress SARS-CoV-2/FLU/RSV plus assay is intended as an aid ?in the diagnosis of influenza from Nasopharyngeal swab specimens and ?should not be used as a sole basis for treatment.

## 2022-01-21 NOTE — TOC Initial Note (Signed)
Transition of Care (TOC) - Initial/Assessment Note  ? ? ?Patient Details  ?Name: Gabriel Hanson ?MRN: [id number] ?Date of Birth: July 13, 1951 ? ?Transition of Care (TOC) CM/SW Contact:    ?Emeterio Reeve, LCSW ?Phone Number: ?01/21/2022, 3:28 PM ? ?Clinical Narrative:                 ? ?CSW received SNF consult. CSW met with pt at bedside. CSW introduced self and explained role at the hospital. Pt reports that PTA he lives at home alone. Pt was independent with mobility and ADL's. Pt did use a cane.  ? ?CSW reviewed PT/OT recommendations for SNF. Pt reports he is fine with SNF. Pt gave CSW permission to fax out to facilities in the area. Pt has no preference of facility at this time. CSW gave pt medicare.gov rating list to review. CSW explained insurance auth process.  ? ?CSW will continue to follow. ? ? ?Expected Discharge Plan: [place] ?Barriers to Discharge: Continued Medical Work up ? ? ?Patient Goals and CMS Choice ?Patient states their goals for this hospitalization and ongoing recovery are:: get stronger with SNF then go home ?CMS Medicare.gov Compare Post Acute Care list provided to:: Patient ?Choice offered to / list presented to : Patient ? ?Expected Discharge Plan and Services ?Expected Discharge Plan: [place] ?  ?  ?  ?Living arrangements for the past 2 months: [place] ?                ?  ?  ?  ?  ?  ?  ?  ?  ?  ?  ? ?Prior Living Arrangements/Services ?Living arrangements for the past 2 months: [place] ?Lives with:: Self ?Patient language and need for interpreter reviewed:: Yes ?Do you feel safe going back to the place where you live?: Yes      ?Need for Family Participation in Patient Care: Yes (Comment) ?Care giver support system in place?: Yes (comment) ?Current home services: DME ?Criminal Activity/Legal Involvement Pertinent to Current Situation/Hospitalization: No - Comment as needed ? ?Activities of Daily Living ?Home Assistive  Devices/Equipment: None ?ADL Screening (condition at time of admission) ?Patient's cognitive ability adequate to safely complete daily activities?: No ?Is the patient deaf or have difficulty hearing?: No ?Does the patient have difficulty seeing, even when wearing glasses/contacts?: No ?Does the patient have difficulty concentrating, remembering, or making decisions?: Yes ?Patient able to express need for assistance with ADLs?: No ?Does the patient have difficulty dressing or bathing?: Yes ?Independently performs ADLs?: No ?Communication: Needs assistance ?Is this a change from baseline?: Change from baseline, expected to last >3 days ?Dressing (OT): Needs assistance ?Is this a change from baseline?: Change from baseline, expected to last >3 days ?Grooming: Needs assistance ?Is this a change from baseline?: Change from baseline, expected to last >3 days ?Feeding: Needs assistance ?Is this a change from baseline?: Change from baseline, expected to last >3 days ?Bathing: Needs assistance ?Is this a change from baseline?: Change from baseline, expected to last >3 days ?Toileting: Needs assistance ?Is this a change from baseline?: Change from baseline, expected to last >3days ?In/Out Bed: Needs assistance ?Is this a change from baseline?: Change from baseline, expected to last >3 days ?Walks in Home: Needs assistance ?Is this a change from baseline?: Change from baseline, expected to last >3 days ?Does the patient have difficulty walking or climbing stairs?: Yes ?Weakness of Legs: Both ?Weakness of Arms/Hands: None ? ?Permission Sought/Granted ?Permission sought  to share information with : Family Supports, Customer service manager ?Permission granted to share information with : Yes, Verbal Permission Granted ?   ? Permission granted to share info w AGENCY: SNF ?   ?   ? ?Emotional Assessment ?Appearance:: Appears stated age ?Attitude/Demeanor/Rapport: Engaged ?Affect (typically observed): Appropriate ?Orientation:  : Oriented to Self, Oriented to Place, Oriented to  Time, Oriented to Situation ?Alcohol / Substance Use: Not Applicable ?Psych Involvement: No (comment) ? ?Admission diagnosis:  CAP (community acquired pneumonia) [J18.9] ?Community acquired pneumonia of right lung, unspecified part of lung [J18.9] ?Sepsis, due to unspecified organism, unspecified whether acute organ dysfunction present ([place]) [A41.9] ?Patient Active Problem List  ? Diagnosis Date Noted  ? Altered mental status 01/19/2022  ? Leukocytosis 01/19/2022  ? Hypoalbuminemia due to protein-calorie malnutrition ([place]) 01/19/2022  ? Hyperglycemia 01/19/2022  ? Dehydration 01/19/2022  ? Sepsis ([place]) 01/19/2022  ? CAP (community acquired pneumonia) 01/18/2022  ? Attention deficit 12/17/2017  ? Numbness 05/17/2015  ? Gait disturbance 05/17/2015  ? High risk medication use 01/15/2015  ? Urinary hesitancy 01/15/2015  ? Cognitive dysfunction 03/18/2010  ? Erectile dysfunction 03/18/2010  ? Depression 03/18/2010  ? Multiple sclerosis ([place]) 03/18/2010  ? UNSPECIFIED DISORDER OF EYE 03/18/2010  ? Constipation 03/18/2010  ? RECTAL BLEEDING 03/18/2010  ? PSORIASIS 03/18/2010  ? WEIGHT LOSS 03/18/2010  ? ?PCP:  [name], MD ?Pharmacy:   ?[facility] - [place], [place] - [address] ?[phone] [address] ?[address] ?[place] [address] ?Phone: [phone] Fax: [phone] ? ?[id number] [id number]?Phone: [phone]: [phone]? ?[facility] [place] [facility] [address]?Phone: [phone]: [phone]? ? ? ? ?Social Determinants of Health (SDOH) Interventions ?  ? ?Readmission Risk Interventions ?   ? View : No data to display.  ?  ?  ?  ? ? ? ?

## 2022-01-21 NOTE — Evaluation (Signed)
Occupational Therapy Evaluation Patient Details Name: Gabriel Hanson MRN: [id number] DOB: 05-Jul-1951 Today's Date: 01/21/2022   History of Present Illness 71 y.o. male who presents to the emergency department via EMS 01/18/22 due to altered mental status. +pna; CT head no acute findings;   PMH significant of MS, depression, and tobacco use   Clinical Impression   Pt presents with decline in function and safety with ADLs and ADL mobility with impaired strength, balance, endurance and cognition as well as poor insight into current medical status. PTA, pt lived alone in an apartment, Ind with ADLs/selfcare, light home mgt, used a cane fot mobility; his friend/neighbor assists with transportation. Pt currently requires min A with UB ADLs, max A with LB ADLs, mod - min A with mobility using RW. Pt also confused/STM impaired. Pt would benefit from skilled OT services to address impairments to maximize level of function and safety     Recommendations for follow up therapy are one component of a multi-disciplinary discharge planning process, led by the attending physician.  Recommendations may be updated based on patient status, additional functional criteria and insurance authorization.   Follow Up Recommendations  Skilled nursing-short term rehab (<3 hours/day)    Assistance Recommended at Discharge Frequent or constant Supervision/Assistance  Patient can return home with the following A lot of help with bathing/dressing/bathroom;Two people to help with walking and/or transfers;Assistance with cooking/housework;Direct supervision/assist for medications management;Assist for transportation;Direct supervision/assist for financial management    Functional Status Assessment  Patient has had a recent decline in their functional status and demonstrates the ability to make significant improvements in function in a reasonable and predictable amount of time.  Equipment Recommendations  Other (comment) (TBD  at next venue of care)    Recommendations for Other Services       Precautions / Restrictions Precautions Precautions: Fall Precaution Comments: reports near falls PTA Restrictions Weight Bearing Restrictions: No      Mobility Bed Mobility               General bed mobility comments: pt in recliner upon arrival    Transfers Overall transfer level: Needs assistance Equipment used: Rolling walker (2 wheels) Transfers: Sit to/from Stand, Bed to chair/wheelchair/BSC Sit to Stand: Mod assist, Min assist     Step pivot transfers: Min assist     General transfer comment: mod A initial sit - stand from recliner, min A from EOB back to recliner, mod verbal and physical cues for hand/foot placement      Balance Overall balance assessment: Needs assistance Sitting-balance support: No upper extremity supported, Feet supported Sitting balance-Leahy Scale: Fair     Standing balance support: Bilateral upper extremity supported, Reliant on assistive device for balance Standing balance-Leahy Scale: Poor                             ADL either performed or assessed with clinical judgement   ADL Overall ADL's : Needs assistance/impaired Eating/Feeding: Supervision/ safety;Sitting   Grooming: Wash/dry hands;Wash/dry face;Min guard;Sitting   Upper Body Bathing: Minimal assistance;Sitting   Lower Body Bathing: Maximal assistance   Upper Body Dressing : Minimal assistance;Sitting   Lower Body Dressing: Maximal assistance   Toilet Transfer: Moderate assistance;Minimal assistance;Stand-pivot;Rolling walker (2 wheels);Cueing for safety;Cueing for sequencing   Toileting- Clothing Manipulation and Hygiene: Maximal assistance;Sit to/from stand       Functional mobility during ADLs: Moderate assistance;Minimal assistance;Rolling walker (2 wheels);Cueing for safety;Cueing for sequencing  Vision Baseline Vision/History: 1 Wears glasses Ability to See in  Adequate Light: 0 Adequate Patient Visual Report: No change from baseline       Perception     Praxis      Pertinent Vitals/Pain Pain Assessment Pain Assessment: No/denies pain     Hand Dominance Right   Extremity/Trunk Assessment Upper Extremity Assessment Upper Extremity Assessment: Generalized weakness   Lower Extremity Assessment Lower Extremity Assessment: Defer to PT evaluation RLE Deficits / Details: unable to fully extend knees in standing; reports torn meniscus; grossly 3+/5 LLE Deficits / Details: unable to fully extend knee in standing; grossly 3+/5   Cervical / Trunk Assessment Cervical / Trunk Assessment: Kyphotic   Communication Communication Communication: No difficulties   Cognition Arousal/Alertness: Awake/alert Behavior During Therapy: WFL for tasks assessed/performed Overall Cognitive Status: Impaired/Different from baseline Area of Impairment: Orientation, Attention, Memory, Following commands, Problem solving                 Orientation Level: Place, Disoriented to, Situation   Memory: Decreased recall of precautions, Decreased short-term memory Following Commands: Follows one step commands with increased time     Problem Solving: Slow processing, Difficulty sequencing, Requires verbal cues, Requires tactile cues General Comments: great difficulty following commands for safe use of RW during transfer; much repetition required for hand placement     General Comments  HR 90-100s; O2 probe not working    Exercises     Shoulder Instructions      Home Living Family/patient expects to be discharged to:: Private residence Living Arrangements: Alone Available Help at Discharge: Friend(s) Type of Home: Apartment Home Access: Level entry Entrance Stairs-Number of Steps: pt reports that he lives on first floor with level entry, however reported earlier to PT that he has STE Entrance Stairs-Rails: Right Home Layout: One level     Bathroom  Shower/Tub: IT trainer: Handicapped height Bathroom Accessibility: No   Home Equipment: Cane - single point          Prior Functioning/Environment Prior Level of Function : Needs assist       Physical Assist : ADLs (physical)   ADLs (physical): IADLs Mobility Comments: using cane; has been stumbling but not falling ADLs Comments: pt reports that he was Ind with ADLs/selfcare and that a friend drives him to grocery store and he walked with cart        OT Problem List: Decreased strength;Impaired balance (sitting and/or standing);Decreased cognition;Decreased safety awareness;Decreased activity tolerance;Decreased knowledge of use of DME or AE;Decreased coordination      OT Treatment/Interventions: Self-care/ADL training;Patient/family education;Balance training;Neuromuscular education;Therapeutic activities;DME and/or AE instruction    OT Goals(Current goals can be found in the care plan section) Acute Rehab OT Goals Patient Stated Goal: none stated OT Goal Formulation: With patient Time For Goal Achievement: 02/04/22 Potential to Achieve Goals: Good ADL Goals Pt Will Perform Grooming: with min assist;with min guard assist;standing Pt Will Perform Upper Body Bathing: with min guard assist;with supervision;sitting Pt Will Perform Lower Body Bathing: with mod assist Pt Will Perform Upper Body Dressing: with min guard assist;with supervision;sitting Pt Will Perform Lower Body Dressing: with mod assist Pt Will Transfer to Toilet: with min assist;with min guard assist;ambulating;grab bars Pt Will Perform Toileting - Clothing Manipulation and hygiene: with mod assist;sit to/from stand  OT Frequency: Min 2X/week    Co-evaluation              AM-PAC OT "6 Clicks" Daily Activity  Outcome Measure Help from another person eating meals?: None Help from another person taking care of personal grooming?: A Little Help from another person  toileting, which includes using toliet, bedpan, or urinal?: A Lot Help from another person bathing (including washing, rinsing, drying)?: A Lot Help from another person to put on and taking off regular upper body clothing?: A Little Help from another person to put on and taking off regular lower body clothing?: A Lot 6 Click Score: 16   End of Session Equipment Utilized During Treatment: Gait belt;Rolling walker (2 wheels)  Activity Tolerance: Patient limited by fatigue Patient left: in chair;with call bell/phone within reach;with chair alarm set  OT Visit Diagnosis: Unsteadiness on feet (R26.81);Other abnormalities of gait and mobility (R26.89);Muscle weakness (generalized) (M62.81);Other symptoms and signs involving cognitive function                Time: 1009-1033 OT Time Calculation (min): 24 min Charges:  OT General Charges $OT Visit: 1 Visit OT Evaluation $OT Eval Moderate Complexity: 1 Mod OT Treatments $Therapeutic Activity: 8-22 mins    Galen [place] 01/21/2022, 11:21 AM

## 2022-01-21 NOTE — Progress Notes (Signed)
Physical Therapy Evaluation ?Patient Details ?Name: Gabriel Hanson ?MRN: [id number] ?DOB: 1951/02/23 ?Today's Date: 01/21/2022 ? ?History of Present Illness ? 71 y.o. male who presents to the emergency department via EMS 01/18/22 due to altered mental status. +pna; CT head no acute findings;   PMH significant of MS, depression, and tobacco use  ?Clinical Impression ?  ?Pt admitted secondary to problem above with deficits below. PTA patient was living alone using a cane and reports recent near falls. He has assist with driving to appointments and errands. Pt currently requires 2 person assist for ambulation for safety due to weak legs and crouched posture. Transfers bed to chair with rW and min assist.  Anticipate patient will benefit from PT to address problems listed below.Will continue to follow acutely to maximize functional mobility independence and safety.   ?   ?   ? ?Recommendations for follow up therapy are one component of a multi-disciplinary discharge planning process, led by the attending physician.  Recommendations may be updated based on patient status, additional functional criteria and insurance authorization. ? ?Follow Up Recommendations Acute inpatient rehab (3hours/day) ? ?  ?Assistance Recommended at Discharge Frequent or constant Supervision/Assistance  ?Patient can return home with the following ? A lot of help with walking and/or transfers;A lot of help with bathing/dressing/bathroom;Direct supervision/assist for medications management;Direct supervision/assist for financial management;Help with stairs or ramp for entrance ? ?  ?Equipment Recommendations Rolling walker (2 wheels)  ?Recommendations for Other Services ? OT consult;Rehab consult  ?  ?Functional Status Assessment Patient has had a recent decline in their functional status and demonstrates the ability to make significant improvements in function in a reasonable and predictable amount of time.  ? ?  ?Precautions / Restrictions  Precautions ?Precautions: Fall ?Precaution Comments: rreports near falls PTA  ? ?  ? ?Mobility ? Bed Mobility ?Overal bed mobility: Needs Assistance ?Bed Mobility: Supine to Sit ?  ?  ?Supine to sit: Supervision ?  ?  ?General bed mobility comments: for safety; pt did wait for lines to be arranged and PT ready prior to attempting sit to stand ?  ? ?Transfers ?Overall transfer level: Needs assistance ?Equipment used: Rolling walker (2 wheels) ?Transfers: Sit to/from Stand, Bed to chair/wheelchair/BSC ?Sit to Stand: Min assist ?  ?Step pivot transfers: Min assist ?  ?  ?  ?General transfer comment: ~10 small steps from bed to recliner with crouched knees; assist to maneuver RW ?  ? ?Ambulation/Gait ?  ?  ?  ?  ?  ?  ?Pre-gait activities: stepping in place with poor foot clearance and knees flexed; decent use of UEs for support via RW ?General Gait Details: deferred due to safety concerns ? ?Stairs ?  ?  ?  ?  ?  ? ?Wheelchair Mobility ?  ? ?Modified Rankin (Stroke Patients Only) ?  ? ?  ? ?Balance Overall balance assessment: Needs assistance ?Sitting-balance support: No upper extremity supported, Feet supported ?Sitting balance-Leahy Scale: Fair ?Sitting balance - Comments: >fair nt ?  ?Standing balance support: Bilateral upper extremity supported, Reliant on assistive device for balance ?Standing balance-Leahy Scale: Poor ?  ?  ?  ?  ?  ?  ?  ?  ?  ?  ?  ?  ?   ? ? ? ?Pertinent Vitals/Pain Pain Assessment ?Pain Assessment: No/denies pain  ? ? ?Home Living Family/patient expects to be discharged to:: Unsure ?Living Arrangements: Alone ?Available Help at Discharge: Family (sister; ex-brother in law) ?Type of Home: Apartment ?  Home Access: Stairs to enter ?Entrance Stairs-Rails: Right ?Entrance Stairs-Number of Steps: 2 ?  ?Home Layout: One level ?Home Equipment: Gil[place] Mor - single point ?   ?  ?Prior Function Prior Level of Function : Needs assist ?  ?  ?  ?Physical Assist : ADLs (physical) ?  ?ADLs (physical):  IADLs ?Mobility Comments: using cane; has been stumbling but not falling ?ADLs Comments: friend drives him to grocery store and he walked with cart ?  ? ? ?Hand Dominance  ?   ? ?  ?Extremity/Trunk Assessment  ? Upper Extremity Assessment ?Upper Extremity Assessment: Defer to OT evaluation ?  ? ?Lower Extremity Assessment ?Lower Extremity Assessment: Generalized weakness;RLE deficits/detail;LLE deficits/detail ?RLE Deficits / Details: unable to fully extend knees in standing; reports torn meniscus; grossly 3+/5 ?LLE Deficits / Details: unable to fully extend knee in standing; grossly 3+/5 ?  ? ?Cervical / Trunk Assessment ?Cervical / Trunk Assessment: Kyphotic  ?Communication  ? Communication: No difficulties  ?Cognition Arousal/Alertness: Awake/alert ?Behavior During Therapy: [facility] for tasks assessed/performed ?Overall Cognitive Status: Impaired/Different from baseline ?Area of Impairment: Orientation, Attention, Memory, Following commands, Problem solving ?  ?  ?  ?  ?  ?  ?  ?  ?Orientation Level: Place, Time (knew the month, not day) ?Current Attention Level: Sustained ?Memory: Decreased recall of precautions, Decreased short-term memory (difficulty recalling if he has steps at home) ?Following Commands: Follows one step commands with increased time (and repetition) ?  ?  ?Problem Solving: Slow processing, Difficulty sequencing, Requires verbal cues, Requires tactile cues ?General Comments: great difficulty following commands for safe use of RW during transfer; much repetition required for hand placement ?  ?  ? ?  ?General Comments General comments (skin integrity, edema, etc.): HR 90-100s; O2 probe not working ? ?  ?Exercises    ? ?Assessment/Plan  ?  ?PT Assessment Patient needs continued PT services  ?PT Problem List Decreased strength;Decreased range of motion;Decreased activity tolerance;Decreased balance;Decreased mobility;Decreased knowledge of use of DME;Decreased knowledge of precautions ? ?   ?  ?PT  Treatment Interventions DME instruction;Gait training;Stair training;Functional mobility training;Therapeutic activities;Therapeutic exercise;Balance training;Patient/family education;Cognitive remediation   ? ?PT Goals (Current goals can be found in the Care Plan section)  ?Acute Rehab PT Goals ?Patient Stated Goal: wants to get back to walking ?PT Goal Formulation: With patient ?Time For Goal Achievement: 02/04/22 ?Potential to Achieve Goals: Good ? ?  ?Frequency Min 3X/week ?  ? ? ?Co-evaluation   ?  ?  ?  ?  ? ? ?  ?AM-PAC PT "6 Clicks" Mobility  ?Outcome Measure Help needed turning from your back to your side while in a flat bed without using bedrails?: None ?Help needed moving from lying on your back to sitting on the side of a flat bed without using bedrails?: A Little ?Help needed moving to and from a bed to a chair (including a wheelchair)?: A Little ?Help needed standing up from a chair using your arms (e.g., wheelchair or bedside chair)?: A Little ?Help needed to walk in hospital room?: Total ?Help needed climbing 3-5 steps with a railing? : Total ?6 Click Score: 15 ? ?  ?End of Session Equipment Utilized During Treatment: Gait belt ?Activity Tolerance: Patient limited by fatigue ?Patient left: in chair;with call bell/phone within reach;with chair alarm set ?Nurse Communication: Mobility status (recommend +2 for safety) ?PT Visit Diagnosis: Unsteadiness on feet (R26.81);Muscle weakness (generalized) (M62.81);Difficulty in walking, not elsewhere classified (R26.2);Other symptoms and signs involving the nervous system (R29.898) ?  ? ?  Time: 3016-0109 ?PT Time Calculation (min) (ACUTE ONLY): 33 min ? ? ?Charges:   PT Evaluation ?$PT Eval Moderate Complexity: 1 Mod ?PT Treatments ?$Therapeutic Activity: 8-22 mins ?  ?   ? ? ? ?Jerolyn Center, PT ?Acute Rehabilitation Services  ?Pager [phone] ?Office [phone] ? ? ?Scherrie November Lana Flaim ?01/21/2022, 9:00 AM ?

## 2022-01-21 NOTE — [place] FL2 (Addendum)
?[place] MEDICAID FL2 LEVEL OF CARE SCREENING TOOL  ?  ? ?IDENTIFICATION  ?Patient Name: ?Gabriel Hanson Birthdate: [birth year] Sex: male Admission Date (Current Location): ?01/18/2022  ?[place] Number: ? [place] ?  Facility and Address:  ?[facility], [address], [place], [place] [address] ?     Provider Number: ?[id number]  ?Attending Physician Name and Address:  ?Dorcas Carrow, MD ? Relative Name and Phone Number:  ?  ?   ?Current Level of Care: ? Hospital  Recommended Level of Care: ?Skilled Nursing Facility Prior Approval Number: ?  ? ?Date Approved/Denied: ?  PASRR Number: ?[id number] A ? ?Discharge Plan: ?SNF ?  ? ?Current Diagnoses: ?Patient Active Problem List  ? Diagnosis Date Noted  ? Altered mental status 01/19/2022  ? Leukocytosis 01/19/2022  ? Hypoalbuminemia due to protein-calorie malnutrition (HCC) 01/19/2022  ? Hyperglycemia 01/19/2022  ? Dehydration 01/19/2022  ? Sepsis (HCC) 01/19/2022  ? CAP (community acquired pneumonia) 01/18/2022  ? Attention deficit 12/17/2017  ? Numbness 05/17/2015  ? Gait disturbance 05/17/2015  ? High risk medication use 01/15/2015  ? Urinary hesitancy 01/15/2015  ? Cognitive dysfunction 03/18/2010  ? Erectile dysfunction 03/18/2010  ? Depression 03/18/2010  ? Multiple sclerosis (HCC) 03/18/2010  ? UNSPECIFIED DISORDER OF EYE 03/18/2010  ? Constipation 03/18/2010  ? RECTAL BLEEDING 03/18/2010  ? PSORIASIS 03/18/2010  ? WEIGHT LOSS 03/18/2010  ? ? ?Orientation RESPIRATION BLADDER Height & Weight   ?  ?Self, Time, Situation, Place ? Normal Incontinent, External catheter Weight: 180 lb (81.6 kg) ?Height:  6\' 1"  (185.4 cm)  ?BEHAVIORAL SYMPTOMS/MOOD NEUROLOGICAL BOWEL NUTRITION STATUS  ?    Incontinent Diet  ?AMBULATORY STATUS COMMUNICATION OF NEEDS Skin   ?Limited Assist Verbally Normal ?  ?  ?  ?    ?     ?     ? ? ?Personal Care Assistance Level of Assistance  ?Bathing, Feeding, Dressing Bathing Assistance: Limited  assistance ?Feeding assistance: Limited assistance ?Dressing Assistance: Limited assistance ?   ? ?Functional Limitations Info  ?Sight, Hearing, Speech Sight Info: Adequate ?Hearing Info: Impaired ?Speech Info: Impaired  ? ? ?SPECIAL CARE FACTORS FREQUENCY  ?PT (By licensed PT), OT (By licensed OT)   ?  ?PT Frequency: 5x a week ?OT Frequency: 5x a week ?  ?  ?  ?   ? ? ?Contractures Contractures Info: Not present  ? ? ?Additional Factors Info  ?Code Status, Allergies Code Status Info: Full ?Allergies Info: NKA ?  ?  ?  ?   ? ?Current Medications (01/21/2022):  This is the current hospital active medication list ?Current Facility-Administered Medications  ?Medication Dose Route Frequency Provider Last Rate Last Admin  ? acetaminophen (TYLENOL) tablet 650 mg  650 mg Oral Q6H PRN Adefeso, Oladapo, DO   650 mg at 01/20/22 2053  ? Or  ? acetaminophen (TYLENOL) suppository 650 mg  650 mg Rectal Q6H PRN Adefeso, Oladapo, DO      ? azithromycin (ZITHROMAX) 500 mg in sodium chloride 0.9 % 250 mL IVPB  500 mg Intravenous Q24H 2054, MD   Stopped at 01/20/22 1741  ? cefTRIAXone (ROCEPHIN) 2 g in sodium chloride 0.9 % 100 mL IVPB  2 g Intravenous Q24H 01/22/22, MD 200 mL/hr at 01/21/22 1449 2 g at 01/21/22 1449  ? dextromethorphan-guaiFENesin (MUCINEX DM) 30-600 MG per 12 hr tablet 1 tablet  1 tablet Oral BID Adefeso, Oladapo, DO   1 tablet at 01/21/22 0936  ? enoxaparin (  LOVENOX) injection 40 mg  40 mg Subcutaneous Q24H Adefeso, Oladapo, DO   40 mg at 01/21/22 7353  ? feeding supplement (ENSURE ENLIVE / ENSURE PLUS) liquid 237 mL  237 mL Oral BID BM Adefeso, Oladapo, DO   237 mL at 01/21/22 1450  ? potassium chloride (KLOR-CON) packet 40 mEq  40 mEq Oral BID Dorcas Carrow, MD   40 mEq at 01/21/22 2992  ? tamsulosin (FLOMAX) capsule 0.8 mg  0.8 mg Oral Daily Dorcas Carrow, MD   0.8 mg at 01/21/22 4268  ? ? ? ?Discharge Medications: ?Please see discharge summary for a list of discharge medications. ? ?Relevant  Imaging Results: ? ?Relevant Lab Results: ? ? ?Additional Information ?SSN [id number] ? ?Jimmy Picket, LCSW ? ? ? ? ?

## 2022-01-21 NOTE — Progress Notes (Signed)
Inpatient Rehab Admissions Coordinator:   Per therapy recommendations, patient was screened for CIR candidacy by Tynisa Vohs, MS, CCC-SLP. At this time, Pt. does not appear to demonstrate medical necessity to justify in hospital rehabilitation/CIR. will not pursue a rehab consult for this Pt.   Recommend other rehab venues to be pursued.  Please contact me with any questions.  Alondra Sahni, MS, CCC-SLP Rehab Admissions Coordinator  [phone] (celll) [phone] (office)   

## 2022-01-22 DIAGNOSIS — J189 Pneumonia, unspecified organism: Secondary | ICD-10-CM | POA: Diagnosis not present

## 2022-01-22 DIAGNOSIS — A419 Sepsis, unspecified organism: Secondary | ICD-10-CM | POA: Diagnosis not present

## 2022-01-22 DIAGNOSIS — R400 Somnolence: Secondary | ICD-10-CM | POA: Diagnosis not present

## 2022-01-22 DIAGNOSIS — G35 Multiple sclerosis: Secondary | ICD-10-CM | POA: Diagnosis not present

## 2022-01-22 LAB — CBC WITH DIFFERENTIAL/PLATELET
Abs Immature Granulocytes: 0.11 10*3/uL — ABNORMAL HIGH (ref 0.00–0.07)
Basophils Absolute: 0 10*3/uL (ref 0.0–0.1)
Basophils Relative: 0 %
Eosinophils Absolute: 0 10*3/uL (ref 0.0–0.5)
Eosinophils Relative: 0 %
HCT: 33.2 % — ABNORMAL LOW (ref 39.0–52.0)
Hemoglobin: 11.8 g/dL — ABNORMAL LOW (ref 13.0–17.0)
Immature Granulocytes: 1 %
Lymphocytes Relative: 4 %
Lymphs Abs: 0.5 10*3/uL — ABNORMAL LOW (ref 0.7–4.0)
MCH: 31.2 pg (ref 26.0–34.0)
MCHC: 35.5 g/dL (ref 30.0–36.0)
MCV: 87.8 fL (ref 80.0–100.0)
Monocytes Absolute: 0.7 10*3/uL (ref 0.1–1.0)
Monocytes Relative: 5 %
Neutro Abs: 10.9 10*3/uL — ABNORMAL HIGH (ref 1.7–7.7)
Neutrophils Relative %: 90 %
Platelets: 204 10*3/uL (ref 150–400)
RBC: 3.78 MIL/uL — ABNORMAL LOW (ref 4.22–5.81)
RDW: 12.9 % (ref 11.5–15.5)
WBC: 12.1 10*3/uL — ABNORMAL HIGH (ref 4.0–10.5)
nRBC: 0 % (ref 0.0–0.2)

## 2022-01-22 LAB — CULTURE, RESPIRATORY W GRAM STAIN: Culture: NORMAL

## 2022-01-22 LAB — COMPREHENSIVE METABOLIC PANEL
ALT: 60 U/L — ABNORMAL HIGH (ref 0–44)
AST: 113 U/L — ABNORMAL HIGH (ref 15–41)
Albumin: 1.7 g/dL — ABNORMAL LOW (ref 3.5–5.0)
Alkaline Phosphatase: 64 U/L (ref 38–126)
Anion gap: 6 (ref 5–15)
BUN: 17 mg/dL (ref 8–23)
CO2: 24 mmol/L (ref 22–32)
Calcium: 7.6 mg/dL — ABNORMAL LOW (ref 8.9–10.3)
Chloride: 96 mmol/L — ABNORMAL LOW (ref 98–111)
Creatinine, Ser: 0.65 mg/dL (ref 0.61–1.24)
GFR, Estimated: >60 mL/min (ref >60)
Glucose, Bld: 116 mg/dL — ABNORMAL HIGH (ref 70–99)
Potassium: 3.9 mmol/L (ref 3.5–5.1)
Sodium: 126 mmol/L — ABNORMAL LOW (ref 135–145)
Total Bilirubin: 0.5 mg/dL (ref 0.3–1.2)
Total Protein: 5 g/dL — ABNORMAL LOW (ref 6.5–8.1)

## 2022-01-22 MED ORDER — AZITHROMYCIN 500 MG PO TABS
500.0000 mg | ORAL_TABLET | Freq: Every day | ORAL | Status: DC
  Administered 2022-01-22: 500 mg via ORAL
  Filled 2022-01-22: qty 1

## 2022-01-22 NOTE — Progress Notes (Signed)
?PROGRESS NOTE ? ? ? ?Gabriel Hanson  ZOX:[id number] DOB: [birth year] DOA: 01/18/2022 ?PCP: [name], MD  ? ? ?Brief Narrative:  ?71 year old gentleman with history of multiple sclerosis, smoker, BPH presented to the ER with altered mental status found at home by family members.  Patient does have history of multiple sclerosis and recently worsening cognitive decline.  Seen by neurologist on 4/10 and treated with high-dose steroids.  In the emergency room, temperature 102.7, tachypneic and tachycardic, blood pressure stable.  Leukocytosis, lactic acid 1.8.  Chest x-ray with dense right mid and upper lobe consolidation concerning for pneumonia.  IV fluid resuscitation, started on antibiotics and admitted to the hospital. ?Urine Legionella antigen positive.  Urine cultures positive for staph hemolyticus. ? ? ?Assessment & Plan: ?  ?Severe sepsis present on admission due to pneumonia.  Urine Legionella antigen positive.  Immunocompromised with high-dose steroids. ?Clinically stabilizing. ?Presentation CT scan with large consolidative pneumonia right middle lobe. ?Repeat chest x-ray 4/15 with improvement of consolidation but extension of infiltrates. ?Received vancomycin and cefepime for 2 days, started on azithromycin 4/14. ?We will continue azithromycin for total 7 days. ?Continue Rocephin for staph hemolyticus. ?Aggressive bronchodilator therapy, respiratory therapy and mobility. ?Seen by speech therapy, no obvious aspiration. ? ?Acute UTI present on admission, symptomatic with dysuria.  Growing staph hemolyticus.  Blood cultures negative. Renal ultrasound with no obvious hydronephrosis or urinary retention.  Currently on rocephin.  ?We will change to oral antibiotics and treat for total 7 days. ? ?Acute metabolic encephalopathy in the setting of severe sepsis, underlying cognitive dysfunction: CT head was essentially normal.  No focal deficits.  Monitor.  Improving to his  baseline. ? ?Hypokalemia/hypophosphatemia: Replaced. ? ?History of multiple sclerosis: Cognitive decline.  Currently no new neurological deficits.  ?He completed 600 mg prednisone for 3 days recently. ?Vumerity at home.  Transient transaminitis probably related to sepsis.  We will recheck LFTs tomorrow, if normalizing, will resume Vumerity. ? ?Hyponatremia with hypochloremia: Probably secondary to Legionella pneumonia.  We will recheck tomorrow morning.  No intervention today.  Patient also drinks a lot of free water and ice tea, will restrict water intake. ? ?PT/OT/MedSurg bed.  Look for a SNF. ? ? ?DVT prophylaxis: enoxaparin (LOVENOX) injection 40 mg Start: 01/19/22 1000 ?SCDs Start: 01/19/22 0028 ? ? ?Code Status: Full code ?Family Communication: Sister Jamesetta So at the bedside. ?Disposition Plan: Status is: Inpatient ?Remains inpatient appropriate because: Treated for severe sepsis ?  ? ? ?Consultants:  ?None ? ?Procedures:  ?None ? ?Antimicrobials:  ?Vancomycin and cefepime 4/12---4/15 ?Rocephin 4/15-- ?Azithromycin 4/14- ? ? ?Subjective: ? ? ?Patient seen and examined.  Up in chair and butt hurts.  Overnight no other events.  Afebrile.  After long time, he feels better.  Sister at the bedside.  He is agreeable to go to SNF and ultimately agreeable to go to assisted living facility in the future.  Dry cough with minimal mucoid sputum. ? ?Objective: ?Vitals:  ? 01/22/22 0048 01/22/22 0335 01/22/22 0805 01/22/22 1300  ?BP: (!) 173/90 (!) 122/58 (!) 164/74 (!) 155/70  ?Pulse: (!) 101 100 100 98  ?Resp: 18 19 17 16   ?Temp: 98.1 ?F (36.7 ?C) 98 ?F (36.7 ?C)  98.2 ?F (36.8 ?C)  ?TempSrc: Oral Oral  Oral  ?SpO2: 95%  93% 94%  ?Weight:      ?Height:      ? ? ?Intake/Output Summary (Last 24 hours) at 01/22/2022 1356 ?Last data filed at 01/21/2022 1536 ?Gross per 24 hour  ?  Intake 100 ml  ?Output 500 ml  ?Net -400 ml  ? ?Filed Weights  ? 01/18/22 1947  ?Weight: 81.6 kg  ? ? ?Examination: ? ?General: Looks fairly comfortable  on room air at rest.  Slightly anxious. ?Cardiovascular: S1-S2 normal.  Regular rate rhythm.  Tachycardic. ?Respiratory: Fine crackles on the right side.  Good air entry on the left. ?Gastrointestinal: Soft.  Nontender. ?Ext: No swelling or edema.  No cyanosis. ?Neuro: Alert oriented x4.  Flat affect.  Some short-term memory loss.  Repeats questions at times. ?Musculoskeletal: No deformities. ?Skin: Intact. ? ? ? ? ? ?Data Reviewed: I have personally reviewed following labs and imaging studies ? ?CBC: ?Recent Labs  ?Lab 01/18/22 ?1945 01/19/22 ?0330 01/20/22 ?0128 01/21/22 ?0013 01/22/22 ?0036  ?WBC 15.9* 12.7* 10.6* 13.2* 12.1*  ?NEUTROABS 14.8*  --  9.8* 12.0* 10.9*  ?HGB 15.4 12.7* 14.2 12.0* 11.8*  ?HCT 45.8 37.5* 41.3 33.6* 33.2*  ?MCV 94.2 92.6 91.2 88.0 87.8  ?PLT 207 193 152 199 204  ? ?Basic Metabolic Panel: ?Recent Labs  ?Lab 01/18/22 ?1945 01/19/22 ?0330 01/20/22 ?0128 01/21/22 ?0013 01/22/22 ?0036  ?NA 135 133* 131* 131* 126*  ?K 3.8 3.6 4.7 3.0* 3.9  ?CL 102 102 102 100 96*  ?CO2 23 27 20* 24 24  ?GLUCOSE 146* 122* 97 113* 116*  ?BUN 22 19 21 17 17   ?CREATININE 0.89 0.82 0.69 0.77 0.65  ?CALCIUM 8.7* 8.0* 8.0* 7.7* 7.6*  ?MG  --  2.1 2.2  --   --   ?PHOS  --  2.5 1.8*  --   --   ? ?GFR: ?Estimated Creatinine Clearance: 97.1 mL/min (by C-G formula based on SCr of 0.65 mg/dL). ?Liver Function Tests: ?Recent Labs  ?Lab 01/18/22 ?1945 01/19/22 ?0330 01/20/22 ?0128 01/21/22 ?0013 01/22/22 ?0036  ?AST 21 17 44* 52* 113*  ?ALT 18 15 19 30  60*  ?ALKPHOS 57 49 55 53 64  ?BILITOT 1.3* 1.2 2.0* 0.7 0.5  ?PROT 6.2* 5.4* 5.4* 5.0* 5.0*  ?ALBUMIN 3.0* 2.3* 2.2* 1.9* 1.7*  ? ?No results for input(s): LIPASE, AMYLASE in the last 168 hours. ?Recent Labs  ?Lab 01/18/22 ?2000  ?AMMONIA 21  ? ?Coagulation Profile: ?Recent Labs  ?Lab 01/18/22 ?1945  ?INR 1.1  ? ?Cardiac Enzymes: ?Recent Labs  ?Lab 01/18/22 ?1945  ?CKTOTAL 58  ? ?BNP (last 3 results) ?No results for input(s): PROBNP in the last 8760 hours. ?HbA1C: ?No  results for input(s): HGBA1C in the last 72 hours. ?CBG: ?No results for input(s): GLUCAP in the last 168 hours. ?Lipid Profile: ?No results for input(s): CHOL, HDL, LDLCALC, TRIG, CHOLHDL, LDLDIRECT in the last 72 hours. ?Thyroid Function Tests: ?No results for input(s): TSH, T4TOTAL, FREET4, T3FREE, THYROIDAB in the last 72 hours. ?Anemia Panel: ?No results for input(s): VITAMINB12, FOLATE, FERRITIN, TIBC, IRON, RETICCTPCT in the last 72 hours. ?Sepsis Labs: ?Recent Labs  ?Lab 01/18/22 ?1945 01/19/22 ?0330  ?LATICACIDVEN 1.8 1.3  ? ? ?Recent Results (from the past 240 hour(s))  ?Culture, Urine     Status: None  ? Collection Time: 01/16/22  1:13 PM  ? Specimen: Urine  ? BL  ?Result Value Ref Range Status  ? Urine Culture, Routine Final report  Final  ? Organism ID, Bacteria No growth  Final  ?Resp Panel by RT-PCR (Flu A&B, Covid) Nasopharyngeal Swab     Status: None  ? Collection Time: 01/18/22  8:33 PM  ? Specimen: Nasopharyngeal Swab; Nasopharyngeal(NP) swabs in vial transport medium  ?Result Value Ref  Range Status  ? SARS Coronavirus 2 by RT PCR NEGATIVE NEGATIVE Final  ?  Comment: (NOTE) ?SARS-CoV-2 target nucleic acids are NOT DETECTED. ? ?The SARS-CoV-2 RNA is generally detectable in upper respiratory ?specimens during the acute phase of infection. The lowest ?concentration of SARS-CoV-2 viral copies this assay can detect is ?138 copies/mL. A negative result does not preclude SARS-Cov-2 ?infection and should not be used as the sole basis for treatment or ?other patient management decisions. A negative result may occur with  ?improper specimen collection/handling, submission of specimen other ?than nasopharyngeal swab, presence of viral mutation(s) within the ?areas targeted by this assay, and inadequate number of viral ?copies(<138 copies/mL). A negative result must be combined with ?clinical observations, patient history, and epidemiological ?information. The expected result is Negative. ? ?Fact Sheet for  Patients:  ?BloggerCourse.com ? ?Fact Sheet for Healthcare Providers:  ?SeriousBroker.it ? ?This test is no t yet approved or cleared by the [place] and  ?has been authorized for [employer]

## 2022-01-23 DIAGNOSIS — D72829 Elevated white blood cell count, unspecified: Secondary | ICD-10-CM | POA: Diagnosis not present

## 2022-01-23 DIAGNOSIS — R4189 Other symptoms and signs involving cognitive functions and awareness: Secondary | ICD-10-CM | POA: Diagnosis not present

## 2022-01-23 DIAGNOSIS — D649 Anemia, unspecified: Secondary | ICD-10-CM | POA: Diagnosis not present

## 2022-01-23 DIAGNOSIS — K5900 Constipation, unspecified: Secondary | ICD-10-CM | POA: Diagnosis not present

## 2022-01-23 DIAGNOSIS — A481 Legionnaires' disease: Secondary | ICD-10-CM | POA: Diagnosis not present

## 2022-01-23 DIAGNOSIS — R410 Disorientation, unspecified: Secondary | ICD-10-CM

## 2022-01-23 DIAGNOSIS — G35 Multiple sclerosis: Secondary | ICD-10-CM | POA: Diagnosis not present

## 2022-01-23 DIAGNOSIS — D72819 Decreased white blood cell count, unspecified: Secondary | ICD-10-CM | POA: Diagnosis not present

## 2022-01-23 DIAGNOSIS — S90412A Abrasion, left great toe, initial encounter: Secondary | ICD-10-CM | POA: Diagnosis not present

## 2022-01-23 DIAGNOSIS — E860 Dehydration: Secondary | ICD-10-CM | POA: Diagnosis not present

## 2022-01-23 DIAGNOSIS — E46 Unspecified protein-calorie malnutrition: Secondary | ICD-10-CM | POA: Diagnosis not present

## 2022-01-23 DIAGNOSIS — L539 Erythematous condition, unspecified: Secondary | ICD-10-CM | POA: Diagnosis not present

## 2022-01-23 DIAGNOSIS — F09 Unspecified mental disorder due to known physiological condition: Secondary | ICD-10-CM | POA: Diagnosis not present

## 2022-01-23 DIAGNOSIS — N181 Chronic kidney disease, stage 1: Secondary | ICD-10-CM | POA: Diagnosis not present

## 2022-01-23 DIAGNOSIS — J189 Pneumonia, unspecified organism: Secondary | ICD-10-CM | POA: Diagnosis not present

## 2022-01-23 DIAGNOSIS — R2689 Other abnormalities of gait and mobility: Secondary | ICD-10-CM | POA: Diagnosis not present

## 2022-01-23 DIAGNOSIS — Z7689 Persons encountering health services in other specified circumstances: Secondary | ICD-10-CM | POA: Diagnosis not present

## 2022-01-23 DIAGNOSIS — R4182 Altered mental status, unspecified: Secondary | ICD-10-CM | POA: Diagnosis not present

## 2022-01-23 DIAGNOSIS — R3911 Hesitancy of micturition: Secondary | ICD-10-CM | POA: Diagnosis not present

## 2022-01-23 DIAGNOSIS — R269 Unspecified abnormalities of gait and mobility: Secondary | ICD-10-CM | POA: Diagnosis not present

## 2022-01-23 DIAGNOSIS — R739 Hyperglycemia, unspecified: Secondary | ICD-10-CM | POA: Diagnosis not present

## 2022-01-23 DIAGNOSIS — S90411A Abrasion, right great toe, initial encounter: Secondary | ICD-10-CM | POA: Diagnosis not present

## 2022-01-23 DIAGNOSIS — N390 Urinary tract infection, site not specified: Secondary | ICD-10-CM | POA: Diagnosis not present

## 2022-01-23 DIAGNOSIS — F32A Depression, unspecified: Secondary | ICD-10-CM | POA: Diagnosis not present

## 2022-01-23 DIAGNOSIS — L408 Other psoriasis: Secondary | ICD-10-CM | POA: Diagnosis not present

## 2022-01-23 DIAGNOSIS — Z9181 History of falling: Secondary | ICD-10-CM | POA: Diagnosis not present

## 2022-01-23 DIAGNOSIS — H579 Unspecified disorder of eye and adnexa: Secondary | ICD-10-CM | POA: Diagnosis not present

## 2022-01-23 DIAGNOSIS — R209 Unspecified disturbances of skin sensation: Secondary | ICD-10-CM | POA: Diagnosis not present

## 2022-01-23 DIAGNOSIS — M6281 Muscle weakness (generalized): Secondary | ICD-10-CM | POA: Diagnosis not present

## 2022-01-23 DIAGNOSIS — E8809 Other disorders of plasma-protein metabolism, not elsewhere classified: Secondary | ICD-10-CM | POA: Diagnosis not present

## 2022-01-23 DIAGNOSIS — R41840 Attention and concentration deficit: Secondary | ICD-10-CM | POA: Diagnosis not present

## 2022-01-23 LAB — COMPREHENSIVE METABOLIC PANEL
ALT: 104 U/L — ABNORMAL HIGH (ref 0–44)
AST: 155 U/L — ABNORMAL HIGH (ref 15–41)
Albumin: 1.6 g/dL — ABNORMAL LOW (ref 3.5–5.0)
Alkaline Phosphatase: 62 U/L (ref 38–126)
Anion gap: 6 (ref 5–15)
BUN: 14 mg/dL (ref 8–23)
CO2: 28 mmol/L (ref 22–32)
Calcium: 7.8 mg/dL — ABNORMAL LOW (ref 8.9–10.3)
Chloride: 98 mmol/L (ref 98–111)
Creatinine, Ser: 0.64 mg/dL (ref 0.61–1.24)
GFR, Estimated: >60 mL/min (ref >60)
Glucose, Bld: 115 mg/dL — ABNORMAL HIGH (ref 70–99)
Potassium: 4.2 mmol/L (ref 3.5–5.1)
Sodium: 132 mmol/L — ABNORMAL LOW (ref 135–145)
Total Bilirubin: 0.4 mg/dL (ref 0.3–1.2)
Total Protein: 4.6 g/dL — ABNORMAL LOW (ref 6.5–8.1)

## 2022-01-23 LAB — LEGIONELLA PNEUMOPHILA SEROGP 1 UR AG: L. pneumophila Serogp 1 Ur Ag: POSITIVE — AB

## 2022-01-23 LAB — CULTURE, BLOOD (ROUTINE X 2)
Culture: NO GROWTH
Culture: NO GROWTH
Special Requests: ADEQUATE
Special Requests: ADEQUATE

## 2022-01-23 LAB — EXPECTORATED SPUTUM ASSESSMENT W GRAM STAIN, RFLX TO RESP C

## 2022-01-23 MED ORDER — CEFDINIR 300 MG PO CAPS: 300.0000 mg | ORAL_CAPSULE | Freq: Two times a day (BID) | ORAL | 0 refills | Status: AC

## 2022-01-23 MED ORDER — GUAIFENESIN-CODEINE 100-10 MG/5ML PO SOLN: 10.0000 mL | Freq: Four times a day (QID) | ORAL | 0 refills | Status: DC | PRN

## 2022-01-23 MED ORDER — AZITHROMYCIN 500 MG PO TABS
500.0000 mg | ORAL_TABLET | Freq: Every day | ORAL | 0 refills | Status: AC
Start: 2022-01-23 — End: 2022-01-27

## 2022-01-23 NOTE — TOC Transition Note (Signed)
Transition of Care (TOC) - CM/SW Discharge Note ? ? ?Patient Details  ?Name: Gabriel Hanson ?MRN: [id number] ?Date of Birth: 05/17/1951 ? ?Transition of Care (TOC) CM/SW Contact:  ?Mearl Latin, LCSW ?Phone Number: ?01/23/2022, 1:06 PM ? ? ?Clinical Narrative:    ?Patient will DC to: [facility] ?Anticipated DC date: 01/23/22 ?Family notified: Sister, Jamesetta So ?Transport by: Sister by car around 3pm ? ? ?Per MD patient ready for DC to [facility]. RN to call report prior to discharge [phone]). RN, patient, patient's family, and facility notified of DC. Discharge Summary and FL2 sent to facility.  ? ?CSW will sign off for now as social work intervention is no longer needed. Please consult [place] again if new needs arise. ? ? ? ? ?Final next level of care: Skilled Nursing Facility ?Barriers to Discharge: Barriers Resolved ? ? ?Patient Goals and CMS Choice ?Patient states their goals for this hospitalization and ongoing recovery are:: get stronger with SNF then go home ?CMS Medicare.gov Compare Post Acute Care list provided to:: Patient ?Choice offered to / list presented to : Patient, Sibling ? ?Discharge Placement ?  ?Existing PASRR number confirmed : 01/23/22          ?Patient chooses bed at: [facility] ?Patient to be transferred to facility by: car ?Name of family member notified: Sister ?Patient and family notified of of transfer: 01/23/22 ? ?Discharge Plan and Services ?  ?  ?           ?  ?  ?  ?  ?  ?  ?  ?  ?  ?  ? ?Social Determinants of Health (SDOH) Interventions ?  ? ? ?Readmission Risk Interventions ?   ? View : No data to display.  ?  ?  ?  ? ? ? ? ? ?

## 2022-01-23 NOTE — Discharge Summary (Signed)
Physician Discharge Summary  ?Gabriel Hanson HUD:[id number] DOB: [birth year] DOA: 01/18/2022 ? ?PCP: [name], MD ? ?Admit date: 01/18/2022 ?Discharge date: 01/23/2022 ? ?Admitted From: Home ?Disposition: Skilled nursing facility ? ?Recommendations for Outpatient Follow-up:  ?Follow up with PCP in 1-2 weeks ?Please obtain CMP/CBC in one week ?Follow-up with neurology as scheduled. ? ?Home Health: N/A ?Equipment/Devices: N/A ? ?Discharge Condition: Fair ?CODE STATUS: Full code ?Diet recommendation: Regular diet, nutritional supplements ? ?Discharge summary: ?71 year old gentleman with history of multiple sclerosis, smoker, BPH presented to the ER with altered mental status found at home by family members.  Patient does have history of multiple sclerosis and recently worsening cognitive decline.  Seen by neurologist on 4/10 and treated with high-dose steroids.  In the emergency room, temperature 102.7, tachypneic and tachycardic, blood pressure stable.  Leukocytosis, lactic acid 1.8.  Chest x-ray with dense right mid and upper lobe consolidation concerning for pneumonia.  IV fluid resuscitation, started on antibiotics and admitted to the hospital. ?Urine Legionella antigen positive.  Urine cultures positive for staph hemolyticus. ?  ?Plan of care: ?# Severe sepsis present on admission due to Legionella pneumonia.  Urine Legionella antigen positive.  Immunocompromised with high-dose steroids. Clinically stabilizing. ?Presentation CT scan with large consolidative pneumonia right middle lobe. ?Repeat chest x-ray 4/15 with improvement of consolidation but extension of infiltrates. ?Received vancomycin and cefepime and subsequently azithromycin.  ?We will continue azithromycin for total 7 days. ?Continue Mucinex, respiratory therapy and chest physiotherapy at the skilled nursing facility. ?Seen by speech therapy, no obvious aspiration. ?  ?# Acute UTI present on admission, symptomatic with dysuria.  Pansensitive staph  hemolyticus.  Blood cultures negative. Renal ultrasound with no obvious hydronephrosis or urinary retention.  Currently on rocephin. We will change to oral cefdinir and treat for total 7 days. ?  ?# Acute metabolic encephalopathy in the setting of severe sepsis, underlying cognitive dysfunction: CT head was essentially normal.  No focal deficits.  Monitor.  Improving to his baseline. ?His mentation has mostly improved. ?  ?# Hypokalemia/hypophosphatemia: Replaced and adequate. ? ?# History of multiple sclerosis: Cognitive decline.  Currently no new neurological deficits.  ?He completed 600 mg prednisone for 3 days recently. Vumerity at home.  Transient transaminitis probably related to sepsis.  will resume Vumerity.  He will follow-up with neurology. ?  ?# Hyponatremia with hypochloremia: Probably secondary to Legionella pneumonia and also drinking a lot of iced tea.  Fluid restriction followed by improvement of sodium.  ?Moderate fluid intake, less than 2 L in 24 hours.   ? ?Patient is medically stable to transfer to skilled level of care.  He has gradually worsening mobility issues and will need more support system at home.  Given his neurological status, after he completes rehab he may even benefit with living in assisted facility.  This was conveyed to patient who agrees.  Also conveyed to family. ? ? ? ?Discharge Diagnoses:  ?Principal Problem: ?  CAP (community acquired pneumonia) ?Active Problems: ?  Multiple sclerosis (HCC) ?  Altered mental status ?  Leukocytosis ?  Hypoalbuminemia due to protein-calorie malnutrition (HCC) ?  Hyperglycemia ?  Dehydration ?  Sepsis (HCC) ? ? ? ?Discharge Instructions ? ?Discharge Instructions   ? ? Call MD for:  difficulty breathing, headache or visual disturbances   Complete by: As directed ?  ? Diet general   Complete by: As directed ?  ? Discharge instructions   Complete by: As directed ?  ? Continue doing lung exercises  ?  Increase activity slowly   Complete by: As  directed ?  ? ?  ? ?Allergies as of 01/23/2022   ?No Known Allergies ?  ? ?  ?Medication List  ?  ? ?STOP taking these medications   ? ?predniSONE 50 MG tablet ?Commonly known as: DELTASONE ?  ? ?  ? ?TAKE these medications   ? ?amphetamine-dextroamphetamine 20 MG tablet ?Commonly known as: Adderall ?Take one or two pills a day as needed. ?What changed:  ?how much to take ?how to take this ?when to take this ?reasons to take this ?additional instructions ?  ?azithromycin 500 MG tablet ?Commonly known as: ZITHROMAX ?Take 1 tablet (500 mg total) by mouth daily for 4 days. ?  ?cefdinir 300 MG capsule ?Commonly known as: OMNICEF ?Take 1 capsule (300 mg total) by mouth 2 (two) times daily for 2 days. ?  ?guaiFENesin-codeine 100-10 MG/5ML syrup ?Take 10 mLs by mouth every 6 (six) hours as needed for cough. ?  ?naproxen sodium 220 MG tablet ?Commonly known as: ALEVE ?Take 660 mg by mouth daily. ?  ?tamsulosin 0.4 MG Caps capsule ?Commonly known as: FLOMAX ?Take 2 capsules (0.8 mg total) by mouth daily. ?  ?Vumerity 231 MG Cpdr ?Generic drug: Diroximel Fumarate ?Take 2 capsules by mouth 2 (two) times daily. ?  ? ?  ? ? ?No Known Allergies ? ?Consultations: ?None ? ? ?Procedures/Studies: ?CT Head Wo Contrast ? ?Result Date: 01/18/2022 ?CLINICAL DATA:  Mental status change, unknown cause. EXAM: CT HEAD WITHOUT CONTRAST TECHNIQUE: Contiguous axial images were obtained from the base of the skull through the vertex without intravenous contrast. RADIATION DOSE REDUCTION: This exam was performed according to the departmental dose-optimization program which includes automated exposure control, adjustment of the mA and/or kV according to patient size and/or use of iterative reconstruction technique. COMPARISON:  06/26/2021 FINDINGS: Brain: No acute intracranial hemorrhage, midline shift or mass effect. No extra-axial fluid collection. Mild periventricular white matter hypodensities are noted bilaterally. No hydrocephalus. Vascular: No  hyperdense vessel or unexpected calcification. Skull: Normal. Negative for fracture or focal lesion. Sinuses/Orbits: No acute finding. Other: Questionable subcutaneous fat stranding over the frontal bone on the left. IMPRESSION: 1. No acute intracranial hemorrhage. 2. Scattered periventricular white matter hypodensities may represent chronic microvascular ischemic changes versus changes related to patient's known multiple sclerosis. 3. Questionable soft tissue swelling and subcutaneous fat stranding over the frontal bone on the left, possible contusion. Correlation with physical exam is recommended. Electronically Signed   By: Thornell Sartorius M.D.   On: 01/18/2022 22:21  ? ?CT CHEST WO CONTRAST ? ?Result Date: 01/19/2022 ?CLINICAL DATA:  Pneumonia EXAM: CT CHEST WITHOUT CONTRAST TECHNIQUE: Multidetector CT imaging of the chest was performed following the standard protocol without IV contrast. RADIATION DOSE REDUCTION: This exam was performed according to the departmental dose-optimization program which includes automated exposure control, adjustment of the mA and/or kV according to patient size and/or use of iterative reconstruction technique. COMPARISON:  01/18/2022, 10/11/2017 FINDINGS: Cardiovascular: Limited without IV contrast. Aorta atherosclerotic. No aneurysm. No acute mediastinal hemorrhage or hematoma. Normal heart size. Native coronary atherosclerosis present. No pericardial effusion. Mediastinum/Nodes: Thyroid unremarkable. Trachea and central airways are patent. Esophagus nondilated. No bulky adenopathy. Mildly prominent right paratracheal and hilar lymph nodes with short axis measurements of 9 mm favored to be reactive related to the right upper lobe pneumonia described below. Lungs/Pleura: Extensive right upper lobe diffuse mixed interstitial and consolidative opacities with central air bronchograms compatible with right upper lobe pneumonia extending to  the pleural margins and fissures. Sparing of the  right middle and lower lobe. Background emphysema noted with hyperinflation. Lingula and bibasilar atelectasis present. Within the right lower lobe posterior medially, there is a chronic low-density oval l

## 2022-01-23 NOTE — TOC Progression Note (Signed)
Transition of Care (TOC) - Progression Note  ? ? ?Patient Details  ?Name: Gabriel Hanson ?MRN: [id number] ?Date of Birth: 04/24/1951 ? ?Transition of Care (TOC) CM/SW Contact  ?Mearl Latin, LCSW ?Phone Number: ?01/23/2022, 1:02 PM ? ?Clinical Narrative:    ?CSW spoke with patient and patient's sister, Gabriel Hanson [phone]) at bedside. She is here from [place] assisting patient while their other sister is in [place] for two weeks. CSW provided SNF bed offers and answered questions about future needs, including applying for Medicaid.  ? ?Patient and Gabriel Hanson prefer [facility]. [facility] can accept patient today. Ex-brother in law will arrive around 3pm to help transport patient by car.  ? ? ?Expected Discharge Plan: Skilled Nursing Facility ?Barriers to Discharge: Continued Medical Work up ? ?Expected Discharge Plan and Services ?Expected Discharge Plan: Skilled Nursing Facility ?  ?  ?  ?Living arrangements for the past 2 months: Single Family Home ?Expected Discharge Date: 01/23/22               ?  ?  ?  ?  ?  ?  ?  ?  ?  ?  ? ? ?Social Determinants of Health (SDOH) Interventions ?  ? ?Readmission Risk Interventions ?   ? View : No data to display.  ?  ?  ?  ? ? ?

## 2022-01-23 NOTE — Progress Notes (Signed)
Physical Therapy Treatment ?Patient Details ?Name: Gabriel Hanson ?MRN: [id number] ?DOB: 07-Jan-1951 ?Today's Date: 01/23/2022 ? ? ?History of Present Illness 71 y.o. male who presents to the emergency department via EMS 01/18/22 due to altered mental status. +pna; CT head no acute findings;   PMH significant of MS, depression, and tobacco use ? ?  ?PT Comments  ? ? Patient progressing well towards PT goals. Session focused on progressive ambulation and transfers. Pt eager and motivated to work with therapy. Requires Min A for transfers and for gait training with use of RW for support. 2 seated rest breaks needed due to fatigue and weakness. Sp02 >90% on RA and HR up to 127 bpm max with activity. Pt continues to be a high fall risk due to impaired balance and weakness. Continue to recommend SNF. Will follow. ?  ?Recommendations for follow up therapy are one component of a multi-disciplinary discharge planning process, led by the attending physician.  Recommendations may be updated based on patient status, additional functional criteria and insurance authorization. ? ?Follow Up Recommendations ? Acute inpatient rehab (3hours/day) ?  ?  ?Assistance Recommended at Discharge Frequent or constant Supervision/Assistance  ?Patient can return home with the following Direct supervision/assist for medications management;Direct supervision/assist for financial management;Help with stairs or ramp for entrance;A little help with walking and/or transfers;A little help with bathing/dressing/bathroom;Assist for transportation ?  ?Equipment Recommendations ? Rolling walker (2 wheels)  ?  ?Recommendations for Other Services   ? ? ?  ?Precautions / Restrictions Precautions ?Precautions: Fall ?Precaution Comments: reports near falls PTA ?Restrictions ?Weight Bearing Restrictions: No  ?  ? ?Mobility ? Bed Mobility ?  ?  ?  ?  ?  ?  ?  ?General bed mobility comments: pt in recliner upon arrival ?  ? ?Transfers ?Overall transfer level: Needs  assistance ?Equipment used: Rolling walker (2 wheels) ?Transfers: Sit to/from Stand ?Sit to Stand: Min assist ?  ?Step pivot transfers: Min assist ?  ?  ?  ?General transfer comment: Min A to steady in standing, stood from chair x3, from EOB x1. Cues for hand placement/technique and to reach back for chair prior to sitting. ?  ? ?Ambulation/Gait ?Ambulation/Gait assistance: Min assist ?Gait Distance (Feet): 20 Feet (+ 24' +44') ?Assistive device: Rolling walker (2 wheels) ?Gait Pattern/deviations: Trunk flexed, Step-through pattern, Decreased stride length, Decreased step length - right, Decreased step length - left ?Gait velocity: decreased ?  ?  ?General Gait Details: SLow, mildly unsteady gait with flexed trunk and bil knees, cues to gaze forward which results in worsened sway and balance. 2 seated rest breaks. Sp02 >90% on RA and HR up to 127 bpm max. ? ? ?Stairs ?  ?  ?  ?  ?  ? ? ?Wheelchair Mobility ?  ? ?Modified Rankin (Stroke Patients Only) ?  ? ? ?  ?Balance Overall balance assessment: Needs assistance ?Sitting-balance support: Feet supported, No upper extremity supported ?Sitting balance-Leahy Scale: Fair ?  ?  ?Standing balance support: During functional activity, Reliant on assistive device for balance ?Standing balance-Leahy Scale: Poor ?Standing balance comment: Min A as well due to sway ?  ?  ?  ?  ?  ?  ?  ?  ?  ?  ?  ?  ? ?  ?Cognition Arousal/Alertness: Awake/alert ?Behavior During Therapy: [facility] for tasks assessed/performed ?Overall Cognitive Status: No family/caregiver present to determine baseline cognitive functioning ?  ?  ?  ?  ?  ?  ?  ?  ?  ?  ?  ?  ?  ?  ?  ?  ?  General Comments: Per sister, pt is at his cognitive baseline. Follows commands, poor STM at baseline. ?  ?  ? ?  ?Exercises   ? ?  ?General Comments General comments (skin integrity, edema, etc.): Sister present during session. Sp02 >90% on RA and HR up to 127 bpm max. ?  ?  ? ?Pertinent Vitals/Pain Pain Assessment ?Pain  Assessment: Faces ?Faces Pain Scale: Hurts little more ?Pain Location: bottom/back ?Pain Descriptors / Indicators: Sore, Discomfort ?Pain Intervention(s): Monitored during session  ? ? ?Home Living   ?  ?  ?  ?  ?  ?  ?  ?  ?  ?   ?  ?Prior Function    ?  ?  ?   ? ?PT Goals (current goals can now be found in the care plan section) Progress towards PT goals: Progressing toward goals ? ?  ?Frequency ? ? ? Min 3X/week ? ? ? ?  ?PT Plan Current plan remains appropriate  ? ? ?Co-evaluation   ?  ?  ?  ?  ? ?  ?AM-PAC PT "6 Clicks" Mobility   ?Outcome Measure ? Help needed turning from your back to your side while in a flat bed without using bedrails?: None ?Help needed moving from lying on your back to sitting on the side of a flat bed without using bedrails?: A Little ?Help needed moving to and from a bed to a chair (including a wheelchair)?: A Little ?Help needed standing up from a chair using your arms (e.g., wheelchair or bedside chair)?: A Little ?Help needed to walk in hospital room?: A Little ?Help needed climbing 3-5 steps with a railing? : A Lot ?6 Click Score: 18 ? ?  ?End of Session Equipment Utilized During Treatment: Gait belt ?Activity Tolerance: Patient tolerated treatment well ?Patient left: in chair;with call bell/phone within reach;with chair alarm set;with family/visitor present ?Nurse Communication: Mobility status ?PT Visit Diagnosis: Unsteadiness on feet (R26.81);Muscle weakness (generalized) (M62.81);Difficulty in walking, not elsewhere classified (R26.2);Other symptoms and signs involving the nervous system (R29.898) ?  ? ? ?Time: 2423-5361 ?PT Time Calculation (min) (ACUTE ONLY): 38 min ? ?Charges:  $Gait Training: 23-37 mins ?$Therapeutic Activity: 8-22 mins          ?          ? ?Vale Haven, PT, DPT ?Acute Rehabilitation Services ?Secure chat preferred ?Office [phone] ? ? ? ? ? ?Blake Divine A Corayma Cashatt ?01/23/2022, 1:03 PM ? ?

## 2022-01-23 NOTE — Care Management Important Message (Signed)
Important Message ? ?Patient Details  ?Name: Gabriel Hanson ?MRN: [id number] ?Date of Birth: 1951-02-20 ? ? ?Medicare Important Message Given:  Yes ? ? ? ? ?Zaccheus Edmister ?01/23/2022, 4:02 PM ?

## 2022-01-24 DIAGNOSIS — A481 Legionnaires' disease: Secondary | ICD-10-CM | POA: Diagnosis not present

## 2022-01-24 DIAGNOSIS — R4189 Other symptoms and signs involving cognitive functions and awareness: Secondary | ICD-10-CM | POA: Diagnosis not present

## 2022-01-24 DIAGNOSIS — N390 Urinary tract infection, site not specified: Secondary | ICD-10-CM | POA: Diagnosis not present

## 2022-01-24 DIAGNOSIS — G35 Multiple sclerosis: Secondary | ICD-10-CM | POA: Diagnosis not present

## 2022-01-27 DIAGNOSIS — K5900 Constipation, unspecified: Secondary | ICD-10-CM | POA: Diagnosis not present

## 2022-01-27 DIAGNOSIS — L539 Erythematous condition, unspecified: Secondary | ICD-10-CM | POA: Diagnosis not present

## 2022-01-27 DIAGNOSIS — S90411A Abrasion, right great toe, initial encounter: Secondary | ICD-10-CM | POA: Diagnosis not present

## 2022-01-27 DIAGNOSIS — Z7689 Persons encountering health services in other specified circumstances: Secondary | ICD-10-CM | POA: Diagnosis not present

## 2022-01-27 DIAGNOSIS — G35 Multiple sclerosis: Secondary | ICD-10-CM | POA: Diagnosis not present

## 2022-01-27 DIAGNOSIS — S90412A Abrasion, left great toe, initial encounter: Secondary | ICD-10-CM | POA: Diagnosis not present

## 2022-01-31 ENCOUNTER — Other Ambulatory Visit: Payer: Self-pay | Admitting: *Deleted

## 2022-01-31 NOTE — Patient Outreach (Signed)
THN Post- Acute Care Coordinator follow up. Per [place] eligible member currently resides in [facility] SN[facility].  Screening for potential [facility] care coordination services as a benefit of [employer] plan. ? ?Mr. Blanchette admitted to SNF on 01/23/22 after hospitalization. ? ?Facility site visit to [facility] skilled nursing facility. Met with Claiborne Billings, Admissions Coordinator concerning transition plan,and potential [facility] needs. Anticipated transition plan is return home alone. Has supportive sister and ex-brother in law.  ? ?Spoke with Mr. Capri  in room at [facility] SN[facility] to discuss transition plans and [facility] follow up. Mr. Zingaro reports he lives alone. States he has supportive ex-brother in Sports coach and sister.  Mr. Burmester states he was using public transportation prior but not sure if he will continue due to unsteady gait. State his brother in law will assist him as needed post SNF. Continues to work with therapy.  ? ?Discussed [facility] follow up. Mr. Nylund states he has not seen his PCP in a while. States he primarily follows up with his neurologist. Explained his PCP at [facility] at [facility] has [facility] embedded care coordination team available if needed.  ? ?Provided [facility] Care Management brochure, 24-hr nurse advice line magnet, and writer's contact information.  ? ?Will continue to follow for transition plans and potential THN needs.  ? ? ?Marthenia Rolling, MSN, RN,BSN ?[place] Coordinator ?[phone] [facility]) ?[phone]  (Toll free office)  ? ? ? ? ? ? ?  ?

## 2022-02-01 DIAGNOSIS — G35 Multiple sclerosis: Secondary | ICD-10-CM | POA: Diagnosis not present

## 2022-02-01 DIAGNOSIS — Z7689 Persons encountering health services in other specified circumstances: Secondary | ICD-10-CM | POA: Diagnosis not present

## 2022-02-09 ENCOUNTER — Telehealth: Payer: Self-pay | Admitting: Neurology

## 2022-02-09 MED ORDER — METHYLPHENIDATE HCL 10 MG PO TABS: 10.0000 mg | ORAL_TABLET | ORAL | 0 refills | Status: DC

## 2022-02-09 NOTE — Telephone Encounter (Signed)
Pt states he is being released from the hospital on Wed. Of next week.  Pt is asking if he can be put back on the medication that he was on months ago(pt states he has not had it this year.  Pt states its like Adderall) Pt states it was prescribed by Dr Epimenio Foot, please call. ?

## 2022-02-09 NOTE — Telephone Encounter (Signed)
Pt called back states medication is called ritatlin that he is needing once released from the hospital Wednesday 02/15/2022.  ?

## 2022-02-09 NOTE — Addendum Note (Signed)
Addended by: Asa Lente on: 02/09/2022 08:39 PM ? ? Modules accepted: Orders ? ?

## 2022-02-09 NOTE — Telephone Encounter (Addendum)
Reviewed pt chart. He is on Vumerity for MS. Checked drug registry, last refilled Dextroamp-Amphetamin 20 Mg Tab 11/18/21 #60. Per previous notes, he reported Ritalin did not help much with fatigue in the past.  ? ?I called pt to further discuss. Currently at [facility] in rehab. Admitted 01/18/22 for pneumonia/sepsis. Potential discharge 02/15/22. He wants to try Ritalin 10mg  again instead of adderall. Could not tolerate higher dose of Ritalin in the past but wanting to try low dose. Does not feel adderall helps much. Not taking currently. Stopped giving him adderall once he was admitted to [facility]. Infection has cleared up. He feels better. Still having issues with fatigue.  ? ?Pharmacy: [id number] - [place], [place] - [address] ?

## 2022-02-10 DIAGNOSIS — R209 Unspecified disturbances of skin sensation: Secondary | ICD-10-CM | POA: Diagnosis not present

## 2022-02-10 DIAGNOSIS — G35 Multiple sclerosis: Secondary | ICD-10-CM | POA: Diagnosis not present

## 2022-02-13 ENCOUNTER — Other Ambulatory Visit: Payer: Self-pay | Admitting: *Deleted

## 2022-02-13 NOTE — Patient Outreach (Signed)
THN Post- Acute Care Coordinator follow up. Per [place] eligible member currently resides in [facility] SN[facility].  Screening for potential [facility] care coordination services as a benefit of [employer] plan. ? ?Member's PCP at [facility] has [place] care coordination team. ? ?Facility site visit to [facility] skilled nursing facility. Met with Gabriel Hanson at bedside concerning transition plan. Gabriel Hanson states the plan is to return home alone. States his brother in law is very helpful and assists. States his neighbor also checks on him frequently. Unsure of discharge date.   ? ?SNF SW was in a meeting during writer's facility visit. Therefore, unable to speak with her to discuss transition plan/date. Secure communication sent to inquire.  ? ?Will continue to follow.  ? ? ?Marthenia Rolling, MSN, RN,BSN ?[place] Coordinator ?[phone] [facility]) ?[phone]  (Toll free office)  ? ?  ?

## 2022-02-14 DIAGNOSIS — N181 Chronic kidney disease, stage 1: Secondary | ICD-10-CM | POA: Diagnosis not present

## 2022-02-14 DIAGNOSIS — D649 Anemia, unspecified: Secondary | ICD-10-CM | POA: Diagnosis not present

## 2022-02-14 DIAGNOSIS — D72819 Decreased white blood cell count, unspecified: Secondary | ICD-10-CM | POA: Diagnosis not present

## 2022-02-14 DIAGNOSIS — Z7689 Persons encountering health services in other specified circumstances: Secondary | ICD-10-CM | POA: Diagnosis not present

## 2022-02-14 DIAGNOSIS — E860 Dehydration: Secondary | ICD-10-CM | POA: Diagnosis not present

## 2022-02-15 ENCOUNTER — Telehealth: Payer: Self-pay | Admitting: Neurology

## 2022-02-15 DIAGNOSIS — Z9181 History of falling: Secondary | ICD-10-CM | POA: Diagnosis not present

## 2022-02-15 DIAGNOSIS — R2689 Other abnormalities of gait and mobility: Secondary | ICD-10-CM | POA: Diagnosis not present

## 2022-02-15 DIAGNOSIS — M6281 Muscle weakness (generalized): Secondary | ICD-10-CM | POA: Diagnosis not present

## 2022-02-15 NOTE — Telephone Encounter (Signed)
Called and spoke w/ pt. He is being discharged from rehab today. He does not think he is getting MS DMT. Does not feel well/cannot get around w/o walker. He is very frustrated and concerned since he is being discharged today. I offered to give [facility] to call and schedule delivery but he got frustrated and ended call. I called back. Offered to speak with nurse at facility while he is on the phone. He found nurse. I spoke w/ his nurse, Jocelyn Lamer. She confirmed he has been getting Vumerity 231mg , 2 caps po BID. She will explain all this to him before he is discharged. A[facility].  ?I explained this to him and he verbalized understanding. I scheduled sooner f/u for 02/27/22 at 1:30pm with Dr. Felecia Shelling.  ? ? ? ?

## 2022-02-15 NOTE — Telephone Encounter (Signed)
Pt called stating that he has been hospitalized for over a month and they are not treating his MS and is feeling horrible. Pt sates that he is not getting his medications and is needing to speak to the RN.  ?

## 2022-02-21 ENCOUNTER — Telehealth: Payer: Self-pay | Admitting: Neurology

## 2022-02-21 NOTE — Telephone Encounter (Signed)
At 9:27 this morning Gracie, PT with [facility] left a vm stating pt has requested to wait until he see's the Doctor(Gracie did not state what Dr pt was referring to on vm) she said pt wants to see the Dr. Before his PT assessment for Home health PT.  Berline Lopes can be reached at [phone] if there are questions. ?

## 2022-02-21 NOTE — Telephone Encounter (Signed)
Noted patient has an upcoming apt scheduled 02/27/22 ?

## 2022-02-27 ENCOUNTER — Encounter: Payer: Self-pay | Admitting: Neurology

## 2022-02-27 ENCOUNTER — Ambulatory Visit (INDEPENDENT_AMBULATORY_CARE_PROVIDER_SITE_OTHER): Payer: Medicare Other | Admitting: Neurology

## 2022-02-27 ENCOUNTER — Telehealth: Payer: Self-pay | Admitting: Neurology

## 2022-02-27 VITALS — BP 116/72 | HR 97 | Ht 73.0 in | Wt 179.0 lb

## 2022-02-27 DIAGNOSIS — F09 Unspecified mental disorder due to known physiological condition: Secondary | ICD-10-CM

## 2022-02-27 DIAGNOSIS — R269 Unspecified abnormalities of gait and mobility: Secondary | ICD-10-CM | POA: Diagnosis not present

## 2022-02-27 DIAGNOSIS — G35 Multiple sclerosis: Secondary | ICD-10-CM | POA: Diagnosis not present

## 2022-02-27 DIAGNOSIS — N319 Neuromuscular dysfunction of bladder, unspecified: Secondary | ICD-10-CM

## 2022-02-27 DIAGNOSIS — R3911 Hesitancy of micturition: Secondary | ICD-10-CM

## 2022-02-27 DIAGNOSIS — Z79899 Other long term (current) drug therapy: Secondary | ICD-10-CM

## 2022-02-27 MED ORDER — METHYLPHENIDATE HCL 20 MG PO TABS: 20.0000 mg | ORAL_TABLET | ORAL | 0 refills | Status: DC

## 2022-02-27 NOTE — Telephone Encounter (Signed)
Referral for Urology sent to [facility] [phone].

## 2022-02-27 NOTE — Progress Notes (Signed)
[facility]  PATIENT: Gabriel Hanson DOB: 04-01-1951  REFERRING DOCTOR OR PCP:  None  SOURCE: patient  _________________________________   HISTORICAL  CHIEF COMPLAINT:  Chief Complaint  Patient presents with   Follow-up    Rm 2, w a good friend and sister in lobby. Here to f/u from recent hospital visit. Pt is currently taking  of methylphenidate. Unsure if he is taking correct dosage. Pt is having urinary and bowel incontinence, last month or 2. Needing a handicap sign. Concerns with balance after hospital visit.     HISTORY OF PRESENT ILLNESS:  Gabriel Hanson is a 71 y.o. man with relapsing remitting MS.       Update 02/27/2022. He was hospitalized for pneumonia March/April 2023.  He received IV antibiotics and then did 3 weeks of facility Rehab.    He is on Vumerity.   He tolerates it well now.    Lymphocytes were 0.9 a month before the pneumonia but 0.3 last month while hospitalized.  I discussed with him that I would like to recheck the lymphocyte counts.  If they are below the lower limit of normal I might want to reduce the Vumerity dose.  Due to balance, gait he is needing to use a walker more.  He recently got a standard rolling walker.  Balance is more of an issue than strength, though he has mild right leg weakness.  We discussed he might do better with a rollator style walker and a seat would also add a measure of safety if he gets tired for longer distances.    He is having more urinary urgency and some incontinence.   He usually has urgency before the leakage incontinence.    He drinks a lot of tea (one gallon a day with sugar).      The tamsulosin helps the hesitancy but not the urgency.   He goes frequently.     He continues to have difficulty with memory and other cognitive skills though is able to live on his own.  He gets help from a friend and his sister.  His cognition does better when he is on a stimulant.  Years ago he was on  methylphenidate and then was on Adderall for a couple years.  More recently he was on methylphenidate.  He feels his 20 mg of methylphenidate has helped more than 10.     Today, we also discussed living arrangements.  He is currently living alone in an apartment though he does have help.  He is having some difficulty with some task in his home and is concerned he may not be ale to continue living alone.      Besides the cognitive issues he also notes fatigue.Marland Kitchen  He has done better taking Adderall 20 mg in the morning rather than 10 mg twice a day.Marland Kitchen  He falls asleep fairly well most nights but will wake up a few times, sometimes because of his bladder.  He denies depression.      01/16/2022   11:57 AM  Montreal Cognitive Assessment   Visuospatial/ Executive (0/5) 5  Naming (0/3) 3  Attention: Read list of digits (0/2) 2  Attention: Read list of letters (0/1) 1  Attention: Serial 7 subtraction starting at 100 (0/3) 3  Language: Repeat phrase (0/2) 1  Language : Fluency (0/1) 1  Abstraction (0/2) 2  Delayed Recall (0/5) 3  Orientation (0/6) 6  Total 27    MS History:  He was diagnosed with  MS in 2001 when he presented with right visual loss.     He had an MRI worrisome for MS.   He was placed on IV steroids and vision got 80% better.    He was placed on an interferon first and then Copaxone because he had tolerability issues.     For two years, he was without insurance and stopped all DMT.   I started seeing him in 2015 and we started Tecfidera.   He tolerates it well but sometimes forgets the second pill because all his medications are qAM.  MRI brain 2011 shows white matter foci in the periventricular and juxtacortical white matter consistent with the clinical diagnosis of MS. The right optic nerve was reduced in size. There w was one focus in the right pons and one in the left cerebellar hemisphere, as well. None of the foci were enhancing during that MRI.   MRI 04/01/2019 brain showed  supratentorial and infratentorial lesions unchanged. Compared to 02/22/2016  REVIEW OF SYSTEMS: Constitutional: No fevers, chills, sweats, or change in appetite.  He notes some fatigue. Occasional insomnia. Eyes: No visual changes, double vision, eye pain Ear, nose and throat: No hearing loss, ear pain, nasal congestion, sore throat Cardiovascular: No chest pain, palpitations Respiratory:  No shortness of breath at rest or with exertion.   No wheezes GastrointestinaI: No nausea, vomiting, diarrhea, abdominal pain, fecal incontinence.  Severe constipation Genitourinary:  as above.  Musculoskeletal:  No neck pain, back pain Integumentary: No rash, pruritus, skin lesions Neurological: as above Psychiatric: Mild depression. Cognitive decline. Endocrine: No palpitations, diaphoresis, change in appetite, change in weigh or increased thirst Hematologic/Lymphatic:  No anemia, purpura, petechiae. Allergic/Immunologic: No itchy/runny eyes, nasal congestion, recent allergic reactions, rashes  ALLERGIES: No Known Allergies  HOME MEDICATIONS:  Current Outpatient Medications:    [facility] SENNA 8.6 MG tablet, Take 2 tablets by mouth every evening., Disp: , Rfl:    Diroximel Fumarate (VUMERITY) 231 MG CPDR, Take 2 capsules by mouth 2 (two) times daily., Disp: 120 capsule, Rfl: 11   naproxen sodium (ALEVE) 220 MG tablet, Take 660 mg by mouth daily., Disp: , Rfl:    tamsulosin (FLOMAX) 0.4 MG CAPS capsule, Take 2 capsules (0.8 mg total) by mouth daily., Disp: 180 capsule, Rfl: 3   methylphenidate (RITALIN) 20 MG tablet, Take 1 tablet (20 mg total) by mouth every morning., Disp: 30 tablet, Rfl: 0  PAST MEDICAL HISTORY: Past Medical History:  Diagnosis Date   Depressive disorder, not elsewhere classified    Disorder of eye, unspecified    Memory loss    Multiple sclerosis (HCC)    Other persistent mental disorders due to conditions classified elsewhere    Other psoriasis    Psychosexual dysfunction,  unspecified     PAST SURGICAL HISTORY: Past Surgical History:  Procedure Laterality Date   KNEE ARTHROSCOPY     right    TONSILLECTOMY      FAMILY HISTORY: Family History  Problem Relation Age of Onset   Breast cancer Mother    COPD Father    Heart disease Unknown     SOCIAL HISTORY:  Social History   Socioeconomic History   Marital status: Single    Spouse name: Not on file   Number of children: 0   Years of education: Not on file   Highest education level: Bachelor's degree (e.g., BA, AB, BS)  Occupational History   Occupation: Energy manager: [employer]    Comment: Fulltime  Tobacco Use   Smoking status: Heavy Smoker    Packs/day: 1.00    Years: 44.00    Pack years: 44.00    Types: Cigarettes   Smokeless tobacco: Never  Vaping Use   Vaping Use: Every day  Substance and Sexual Activity   Alcohol use: Yes    Alcohol/week: 0.0 standard drinks    Comment: Occasionally/fim   Drug use: Yes    Types: Marijuana   Sexual activity: Not on file  Other Topics Concern   Not on file  Social History Narrative   Not on file   Social Determinants of Health   Financial Resource Strain: Not on file  Food Insecurity: Not on file  Transportation Needs: Not on file  Physical Activity: Not on file  Stress: Not on file  Social Connections: Not on file  Intimate Partner Violence: Not on file     PHYSICAL EXAM  Vitals:   02/27/22 1337  BP: 116/72  Pulse: 97  Weight: 179 lb (81.2 kg)  Height: 6\' 1"  (1.854 m)     Body mass index is 23.62 kg/m.   General: The patient is well-developed and well-nourished and in no acute distress  Neurologic Exam  Mental status: The patient is alert and oriented x 3 at the time of the examination.   Ok memory but some distractibility.   He scored 27/30 on the [facility] cognitive assessment (details above).  Speech is normal.  Cranial nerves: Extraocular movements show a left INO. He has a 1+ APD on the right.    Vision is blurry and colors desaturated on the right.   Facial strength and sensation is normal. Trapezius strength is normal. No obvious hearing deficits are noted.  Motor:  Muscle bulk is normal.   Tone is slightly increased n legs.  . Strength is  5 / 5 in all 4 extremities   However, reduced RAM in left arm and foot    Sensory: Sensation is normal in his arms but reduced in the left foot to touch  Coordination: Cerebellar testing shows good bilateral finger-nose-finger slightly but reduced bilateral heel-to-shin  Gait and station: Station is normal.   He can walk around the room without his walker.  The gait is wide and mildly spastic and shows slight right weakness.  He can walk faster with the walker and appears more stable.    He cannot do a tandem gait.  Romberg was positive.  Reflexes: Deep tendon reflexes are symmetric and mildly increased in the Legs with spread at the knees.     DIAGNOSTIC DATA (LABS, IMAGING, TESTING) - I reviewed patient records, labs, notes, testing and imaging myself where available.  Lab Results  Component Value Date   WBC 12.1 (H) 01/22/2022   HGB 11.8 (L) 01/22/2022   HCT 33.2 (L) 01/22/2022   MCV 87.8 01/22/2022   PLT 204 01/22/2022      Component Value Date/Time   NA 132 (L) 01/23/2022 0305   NA 142 09/28/2017 1000   K 4.2 01/23/2022 0305   CL 98 01/23/2022 0305   CO2 28 01/23/2022 0305   GLUCOSE 115 (H) 01/23/2022 0305   BUN 14 01/23/2022 0305   BUN 13 09/28/2017 1000   CREATININE 0.64 01/23/2022 0305   CALCIUM 7.8 (L) 01/23/2022 0305   PROT 4.6 (L) 01/23/2022 0305   PROT 6.5 12/15/2021 1512   ALBUMIN 1.6 (L) 01/23/2022 0305   ALBUMIN 4.5 12/15/2021 1512   AST 155 (H) 01/23/2022 0305   ALT  104 (H) 01/23/2022 0305   ALKPHOS 62 01/23/2022 0305   BILITOT 0.4 01/23/2022 0305   BILITOT 0.6 12/15/2021 1512   GFRNONAA >60 01/23/2022 0305   GFRAA 103 09/28/2017 1000       ASSESSMENT AND PLAN  Multiple sclerosis (HCC) - Plan:  Ambulatory referral to Urology, CBC with Differential/Platelet, Hepatic function panel  Urinary hesitancy - Plan: Ambulatory referral to Urology  Cognitive dysfunction  Bladder dysfunction - Plan: Ambulatory referral to Urology  High risk medication use - Plan: CBC with Differential/Platelet, Hepatic function panel  Gait disturbance   1.   Continue Vumerity.    Check CBC/D,  2.   Ritalin 20 mg daily. 3.   Continue  tamsulosin.  Symptoms have worsened and we will refer to urology 4.   We discussed a rollator style walker.   use a shower chair due to his reduced balance. 5.   rtc 6 months.  40-minute office visit with the majority of the time spent face-to-face for history and physical, discussion/counseling and decision-making.  Additional time with record review and documentation.    Julion Gatt A. Epimenio Foot, MD, PhD 02/27/2022, 2:34 PM Certified in Neurology, Clinical Neurophysiology, Sleep Medicine, Pain Medicine and Neuroimaging  [facility] [address] [place], [place] [address] [phone]

## 2022-02-28 LAB — CBC WITH DIFFERENTIAL/PLATELET
Basophils Absolute: 0.1 10*3/uL (ref 0.0–0.2)
Basos: 1 %
EOS (ABSOLUTE): 0.1 10*3/uL (ref 0.0–0.4)
Eos: 1 %
Hematocrit: 39.2 % (ref 37.5–51.0)
Hemoglobin: 13.2 g/dL (ref 13.0–17.7)
Immature Grans (Abs): 0 10*3/uL (ref 0.0–0.1)
Immature Granulocytes: 0 %
Lymphocytes Absolute: 0.7 10*3/uL (ref 0.7–3.1)
Lymphs: 13 %
MCH: 30.8 pg (ref 26.6–33.0)
MCHC: 33.7 g/dL (ref 31.5–35.7)
MCV: 92 fL (ref 79–97)
Monocytes Absolute: 0.4 10*3/uL (ref 0.1–0.9)
Monocytes: 8 %
Neutrophils Absolute: 4 10*3/uL (ref 1.4–7.0)
Neutrophils: 77 %
Platelets: 273 10*3/uL (ref 150–450)
RBC: 4.28 x10E6/uL (ref 4.14–5.80)
RDW: 13.2 % (ref 11.6–15.4)
WBC: 5.3 10*3/uL (ref 3.4–10.8)

## 2022-02-28 LAB — HEPATIC FUNCTION PANEL
ALT: 10 [IU]/L (ref 0–44)
AST: 14 [IU]/L (ref 0–40)
Albumin: 4.2 g/dL (ref 3.8–4.8)
Alkaline Phosphatase: 86 [IU]/L (ref 44–121)
Bilirubin Total: 0.8 mg/dL (ref 0.0–1.2)
Bilirubin, Direct: 0.19 mg/dL (ref 0.00–0.40)
Total Protein: 6.6 g/dL (ref 6.0–8.5)

## 2022-02-28 NOTE — Telephone Encounter (Signed)
Referral for Urology sent to [facility] [phone]. 

## 2022-03-01 ENCOUNTER — Other Ambulatory Visit: Payer: Self-pay | Admitting: Neurology

## 2022-03-01 ENCOUNTER — Telehealth: Payer: Self-pay | Admitting: Neurology

## 2022-03-01 DIAGNOSIS — R7401 Elevation of levels of liver transaminase levels: Secondary | ICD-10-CM

## 2022-03-01 DIAGNOSIS — G35 Multiple sclerosis: Secondary | ICD-10-CM

## 2022-03-01 MED ORDER — VUMERITY 231 MG PO CPDR: 1.0000 | DELAYED_RELEASE_CAPSULE | Freq: Two times a day (BID) | ORAL | 11 refills | Status: DC

## 2022-03-01 NOTE — Telephone Encounter (Signed)
Called the patient and advised of the results. Informed the pt that Dr Epimenio Foot would like for him to cut his dose down to 1 pill twice a day of the Vumerity. He would like him to also come in next week towards the end of the week and have his labs rechecked. The order is already in and pt will plan to come in. Pt verbalized understanding.     Per Dr Epimenio Foot: Please let him know:  His liver tests were elevated.  Additionally his lymphocytes were low.  I would like him to cut the Vumerity down to 1 pill twice a day.  I placed an order for a future liver function test and hepatitis panel.  He should get these labs done towards the end of next week.  Thank you

## 2022-03-07 ENCOUNTER — Telehealth: Payer: Self-pay | Admitting: Neurology

## 2022-03-07 NOTE — Telephone Encounter (Signed)
[facility].Alycia Rossetti) Have been attempting to schedule with patient to come to home. Pt has been putting [place] off. If have not seen him by Sunday will have to non admit his chart. Will need an order for delay in start of care. Will see pt by 6/4 or before.

## 2022-03-07 NOTE — Telephone Encounter (Signed)
Called [facility] and provided verbal for delay in start of care. Alycia Rossetti states that they will get out to him as soon as pt will allow it.

## 2022-04-05 ENCOUNTER — Other Ambulatory Visit: Payer: Self-pay | Admitting: Neurology

## 2022-04-05 MED ORDER — METHYLPHENIDATE HCL 20 MG PO TABS: 20.0000 mg | ORAL_TABLET | ORAL | 0 refills | Status: DC

## 2022-04-05 NOTE — Telephone Encounter (Signed)
Pt is requesting a refill for methylphenidate (RITALIN) 20 MG tablet .  Pharmacy: [id number]   

## 2022-04-27 ENCOUNTER — Encounter: Payer: Self-pay | Admitting: Neurology

## 2022-05-01 ENCOUNTER — Other Ambulatory Visit: Payer: Self-pay | Admitting: *Deleted

## 2022-05-01 DIAGNOSIS — R269 Unspecified abnormalities of gait and mobility: Secondary | ICD-10-CM

## 2022-05-01 DIAGNOSIS — F09 Unspecified mental disorder due to known physiological condition: Secondary | ICD-10-CM

## 2022-05-01 DIAGNOSIS — G35 Multiple sclerosis: Secondary | ICD-10-CM

## 2022-05-02 NOTE — Telephone Encounter (Signed)
Spoke w/ MD. He approved filling out FL2 form for pt. In process of completing.

## 2022-05-02 NOTE — Telephone Encounter (Signed)
Referral for a SOCIAL WORK, Dr. Epimenio Foot needs to fill out an FL2 and then I will submit that to the [facility].

## 2022-05-08 ENCOUNTER — Other Ambulatory Visit: Payer: Self-pay | Admitting: Neurology

## 2022-05-08 MED ORDER — METHYLPHENIDATE HCL 20 MG PO TABS: 20.0000 mg | ORAL_TABLET | ORAL | 0 refills | Status: DC

## 2022-05-08 NOTE — Telephone Encounter (Signed)
Pt is calling and requesting a refill on methylphenidate (RITALIN) 20 MG tablet. Pt is requesting a call back [id number] [place], [place]

## 2022-06-16 ENCOUNTER — Other Ambulatory Visit: Payer: Self-pay | Admitting: Neurology

## 2022-06-16 MED ORDER — METHYLPHENIDATE HCL 20 MG PO TABS: 20.0000 mg | ORAL_TABLET | ORAL | 0 refills | Status: DC

## 2022-06-16 NOTE — Telephone Encounter (Signed)
Pt request refill for methylphenidate (RITALIN) [facility] [phone]

## 2022-06-16 NOTE — Telephone Encounter (Signed)
Last OV 02/27/22 and next one 09/14/22. Per drug registry, last refilled 05/08/22 #30.

## 2022-06-19 DIAGNOSIS — Z23 Encounter for immunization: Secondary | ICD-10-CM | POA: Diagnosis not present

## 2022-06-22 ENCOUNTER — Other Ambulatory Visit: Payer: Self-pay | Admitting: Neurology

## 2022-06-22 ENCOUNTER — Telehealth: Payer: Self-pay | Admitting: Neurology

## 2022-06-22 DIAGNOSIS — G35 Multiple sclerosis: Secondary | ICD-10-CM

## 2022-06-22 MED ORDER — VUMERITY 231 MG PO CPDR: 1.0000 | DELAYED_RELEASE_CAPSULE | Freq: Two times a day (BID) | ORAL | 1 refills | Status: DC

## 2022-06-22 NOTE — Telephone Encounter (Signed)
Pt is needing a refill on his Diroximel Fumarate (VUMERITY) 231 MG CPDR sent in to the [facility]

## 2022-06-22 NOTE — Telephone Encounter (Signed)
Refill sent for the patient as requested

## 2022-06-29 ENCOUNTER — Ambulatory Visit: Payer: Medicare Other | Admitting: Neurology

## 2022-07-11 ENCOUNTER — Telehealth: Payer: Self-pay | Admitting: Neurology

## 2022-07-11 NOTE — Telephone Encounter (Signed)
Pt is needing a refill request for his  methylphenidate (RITALIN) 20 MG tablet [facility] [address]

## 2022-07-12 ENCOUNTER — Other Ambulatory Visit: Payer: Self-pay

## 2022-07-12 MED ORDER — METHYLPHENIDATE HCL 20 MG PO TABS: 20.0000 mg | ORAL_TABLET | ORAL | 0 refills | Status: DC

## 2022-07-12 NOTE — Telephone Encounter (Signed)
Pt called again wanting to know when his methylphenidate (RITALIN) 20 MG tablet will be called in to his pharmacy.

## 2022-07-12 NOTE — Telephone Encounter (Signed)
Pt has an up coming appt and has been checked in the registry. 

## 2022-08-10 ENCOUNTER — Other Ambulatory Visit: Payer: Self-pay | Admitting: Neurology

## 2022-08-10 ENCOUNTER — Encounter: Payer: Self-pay | Admitting: Neurology

## 2022-08-10 MED ORDER — METHYLPHENIDATE HCL 20 MG PO TABS: 20.0000 mg | ORAL_TABLET | ORAL | 0 refills | Status: DC

## 2022-08-10 NOTE — Telephone Encounter (Signed)
Pt is requesting a refill for methylphenidate (RITALIN) 20 MG tablet .  Pharm[facility]: [id number]

## 2022-08-23 ENCOUNTER — Telehealth: Payer: Self-pay | Admitting: Neurology

## 2022-08-23 NOTE — Telephone Encounter (Signed)
[phone]. Spoke w/ tech. Advised rx methylphenidate last e-scribed by Dr. Epimenio Foot 08/10/22 #30. Should be on file. Pharmacy tech states pt picked this up 08/17/22 #30.   Called and spoke with pt. Relayed above information. He forgot that his sister picked up his prescription on 08/17/22 for him. He will contact her to get medication. Nothing further needed.

## 2022-08-23 NOTE — Telephone Encounter (Signed)
Pt is calling. [facility] that [facility] was not request for refill. Pt is requesting someone [facility].

## 2022-09-07 ENCOUNTER — Other Ambulatory Visit: Payer: Self-pay | Admitting: Neurology

## 2022-09-07 DIAGNOSIS — G35 Multiple sclerosis: Secondary | ICD-10-CM

## 2022-09-14 ENCOUNTER — Encounter: Payer: Self-pay | Admitting: Neurology

## 2022-09-14 ENCOUNTER — Ambulatory Visit (INDEPENDENT_AMBULATORY_CARE_PROVIDER_SITE_OTHER): Payer: Medicare Other | Admitting: Neurology

## 2022-09-14 VITALS — BP 134/90 | HR 109 | Ht 73.0 in | Wt 186.5 lb

## 2022-09-14 DIAGNOSIS — G35 Multiple sclerosis: Secondary | ICD-10-CM | POA: Diagnosis not present

## 2022-09-14 DIAGNOSIS — Z79899 Other long term (current) drug therapy: Secondary | ICD-10-CM

## 2022-09-14 DIAGNOSIS — F09 Unspecified mental disorder due to known physiological condition: Secondary | ICD-10-CM | POA: Diagnosis not present

## 2022-09-14 DIAGNOSIS — R269 Unspecified abnormalities of gait and mobility: Secondary | ICD-10-CM

## 2022-09-14 DIAGNOSIS — R3911 Hesitancy of micturition: Secondary | ICD-10-CM | POA: Diagnosis not present

## 2022-09-14 MED ORDER — METHYLPHENIDATE HCL 20 MG PO TABS: 20.0000 mg | ORAL_TABLET | ORAL | 0 refills | Status: DC

## 2022-09-14 NOTE — Progress Notes (Signed)
[facility]  PATIENT: Gabriel Hanson DOB: [birth year]  REFERRING DOCTOR OR PCP:  None  SOURCE: patient  _________________________________   HISTORICAL  CHIEF COMPLAINT:  Chief Complaint  Patient presents with   Follow-up    Pt in room #10 with his sister. Pt here today to f/u with his MS.    HISTORY OF PRESENT ILLNESS:  Gabriel Hanson is a 71 y.o. man with relapsing remitting MS.       Update 09/14/2022. He is on Vumerity.   He tolerates it well now.    Lymphocytes were 0.5. So we reduced dose to 231 mg po bid.    He has difficulty with his gait and uses a cane outdoors and a walker in the house.Marland Kitchen   He has had some mild falls.  Balance is more of an issue than strength, though he has mild right leg weakness  He has numbness in toes, left > right.  He is having more urinary urgency and some incontinence.   He usually has urgency before the leakage incontinence.    He drinks a lot of tea (one gallon a day with sugar).      The tamsulosin helps the hesitancy but not the urgency.     He continues to have difficulty with memory and other cognitive skills though is able to live on his own.  He gets help from a friend and his sister.   His cognition is better with methylphenidate.  Fatigue is also better.    He scored 27/30 on the MoCA earlier this year.   He falls asleep fairly well most nights but will wake up a few times, sometimes because of his bladder.  He denies depression.   Today, we also discussed living arrangements.  He is currently living alone in an apartment though he does have help.  He is having some difficulty with some task in his home and is concerned he may not be ale to continue living alone.          01/16/2022   11:57 AM  Montreal Cognitive Assessment   Visuospatial/ Executive (0/5) 5  Naming (0/3) 3  Attention: Read list of digits (0/2) 2  Attention: Read list of letters (0/1) 1  Attention: Serial 7 subtraction starting at 100 (0/3) 3   Language: Repeat phrase (0/2) 1  Language : Fluency (0/1) 1  Abstraction (0/2) 2  Delayed Recall (0/5) 3  Orientation (0/6) 6  Total 27    MS History:  He was diagnosed with MS in 2001 when he presented with right visual loss.     He had an MRI worrisome for MS.   He was placed on IV steroids and vision got 80% better.    He was placed on an interferon first and then Copaxone because he had tolerability issues.     For two years, he was without insurance and stopped all DMT.   I started seeing him in 2015 and we started Tecfidera.   He tolerates it well but sometimes forgets the second pill because all his medications are qAM.  MRI brain 2011 shows white matter foci in the periventricular and juxtacortical white matter consistent with the clinical diagnosis of MS. The right optic nerve was reduced in size. There w was one focus in the right pons and one in the left cerebellar hemisphere, as well. None of the foci were enhancing during that MRI.   MRI 04/01/2019 brain showed supratentorial and infratentorial lesions unchanged. Compared  to 02/22/2016  REVIEW OF SYSTEMS: Constitutional: No fevers, chills, sweats, or change in appetite.  He notes some fatigue. Occasional insomnia. Eyes: No visual changes, double vision, eye pain Ear, nose and throat: No hearing loss, ear pain, nasal congestion, sore throat Cardiovascular: No chest pain, palpitations Respiratory:  No shortness of breath at rest or with exertion.   No wheezes GastrointestinaI: No nausea, vomiting, diarrhea, abdominal pain, fecal incontinence.  Severe constipation Genitourinary:  as above.  Musculoskeletal:  No neck pain, back pain Integumentary: No rash, pruritus, skin lesions Neurological: as above Psychiatric: Mild depression. Cognitive decline. Endocrine: No palpitations, diaphoresis, change in appetite, change in weigh or increased thirst Hematologic/Lymphatic:  No anemia, purpura, petechiae. Allergic/Immunologic: No  itchy/runny eyes, nasal congestion, recent allergic reactions, rashes  ALLERGIES: No Known Allergies  HOME MEDICATIONS:  Current Outpatient Medications:    [facility] SENNA 8.6 MG tablet, Take 2 tablets by mouth every evening., Disp: , Rfl:    naproxen sodium (ALEVE) 220 MG tablet, Take 660 mg by mouth daily., Disp: , Rfl:    tamsulosin (FLOMAX) 0.4 MG CAPS capsule, Take 2 capsules (0.8 mg total) by mouth daily., Disp: 180 capsule, Rfl: 3   VUMERITY 231 MG CPDR, TAKE 1 CAPSULE BY MOUTH 2 (TWO) TIMES DAILY., Disp: 60 capsule, Rfl: 5   methylphenidate (RITALIN) 20 MG tablet, Take 1 tablet (20 mg total) by mouth every morning., Disp: 30 tablet, Rfl: 0  PAST MEDICAL HISTORY: Past Medical History:  Diagnosis Date   Depressive disorder, not elsewhere classified    Disorder of eye, unspecified    Memory loss    Multiple sclerosis ([place])    Other persistent mental disorders due to conditions classified elsewhere    Other psoriasis    Psychosexual dysfunction, unspecified     PAST SURGICAL HISTORY: Past Surgical History:  Procedure Laterality Date   KNEE ARTHROSCOPY     right    TONSILLECTOMY      FAMILY HISTORY: Family History  Problem Relation Age of Onset   Heart attack Mother    COPD Father    Hypotension Sister    Heart disease Other     SOCIAL HISTORY:  Social History   Socioeconomic History   Marital status: Single    Spouse name: Not on file   Number of children: 0   Years of education: Not on file   Highest education level: Bachelor's degree (e.g., BA, AB, BS)  Occupational History   Occupation: Agricultural consultant: [employer]    Comment: Fulltime  Tobacco Use   Smoking status: Heavy Smoker    Packs/day: 1.00    Years: 44.00    Total pack years: 44.00    Types: Cigarettes   Smokeless tobacco: Never  Vaping Use   Vaping Use: Every day  Substance and Sexual Activity   Alcohol use: Yes    Alcohol/week: 0.0 standard drinks of alcohol    Comment:  Occasionally/fim   Drug use: Yes    Types: Marijuana   Sexual activity: Not on file  Other Topics Concern   Not on file  Social History Narrative   Not on file   Social Determinants of Health   Financial Resource Strain: Low Risk  (09/28/2017)   Overall Financial Resource Strain (CARDIA)    Difficulty of Paying Living Expenses: Not very hard  Food Insecurity: Food Insecurity Present (09/28/2017)   Hunger Vital Sign    Worried About Running Out of Food in the Last Year: Often true  Ran Out of Food in the Last Year: Often true  Transportation Needs: No Transportation Needs (09/28/2017)   PRAPARE - Administrator, Civil Service (Medical): No    Lack of Transportation (Non-Medical): No  Physical Activity: Inactive (09/28/2017)   Exercise Vital Sign    Days of Exercise per Week: 0 days    Minutes of Exercise per Session: 0 min  Stress: Stress Concern Present (09/28/2017)   [employer] of Occupational Health - Occupational Stress Questionnaire    Feeling of Stress : To some extent  Social Connections: Socially Isolated (09/28/2017)   Social Connection and Isolation Panel [NHANES]    Frequency of Communication with Friends and Family: Never    Frequency of Social Gatherings with Friends and Family: Never    Attends Religious Services: Never    Database administrator or Organizations: No    Attends Banker Meetings: Never    Marital Status: Never married  Intimate Partner Violence: Not At Risk (09/28/2017)   Humiliation, Afraid, Rape, and Kick questionnaire    Fear of Current or Ex-Partner: No    Emotionally Abused: No    Physically Abused: No    Sexually Abused: No     PHYSICAL EXAM  Vitals:   09/14/22 1434  BP: (!) 134/90  Pulse: (!) 109  Weight: 186 lb 8 oz (84.6 kg)  Height: 6\' 1"  (1.854 m)     Body mass index is 24.61 kg/m.   General: The patient is well-developed and well-nourished and in no acute distress  Neurologic  Exam  Mental status: The patient is alert and oriented x 3 at the time of the examination.   Ok memory but some distractibility.   Mild reduced STM, reduced focus/attention.  Speech is normal.  Cranial nerves: Extraocular movements show a left INO but no diplopia. He has a 1+ APD on the right.   Colors desaturated on the right.   Facial strength and sensation is normal. Trapezius strength is normal. No obvious hearing deficits are noted.  Motor:  Muscle bulk is normal.   Tone is slightly increased n legs.  . Strength is  5 / 5 in all 4 extremities   However, reduced RAM in left arm and foot    Sensory: Sensation is normal in his arms but reduced in the left foot to touch  Coordination: Cerebellar testing shows good bilateral finger-nose-finger slightly but reduced bilateral heel-to-shin  Gait and station: Station is normal.   He can walk around the room without his cane but gait is very wide and also mildly spastic and shows slight right foot drop.  He can walk faster with the cane  and appears more stable.    He cannot do a tandem gait.  Romberg was positive.  Reflexes: Deep tendon reflexes are symmetric and mildly increased in the Legs with spread at the knees.     DIAGNOSTIC DATA (LABS, IMAGING, TESTING) - I reviewed patient records, labs, notes, testing and imaging myself where available.  Lab Results  Component Value Date   WBC 5.3 02/27/2022   HGB 13.2 02/27/2022   HCT 39.2 02/27/2022   MCV 92 02/27/2022   PLT 273 02/27/2022      Component Value Date/Time   NA 132 (L) 01/23/2022 0305   NA 142 09/28/2017 1000   K 4.2 01/23/2022 0305   CL 98 01/23/2022 0305   CO2 28 01/23/2022 0305   GLUCOSE 115 (H) 01/23/2022 0305   BUN 14  01/23/2022 0305   BUN 13 09/28/2017 1000   CREATININE 0.64 01/23/2022 0305   CALCIUM 7.8 (L) 01/23/2022 0305   PROT 6.6 02/27/2022 1421   ALBUMIN 4.2 02/27/2022 1421   AST 14 02/27/2022 1421   ALT 10 02/27/2022 1421   ALKPHOS 86 02/27/2022 1421    BILITOT 0.8 02/27/2022 1421   GFRNONAA >60 01/23/2022 0305   GFRAA 103 09/28/2017 1000       ASSESSMENT AND PLAN  Multiple sclerosis (HCC) - Plan: CBC with Differential/Platelet, Comprehensive metabolic panel  High risk medication use - Plan: CBC with Differential/Platelet, Comprehensive metabolic panel  Cognitive dysfunction  Gait disturbance  Urinary hesitancy   1.   Continue Vumerity 231 mg po bid (half dose).    Check CBC/D, CMP today 2.   Ritalin 20 mg daily.  We discussed that he can take daily 3.   Continue  tamsulosin.  Symptoms have worsened and we will refer to urology 4.   Use the Rollator style walker over cane as much as possible.     use a shower chair due to his reduced balance. 5.   rtc 6 months.  42-minute office visit with the majority of the time spent face-to-face for history and physical, discussion/counseling and decision-making.  Additional time with record review and documentation.    Dontario Evetts A. Felecia Shelling, MD, PhD [id number], 0000000 PM Certified in Neurology, Clinical Neurophysiology, Sleep Medicine, Pain Medicine and Neuroimaging  [facility] [address], [place], [place] [address] [phone]

## 2022-09-15 LAB — CBC WITH DIFFERENTIAL/PLATELET
Basophils Absolute: 0.1 10*3/uL (ref 0.0–0.2)
Basos: 1 %
EOS (ABSOLUTE): 0.1 10*3/uL (ref 0.0–0.4)
Eos: 1 %
Hematocrit: 42.9 % (ref 37.5–51.0)
Hemoglobin: 14.6 g/dL (ref 13.0–17.7)
Immature Grans (Abs): 0 10*3/uL (ref 0.0–0.1)
Immature Granulocytes: 0 %
Lymphocytes Absolute: 0.7 10*3/uL (ref 0.7–3.1)
Lymphs: 11 %
MCH: 30.2 pg (ref 26.6–33.0)
MCHC: 34 g/dL (ref 31.5–35.7)
MCV: 89 fL (ref 79–97)
Monocytes Absolute: 0.6 10*3/uL (ref 0.1–0.9)
Monocytes: 9 %
Neutrophils Absolute: 5.2 10*3/uL (ref 1.4–7.0)
Neutrophils: 78 %
Platelets: 372 10*3/uL (ref 150–450)
RBC: 4.84 x10E6/uL (ref 4.14–5.80)
RDW: 12.6 % (ref 11.6–15.4)
WBC: 6.7 10*3/uL (ref 3.4–10.8)

## 2022-09-15 LAB — COMPREHENSIVE METABOLIC PANEL
ALT: 14 IU/L (ref 0–44)
AST: 12 IU/L (ref 0–40)
Albumin/Globulin Ratio: 1.8 (ref 1.2–2.2)
Albumin: 4.3 g/dL (ref 3.9–4.9)
Alkaline Phosphatase: 92 IU/L (ref 44–121)
BUN/Creatinine Ratio: 19 (ref 10–24)
BUN: 18 mg/dL (ref 8–27)
Bilirubin Total: 0.6 mg/dL (ref 0.0–1.2)
CO2: 25 mmol/L (ref 20–29)
Calcium: 9.8 mg/dL (ref 8.6–10.2)
Chloride: 101 mmol/L (ref 96–106)
Creatinine, Ser: 0.93 mg/dL (ref 0.76–1.27)
Globulin, Total: 2.4 g/dL (ref 1.5–4.5)
Glucose: 89 mg/dL (ref 70–99)
Potassium: 4.8 mmol/L (ref 3.5–5.2)
Sodium: 141 mmol/L (ref 134–144)
Total Protein: 6.7 g/dL (ref 6.0–8.5)
eGFR: 88 mL/min/{1.73_m2} (ref >59)

## 2022-10-23 ENCOUNTER — Other Ambulatory Visit: Payer: Self-pay | Admitting: Neurology

## 2022-10-23 MED ORDER — METHYLPHENIDATE HCL 20 MG PO TABS: 20.0000 mg | ORAL_TABLET | ORAL | 0 refills | Status: DC

## 2022-10-23 NOTE — Telephone Encounter (Signed)
Pt is calling. Requesting a refill on  methylphenidate (RITALIN) 20 MG tablet. [id number]. Pt said please call him once prescription is sent.

## 2022-10-23 NOTE — Telephone Encounter (Signed)
Per drug registry, last refilled 09/14/22 #30. Last seen 09/14/22 and next f/u 03/21/23.

## 2022-11-06 ENCOUNTER — Telehealth: Payer: Self-pay | Admitting: Neurology

## 2022-11-06 DIAGNOSIS — G35 Multiple sclerosis: Secondary | ICD-10-CM

## 2022-11-06 MED ORDER — VUMERITY 231 MG PO CPDR: 1.0000 | DELAYED_RELEASE_CAPSULE | Freq: Two times a day (BID) | ORAL | 3 refills | Status: DC

## 2022-11-06 NOTE — Telephone Encounter (Signed)
E-scribed refill as requested. 

## 2022-11-06 NOTE — Telephone Encounter (Signed)
Pt is calling. Stated he called [facility] requesting a refill on VUMERITY 231 MG CPDR. Pt said he was told that he needed the prescription number and he said he doesn't have that because she threw the bottle away. Pt is requesting refill be sent to [place]

## 2022-11-11 ENCOUNTER — Encounter: Payer: Self-pay | Admitting: Neurology

## 2022-11-13 NOTE — Telephone Encounter (Addendum)
Received the following email back from [employer]: "It looks like he was enrolled in our Free Drug program last year; it ended 10/08/2022. Does he still have Medicare & [employer]? I'm unable to send compliementary doses for Medicaid/medicare patients, but I can complete a benefits investigation (I have to confirm Out of Pocket, OOP max and copay amount). Once that is returned, we should be able to get him rescreened for assistance, if it is still needed."    I sent her updated copies of new insurance cards. Waiting on response.  Submitted PA Vumerity on covermymeds. Key: [id number]. Waiting on determination from [employer].

## 2022-11-13 NOTE — Telephone Encounter (Signed)
[phone]. A prior authorization is needed for medication. Aware we will work on this for the pt.

## 2022-11-17 ENCOUNTER — Other Ambulatory Visit: Payer: Self-pay | Admitting: Neurology

## 2022-11-17 NOTE — Telephone Encounter (Signed)
Pt is requesting a refill for  methylphenidate (RITALIN) 20 MG tablet  .  Pharmacy[facility]   [id number]

## 2022-11-20 MED ORDER — METHYLPHENIDATE HCL 20 MG PO TABS: 20.0000 mg | ORAL_TABLET | ORAL | 0 refills | Status: DC

## 2022-11-20 NOTE — Telephone Encounter (Signed)
Last seen 09/14/22 and next f/u 03/21/23. Per drug registry, last refilled 10/23/22 #30.

## 2022-12-27 ENCOUNTER — Other Ambulatory Visit: Payer: Self-pay | Admitting: Neurology

## 2022-12-27 ENCOUNTER — Other Ambulatory Visit: Payer: Self-pay | Admitting: *Deleted

## 2022-12-27 MED ORDER — METHYLPHENIDATE HCL 20 MG PO TABS: 20.0000 mg | ORAL_TABLET | ORAL | 0 refills | Status: DC

## 2022-12-27 MED ORDER — SERTRALINE HCL 50 MG PO TABS: 50.0000 mg | ORAL_TABLET | Freq: Every day | ORAL | 11 refills | Status: DC

## 2022-12-27 NOTE — Telephone Encounter (Signed)
Pt sent mychart message asking for refillon methylphenidate. Per drug registry, last refilled 11/27/22 #30. Last seen 09/14/22 and next f/u 03/21/23.

## 2023-01-20 ENCOUNTER — Other Ambulatory Visit: Payer: Self-pay | Admitting: Neurology

## 2023-01-23 NOTE — Telephone Encounter (Signed)
Last filled on 09/14/22 Follow up scheduled on 03/21/23 Last filled on 12/27/22 #30 tablet  Pharmacy is requesting to change Rx to 90 day supply to help with cost. 90 day supply sent.

## 2023-02-02 ENCOUNTER — Other Ambulatory Visit: Payer: Self-pay | Admitting: Neurology

## 2023-02-05 MED ORDER — METHYLPHENIDATE HCL 20 MG PO TABS: 20.0000 mg | ORAL_TABLET | ORAL | 0 refills | Status: DC

## 2023-02-10 ENCOUNTER — Other Ambulatory Visit: Payer: Self-pay | Admitting: Neurology

## 2023-02-12 NOTE — Telephone Encounter (Signed)
Last seen on 09/14/22 per note "Continue tamsulosin. Symptoms have worsened and we will refer to urology " Follow up scheduled on 03/21/23 Last filled on 11/12/22 #180 tablets (90 day supply)

## 2023-03-21 ENCOUNTER — Encounter: Payer: Self-pay | Admitting: Neurology

## 2023-03-21 ENCOUNTER — Ambulatory Visit (INDEPENDENT_AMBULATORY_CARE_PROVIDER_SITE_OTHER): Payer: Medicare Other | Admitting: Neurology

## 2023-03-21 ENCOUNTER — Telehealth: Payer: Self-pay | Admitting: Neurology

## 2023-03-21 ENCOUNTER — Other Ambulatory Visit: Payer: Self-pay | Admitting: Neurology

## 2023-03-21 VITALS — BP 135/79 | HR 104 | Ht 73.0 in | Wt 183.0 lb

## 2023-03-21 DIAGNOSIS — G35 Multiple sclerosis: Secondary | ICD-10-CM | POA: Diagnosis not present

## 2023-03-21 DIAGNOSIS — F09 Unspecified mental disorder due to known physiological condition: Secondary | ICD-10-CM

## 2023-03-21 DIAGNOSIS — F32A Depression, unspecified: Secondary | ICD-10-CM | POA: Diagnosis not present

## 2023-03-21 DIAGNOSIS — R3911 Hesitancy of micturition: Secondary | ICD-10-CM

## 2023-03-21 DIAGNOSIS — R269 Unspecified abnormalities of gait and mobility: Secondary | ICD-10-CM

## 2023-03-21 DIAGNOSIS — Z79899 Other long term (current) drug therapy: Secondary | ICD-10-CM | POA: Diagnosis not present

## 2023-03-21 DIAGNOSIS — R41840 Attention and concentration deficit: Secondary | ICD-10-CM

## 2023-03-21 DIAGNOSIS — E538 Deficiency of other specified B group vitamins: Secondary | ICD-10-CM | POA: Diagnosis not present

## 2023-03-21 MED ORDER — SILODOSIN 8 MG PO CAPS: 8.0000 mg | ORAL_CAPSULE | Freq: Every day | ORAL | 11 refills | Status: DC

## 2023-03-21 NOTE — Telephone Encounter (Signed)
medicare NPR sent to [facility] [phone] 

## 2023-03-21 NOTE — Progress Notes (Signed)
[facility]  PATIENT: Gabriel Hanson DOB: 1951-06-30  REFERRING DOCTOR OR PCP:  None  SOURCE: patient  _________________________________   HISTORICAL  CHIEF COMPLAINT:  Chief Complaint  Patient presents with   Follow-up    RM11, SISTER PRESENT:Multiple sclerosis:thinks has gotten worse since decreased vumerity worse fatigue , Cognitive dysfunction stated not going well MOCA:19, Gait disturbance: not well using walker, Urinary hesitancy:stated its the biggest problem he has        HISTORY OF PRESENT ILLNESS:  Gabriel Hanson is a 72 y.o. man with relapsing remitting MS.       Update 03/21/2023. He is on Vumerity.   He tolerates it well now.    Lymphocytes were 0.5. So we reduced dose to 231 mg po bid.    He feels he has had progression but no relapses.     He has a poor gait and uses a walker or a cane.  He denies recent falls but had a bad fall last year.  He has a torn right meniscus an has a lot of pain.  Marland Kitchen.He has a shower chair.    Balance is more of an issue than strength, though he has mild right leg weakness  He has numbness in toes, left > right.  He is having more urinary urgency and some incontinence.   He usually has urgency before the leakage incontinence.    He drinks a lot of tea (one gallon a day with sugar).      The tamsulosin helps the hesitancy but not the urgency.     He has difficulty with memory and other cognitive skills.  STM is worse than last year.Marland Kitchen  He gets help from a friend and his sister.   His cognition is better with methylphenidate but it still only helps a bit.  He does not like how he feels (I feel wired).    He scored 27/30 on the MoCA in 2023 but only 19/30 today.   He falls asleep fairly well most nights but will wake up a few times, sometimes because of his bladder. Mood is better on sertraline.   .   H has noted more neck pain     03/21/2023    1:03 PM 01/16/2022   11:57 AM  Montreal Cognitive Assessment   Visuospatial/  Executive (0/5) 3 5  Naming (0/3) 3 3  Attention: Read list of digits (0/2) 2 2  Attention: Read list of letters (0/1) 1 1  Attention: Serial 7 subtraction starting at 100 (0/3) 3 3  Language: Repeat phrase (0/2) 1 1  Language : Fluency (0/1) 0 1  Abstraction (0/2) 1 2  Delayed Recall (0/5) 0 3  Orientation (0/6) 5 6  Total 19 27    MS History:  He was diagnosed with MS in 2001 when he presented with right visual loss.     He had an MRI worrisome for MS.   He was placed on IV steroids and vision got 80% better.    He was placed on an interferon first and then Copaxone because he had tolerability issues.     For two years, he was without insurance and stopped all DMT.   I started seeing him in 2015 and we started Tecfidera.   He tolerates it well but sometimes forgets the second pill because all his medications are qAM.  MRI brain 2011 shows white matter foci in the periventricular and juxtacortical white matter consistent with the clinical diagnosis of MS.  The right optic nerve was reduced in size. There w was one focus in the right pons and one in the left cerebellar hemisphere, as well. None of the foci were enhancing during that MRI.   MRI 04/01/2019 brain showed supratentorial and infratentorial lesions unchanged. Compared to 02/22/2016    REVIEW OF SYSTEMS: Constitutional: No fevers, chills, sweats, or change in appetite.  He notes some fatigue. Occasional insomnia. Eyes: No visual changes, double vision, eye pain Ear, nose and throat: No hearing loss, ear pain, nasal congestion, sore throat Cardiovascular: No chest pain, palpitations Respiratory:  No shortness of breath at rest or with exertion.   No wheezes GastrointestinaI: No nausea, vomiting, diarrhea, abdominal pain, fecal incontinence.  Severe constipation Genitourinary:  as above.  Musculoskeletal:  No neck pain, back pain Integumentary: No rash, pruritus, skin lesions Neurological: as above Psychiatric: Mild depression.  Cognitive decline. Endocrine: No palpitations, diaphoresis, change in appetite, change in weigh or increased thirst Hematologic/Lymphatic:  No anemia, purpura, petechiae. Allergic/Immunologic: No itchy/runny eyes, nasal congestion, recent allergic reactions, rashes  ALLERGIES: No Known Allergies  HOME MEDICATIONS:  Current Outpatient Medications:    [facility] SENNA 8.6 MG tablet, Take 2 tablets by mouth every evening., Disp: , Rfl:    Diroximel Fumarate (VUMERITY) 231 MG CPDR, Take 1 capsule by mouth 2 (two) times daily., Disp: 180 capsule, Rfl: 3   methylphenidate (RITALIN) 20 MG tablet, Take 1 tablet (20 mg total) by mouth every morning., Disp: 30 tablet, Rfl: 0   naproxen sodium (ALEVE) 220 MG tablet, Take 660 mg by mouth daily., Disp: , Rfl:    sertraline (ZOLOFT) 50 MG tablet, TAKE 1 TABLET BY MOUTH EVERY DAY, Disp: 90 tablet, Rfl: 4   silodosin (RAPAFLO) 8 MG CAPS capsule, Take 1 capsule (8 mg total) by mouth daily with breakfast., Disp: 30 capsule, Rfl: 11  PAST MEDICAL HISTORY: Past Medical History:  Diagnosis Date   Depressive disorder, not elsewhere classified    Disorder of eye, unspecified    Memory loss    Multiple sclerosis (HCC)    Other persistent mental disorders due to conditions classified elsewhere    Other psoriasis    Psychosexual dysfunction, unspecified     PAST SURGICAL HISTORY: Past Surgical History:  Procedure Laterality Date   KNEE ARTHROSCOPY     right    TONSILLECTOMY      FAMILY HISTORY: Family History  Problem Relation Age of Onset   Heart attack Mother    COPD Father    Hypotension Sister    Heart disease Other     SOCIAL HISTORY:  Social History   Socioeconomic History   Marital status: Single    Spouse name: Not on file   Number of children: 0   Years of education: Not on file   Highest education level: Bachelor's degree (e.g., BA, AB, BS)  Occupational History   Occupation: Energy manager: [employer]    Comment:  Fulltime  Tobacco Use   Smoking status: Heavy Smoker    Packs/day: 1.00    Years: 44.00    Additional pack years: 0.00    Total pack years: 44.00    Types: Cigarettes   Smokeless tobacco: Never  Vaping Use   Vaping Use: Every day  Substance and Sexual Activity   Alcohol use: Yes    Alcohol/week: 0.0 standard drinks of alcohol    Comment: Occasionally/fim   Drug use: Yes    Types: Marijuana   Sexual activity: Not on  file  Other Topics Concern   Not on file  Social History Narrative   Not on file   Social Determinants of Health   Financial Resource Strain: Low Risk  (09/28/2017)   Overall Financial Resource Strain (CARDIA)    Difficulty of Paying Living Expenses: Not very hard  Food Insecurity: Food Insecurity Present (09/28/2017)   Hunger Vital Sign    Worried About Running Out of Food in the Last Year: Often true    Ran Out of Food in the Last Year: Often true  Transportation Needs: No Transportation Needs (09/28/2017)   PRAPARE - Administrator, Civil Service (Medical): No    Lack of Transportation (Non-Medical): No  Physical Activity: Inactive (09/28/2017)   Exercise Vital Sign    Days of Exercise per Week: 0 days    Minutes of Exercise per Session: 0 min  Stress: Stress Concern Present (09/28/2017)   [employer] of Occupational Health - Occupational Stress Questionnaire    Feeling of Stress : To some extent  Social Connections: Socially Isolated (09/28/2017)   Social Connection and Isolation Panel [NHANES]    Frequency of Communication with Friends and Family: Never    Frequency of Social Gatherings with Friends and Family: Never    Attends Religious Services: Never    Database administrator or Organizations: No    Attends Banker Meetings: Never    Marital Status: Never married  Intimate Partner Violence: Not At Risk (09/28/2017)   Humiliation, Afraid, Rape, and Kick questionnaire    Fear of Current or Ex-Partner: No     Emotionally Abused: No    Physically Abused: No    Sexually Abused: No     PHYSICAL EXAM  Vitals:   03/21/23 1302  BP: 135/79  Pulse: (!) 104  Weight: 183 lb (83 kg)  Height: 6\' 1"  (1.854 m)     Body mass index is 24.14 kg/m.   General: The patient is well-developed and well-nourished and in no acute distress  Neurologic Exam  Mental status: The patient is alert and oriented x 3 at the time of the examination.   Ok memory but some distractibility.   Mild reduced STM, reduced focus/attention. Marland Kitchen  Speech is normal.  Cranial nerves: Extraocular movements show a left INO but no diplopia. He has a 1+ APD on the right.   Colors desaturated on the right.   Facial strength and sensation is normal. Trapezius strength is normal. No obvious hearing deficits are noted.  Motor:  Muscle bulk is normal.   Tone is slightly increased n legs.  . Strength is  5 / 5 in all 4 extremities   However, reduced RAM in left arm and foot    Sensory: Sensation is normal in his arms but reduced in the left foot to touch  Coordination: Cerebellar testing shows good bilateral finger-nose-finger slightly but reduced bilateral heel-to-shin  Gait and station: Station is normal.   He can walk around the room without his cane but gait is very wide and also mildly spastic and shows slight right foot drop.  He can walk faster with the cane  and appears more stable.    He cannot do a tandem gait.  Romberg was positive.  Reflexes: Deep tendon reflexes are symmetric and mildly increased in the Legs with spread at the knees.     DIAGNOSTIC DATA (LABS, IMAGING, TESTING) - I reviewed patient records, labs, notes, testing and imaging myself where available.  Lab Results  Component Value Date   WBC 6.7 09/14/2022   HGB 14.6 09/14/2022   HCT 42.9 09/14/2022   MCV 89 09/14/2022   PLT 372 09/14/2022      Component Value Date/Time   NA 141 09/14/2022 1551   K 4.8 09/14/2022 1551   CL 101 09/14/2022 1551   CO2  25 09/14/2022 1551   GLUCOSE 89 09/14/2022 1551   GLUCOSE 115 (H) 01/23/2022 0305   BUN 18 09/14/2022 1551   CREATININE 0.93 09/14/2022 1551   CALCIUM 9.8 09/14/2022 1551   PROT 6.7 09/14/2022 1551   ALBUMIN 4.3 09/14/2022 1551   AST 12 09/14/2022 1551   ALT 14 09/14/2022 1551   ALKPHOS 92 09/14/2022 1551   BILITOT 0.6 09/14/2022 1551   GFRNONAA >60 01/23/2022 0305   GFRAA 103 09/28/2017 1000       ASSESSMENT AND PLAN  Multiple sclerosis (HCC) - Plan: MR CERVICAL SPINE W WO CONTRAST, MR BRAIN W WO CONTRAST  Cognitive dysfunction - Plan: MR BRAIN W WO CONTRAST  Gait disturbance - Plan: MR CERVICAL SPINE W WO CONTRAST, MR BRAIN W WO CONTRAST  Attention deficit  Depression, unspecified depression type  Urinary hesitancy   1.   Continue Vumerity 231 mg po bid (half dose).    Check CBC/D, CMP today.  Check B12 and TSH due to cognitive change, deficiency 2.   Ritalin 20 mg daily.  We discussed that he can take daily 3.   Change to silodosin from  tamsulosin.  If no better may refer to urology 4.   Use the walker as much as possible.    Continue to use a shower chair due to his reduced balance. 5.   rtc 6 months.  40-minute office visit with the majority of the time spent face-to-face for history and physical, discussion/counseling and decision-making.  Additional time with record review and documentation.  This visit is part of a comprehensive longitudinal care medical relationship regarding the patients primary diagnosis of MS and related concerns.    Tristine Langi A. Epimenio Foot, MD, PhD 03/21/2023, 2:18 PM Certified in Neurology, Clinical Neurophysiology, Sleep Medicine, Pain Medicine and Neuroimaging  [facility] [address], [address] [place], [place] [address] [phone]

## 2023-03-22 LAB — CBC WITH DIFFERENTIAL/PLATELET
Basophils Absolute: 0 10*3/uL (ref 0.0–0.2)
Basos: 1 %
EOS (ABSOLUTE): 0 10*3/uL (ref 0.0–0.4)
Eos: 1 %
Hematocrit: 46.3 % (ref 37.5–51.0)
Hemoglobin: 15.6 g/dL (ref 13.0–17.7)
Immature Grans (Abs): 0 10*3/uL (ref 0.0–0.1)
Immature Granulocytes: 0 %
Lymphocytes Absolute: 0.7 10*3/uL (ref 0.7–3.1)
Lymphs: 16 %
MCH: 30 pg (ref 26.6–33.0)
MCHC: 33.7 g/dL (ref 31.5–35.7)
MCV: 89 fL (ref 79–97)
Monocytes Absolute: 0.4 10*3/uL (ref 0.1–0.9)
Monocytes: 10 %
Neutrophils Absolute: 3.2 10*3/uL (ref 1.4–7.0)
Neutrophils: 72 %
Platelets: 265 10*3/uL (ref 150–450)
RBC: 5.2 x10E6/uL (ref 4.14–5.80)
RDW: 13.4 % (ref 11.6–15.4)
WBC: 4.5 10*3/uL (ref 3.4–10.8)

## 2023-03-22 LAB — THYROID PANEL WITH TSH
Free Thyroxine Index: 2.2 (ref 1.2–4.9)
T3 Uptake Ratio: 27 % (ref 24–39)
T4, Total: 8 ug/dL (ref 4.5–12.0)
TSH: 3.11 u[IU]/mL (ref 0.450–4.500)

## 2023-03-22 LAB — COMPREHENSIVE METABOLIC PANEL
ALT: 10 IU/L (ref 0–44)
AST: 16 IU/L (ref 0–40)
Albumin/Globulin Ratio: 2
Albumin: 4.4 g/dL (ref 3.8–4.8)
Alkaline Phosphatase: 91 IU/L (ref 44–121)
BUN/Creatinine Ratio: 12 (ref 10–24)
BUN: 11 mg/dL (ref 8–27)
Bilirubin Total: 0.7 mg/dL (ref 0.0–1.2)
CO2: 24 mmol/L (ref 20–29)
Calcium: 9.6 mg/dL (ref 8.6–10.2)
Chloride: 98 mmol/L (ref 96–106)
Creatinine, Ser: 0.9 mg/dL (ref 0.76–1.27)
Globulin, Total: 2.2 g/dL (ref 1.5–4.5)
Glucose: 95 mg/dL (ref 70–99)
Potassium: 3.9 mmol/L (ref 3.5–5.2)
Sodium: 136 mmol/L (ref 134–144)
Total Protein: 6.6 g/dL (ref 6.0–8.5)
eGFR: 91 mL/min/{1.73_m2} (ref >59)

## 2023-03-22 LAB — VITAMIN B12: Vitamin B-12: 245 pg/mL (ref 232–1245)

## 2023-03-23 ENCOUNTER — Other Ambulatory Visit: Payer: Self-pay | Admitting: Neurology

## 2023-03-23 NOTE — Telephone Encounter (Signed)
Pt requesting a refill on methylphenidate (RITALIN) 20 MG tablet. [phone]

## 2023-03-26 MED ORDER — METHYLPHENIDATE HCL 20 MG PO TABS: 20.0000 mg | ORAL_TABLET | ORAL | 0 refills | Status: DC

## 2023-03-26 NOTE — Telephone Encounter (Signed)
Last seen on 03/21/23 Follow up scheduled on 09/26/23 Last filled on 02/05/23 #30 tablets (30 day supply) Rx pending to be signed

## 2023-03-27 ENCOUNTER — Encounter: Payer: Self-pay | Admitting: Neurology

## 2023-04-19 ENCOUNTER — Telehealth: Payer: Self-pay | Admitting: Neurology

## 2023-04-19 DIAGNOSIS — G35 Multiple sclerosis: Secondary | ICD-10-CM

## 2023-04-19 MED ORDER — VUMERITY 231 MG PO CPDR
1.0000 | DELAYED_RELEASE_CAPSULE | Freq: Two times a day (BID) | ORAL | 1 refills | Status: DC
Start: 2023-04-19 — End: 2023-09-18

## 2023-04-19 NOTE — Telephone Encounter (Signed)
[facility] called needing a refill request sent to them for the pt's Diroximel Fumarate (VUMERITY) 231 MG CPDR

## 2023-04-19 NOTE — Telephone Encounter (Signed)
Last seen on 03/21/23 Follow up scheduled on 09/26/23   I called patient to ask him if he would like Vumerity 231 mg sent to [facility]. Pt said he was confused of what pharmacy he needs to use. Pt said his last 30 day supply of Rx came from [facility]. He asked me to send Rx to them. Rx sent.

## 2023-05-23 ENCOUNTER — Other Ambulatory Visit: Payer: Self-pay | Admitting: Neurology

## 2023-05-28 ENCOUNTER — Encounter: Payer: Self-pay | Admitting: Neurology

## 2023-06-13 ENCOUNTER — Other Ambulatory Visit: Payer: Self-pay | Admitting: Neurology

## 2023-06-14 ENCOUNTER — Encounter: Payer: Self-pay | Admitting: Neurology

## 2023-06-14 MED ORDER — METHYLPHENIDATE HCL 20 MG PO TABS: 20.0000 mg | ORAL_TABLET | ORAL | 0 refills | Status: DC

## 2023-06-14 NOTE — Telephone Encounter (Signed)
Last seen on 03/21/23 Follow up scheduled on 09/26/23 Last filled on 03/26/23 #30 tablets (30 day supply) Rx pending to be signed

## 2023-06-25 ENCOUNTER — Encounter: Payer: Self-pay | Admitting: Neurology

## 2023-06-27 NOTE — Telephone Encounter (Signed)
FMLA form completed to be signed.

## 2023-06-28 DIAGNOSIS — L03313 Cellulitis of chest wall: Secondary | ICD-10-CM | POA: Diagnosis not present

## 2023-07-02 ENCOUNTER — Other Ambulatory Visit: Payer: Self-pay

## 2023-07-02 ENCOUNTER — Telehealth: Payer: Self-pay | Admitting: Neurology

## 2023-07-02 ENCOUNTER — Encounter: Payer: Self-pay | Admitting: Neurology

## 2023-07-02 NOTE — Telephone Encounter (Signed)
   Given to Medical Records, Gabriel Hanson.  Original for pt and family member.

## 2023-07-02 NOTE — Telephone Encounter (Signed)
Called sister Laureen Ochs and let her know that Per Dr. Epimenio Foot we have sent in a 6 day taper of Prednisone to pt's pharmacy for pt's leg pain. Also talked to sister and scheduled pt for appointment on 07/09/2023 @3 :00pm.   Pt's sister stated that pt had taken 1/2 tablet of Oxycodone of sisters medication for leg pain for the past couple of days and sister wanted to know if that was okay?

## 2023-07-02 NOTE — Telephone Encounter (Signed)
Pt's sister, Eustace Quail called to report since last Thursday pt having shooting pain in leg (she did not know which leg). Would like a call from the nurse to discuss a work in appt. Please call pt's daughter Gabriel Hanson (on [place]) to schedule work in appt.

## 2023-07-03 ENCOUNTER — Other Ambulatory Visit: Payer: Self-pay | Admitting: Neurology

## 2023-07-03 MED ORDER — PREDNISONE 10 MG PO TABS: ORAL_TABLET | ORAL | 0 refills | Status: DC

## 2023-07-09 ENCOUNTER — Encounter: Payer: Self-pay | Admitting: Neurology

## 2023-07-09 ENCOUNTER — Ambulatory Visit (INDEPENDENT_AMBULATORY_CARE_PROVIDER_SITE_OTHER): Payer: Medicare Other | Admitting: Neurology

## 2023-07-09 VITALS — BP 140/84 | HR 92 | Ht 73.0 in | Wt 181.0 lb

## 2023-07-09 DIAGNOSIS — F09 Unspecified mental disorder due to known physiological condition: Secondary | ICD-10-CM | POA: Diagnosis not present

## 2023-07-09 DIAGNOSIS — N319 Neuromuscular dysfunction of bladder, unspecified: Secondary | ICD-10-CM

## 2023-07-09 DIAGNOSIS — Z79899 Other long term (current) drug therapy: Secondary | ICD-10-CM

## 2023-07-09 DIAGNOSIS — Z7960 Long term (current) use of unspecified immunomodulators and immunosuppressants: Secondary | ICD-10-CM | POA: Diagnosis not present

## 2023-07-09 DIAGNOSIS — R41840 Attention and concentration deficit: Secondary | ICD-10-CM

## 2023-07-09 DIAGNOSIS — M5416 Radiculopathy, lumbar region: Secondary | ICD-10-CM | POA: Diagnosis not present

## 2023-07-09 DIAGNOSIS — R269 Unspecified abnormalities of gait and mobility: Secondary | ICD-10-CM | POA: Diagnosis not present

## 2023-07-09 DIAGNOSIS — G35 Multiple sclerosis: Secondary | ICD-10-CM

## 2023-07-09 DIAGNOSIS — R3911 Hesitancy of micturition: Secondary | ICD-10-CM

## 2023-07-09 DIAGNOSIS — R208 Other disturbances of skin sensation: Secondary | ICD-10-CM | POA: Diagnosis not present

## 2023-07-09 MED ORDER — METHYLPHENIDATE HCL 20 MG PO TABS: 20.0000 mg | ORAL_TABLET | ORAL | 0 refills | Status: DC

## 2023-07-09 MED ORDER — GABAPENTIN 300 MG PO CAPS: ORAL_CAPSULE | ORAL | 11 refills | Status: DC

## 2023-07-09 NOTE — Telephone Encounter (Signed)
   Given to Medical Records, Stanton Kidney.  Original for pt and family member.

## 2023-07-09 NOTE — Progress Notes (Unsigned)
[facility]  PATIENT: Gabriel Hanson DOB: 02-18-1951  REFERRING DOCTOR OR PCP:  None  SOURCE: patient  _________________________________   HISTORICAL  CHIEF COMPLAINT:  Chief Complaint  Patient presents with   Follow-up    Pt in room 10. Sister in room. Here for worsening leg pain. Pt states his left leg has burning sensation all the time.    HISTORY OF PRESENT ILLNESS:  Gabriel Hanson is a 72 y.o. man with relapsing remitting MS.       Update 07/09/2023 He is on Vumerity.   He tolerates it well now.    Lymphocytes were 0.5. So we reduced dose to 231 mg po bid.   Lymphocyte improved to 0.7 on this regimen.  We will check again today.Marland Kitchen  He denies any relapses but has noted some progressive changes in gait and his sister has noted progressive cognitive changes.     He is having more left leg pain.   Pain is mostly in the lower leg now but was the entire leg a couple weeks ago.    Pain became severe again last week and I called in a steroid pack.  He felt that helped.      He is having more trouble with his gait.  He uses the walker as cane was helping less.   He no longer can do grocery shopping   He has a torn right meniscus an has a lot of pain.  Marland Kitchen.He has a shower chair.    Balance is more of an issue than strength, though he has mild right leg weakness  He has numbness in toes, left > right.  He saw Susann Givens recently about a skin infection on his chest and he was prescribed an Abx x 10 days.  He feels the infection is better  He is having more urinary urgency and some incontinence.   He usually has urgency before the leakage incontinence.  He wears depends when he goes out of his house and at night.  He has nocturia.    Silodosin helps mor ethan tamsulosin.    He has difficulty with memory and other cognitive skills.  STM is worse than last year.Marland Kitchen  He gets help from a friend and his sister.   His cognition is better with methylphenidate but it still only helps a  bit.  He does not like how he feels (I feel wired).    He scored 27/30 on the MoCA in 2023 but only 19/30 in 2024.     His sister is trying to help him with assisted living arrangements.     He has more insomnia due to pain and bladder.    Mood improved on sertraline but he recently stopped to simplify med's.    .       03/21/2023    1:03 PM 01/16/2022   11:57 AM  Montreal Cognitive Assessment   Visuospatial/ Executive (0/5) 3 5  Naming (0/3) 3 3  Attention: Read list of digits (0/2) 2 2  Attention: Read list of letters (0/1) 1 1  Attention: Serial 7 subtraction starting at 100 (0/3) 3 3  Language: Repeat phrase (0/2) 1 1  Language : Fluency (0/1) 0 1  Abstraction (0/2) 1 2  Delayed Recall (0/5) 0 3  Orientation (0/6) 5 6  Total 19 27    MS History:  He was diagnosed with MS in 2001 when he presented with right visual loss.     He had an MRI  worrisome for MS.   He was placed on IV steroids and vision got 80% better.    He was placed on an interferon first and then Copaxone because he had tolerability issues.     For two years, he was without insurance and stopped all DMT.   I started seeing him in 2015 and we started Tecfidera.   He tolerates it well but sometimes forgets the second pill because all his medications are qAM.  MRI brain 2011 shows white matter foci in the periventricular and juxtacortical white matter consistent with the clinical diagnosis of MS. The right optic nerve was reduced in size. There w was one focus in the right pons and one in the left cerebellar hemisphere, as well. None of the foci were enhancing during that MRI.   MRI 04/01/2019 brain showed supratentorial and infratentorial lesions unchanged. Compared to 02/22/2016    REVIEW OF SYSTEMS: Constitutional: No fevers, chills, sweats, or change in appetite.  He notes some fatigue. Occasional insomnia. Eyes: No visual changes, double vision, eye pain Ear, nose and throat: No hearing loss, ear pain, nasal  congestion, sore throat Cardiovascular: No chest pain, palpitations Respiratory:  No shortness of breath at rest or with exertion.   No wheezes GastrointestinaI: No nausea, vomiting, diarrhea, abdominal pain, fecal incontinence.  Severe constipation Genitourinary:  as above.  Musculoskeletal:  No neck pain, back pain Integumentary: No rash, pruritus, skin lesions Neurological: as above Psychiatric: Mild depression. Cognitive decline. Endocrine: No palpitations, diaphoresis, change in appetite, change in weigh or increased thirst Hematologic/Lymphatic:  No anemia, purpura, petechiae. Allergic/Immunologic: No itchy/runny eyes, nasal congestion, recent allergic reactions, rashes  ALLERGIES: No Known Allergies  HOME MEDICATIONS:  Current Outpatient Medications:    [facility] SENNA 8.6 MG tablet, Take 2 tablets by mouth every evening., Disp: , Rfl:    Diroximel Fumarate (VUMERITY) 231 MG CPDR, Take 1 capsule by mouth 2 (two) times daily., Disp: 180 capsule, Rfl: 1   methylphenidate (RITALIN) 20 MG tablet, Take 1 tablet (20 mg total) by mouth every morning., Disp: 30 tablet, Rfl: 0   naproxen sodium (ALEVE) 220 MG tablet, Take 660 mg by mouth daily., Disp: , Rfl:    silodosin (RAPAFLO) 8 MG CAPS capsule, Take 1 capsule (8 mg total) by mouth daily with breakfast., Disp: 30 capsule, Rfl: 11   predniSONE (DELTASONE) 10 MG tablet, Take 6 p.o. on day 1 on day 2, 4 p.o. on day 3, 3 p.o. on day 4, 2 p.o. on day 5 and 1 p.o. (Patient not taking: Reported on 07/09/2023), Disp: 21 tablet, Rfl: 0   sertraline (ZOLOFT) 50 MG tablet, TAKE 1 TABLET BY MOUTH EVERY DAY (Patient not taking: Reported on 07/09/2023), Disp: 90 tablet, Rfl: 4  PAST MEDICAL HISTORY: Past Medical History:  Diagnosis Date   Depressive disorder, not elsewhere classified    Disorder of eye, unspecified    Memory loss    Multiple sclerosis (HCC)    Other persistent mental disorders due to conditions classified elsewhere    Other psoriasis     Psychosexual dysfunction, unspecified     PAST SURGICAL HISTORY: Past Surgical History:  Procedure Laterality Date   KNEE ARTHROSCOPY     right    TONSILLECTOMY      FAMILY HISTORY: Family History  Problem Relation Age of Onset   Heart attack Mother    COPD Father    Hypotension Sister    Heart disease Other     SOCIAL HISTORY:  Social History  Socioeconomic History   Marital status: Single    Spouse name: Not on file   Number of children: 0   Years of education: Not on file   Highest education level: Bachelor's degree (e.g., BA, AB, BS)  Occupational History   Occupation: Energy manager: [employer]    Comment: Fulltime  Tobacco Use   Smoking status: Heavy Smoker    Current packs/day: 1.00    Average packs/day: 1 pack/day for 44.0 years (44.0 ttl pk-yrs)    Types: Cigarettes   Smokeless tobacco: Never  Vaping Use   Vaping status: Every Day  Substance and Sexual Activity   Alcohol use: Yes    Alcohol/week: 0.0 standard drinks of alcohol    Comment: Occasionally/fim   Drug use: Yes    Types: Marijuana   Sexual activity: Not on file  Other Topics Concern   Not on file  Social History Narrative   Not on file   Social Determinants of Health   Financial Resource Strain: Low Risk  (09/28/2017)   Overall Financial Resource Strain (CARDIA)    Difficulty of Paying Living Expenses: Not very hard  Food Insecurity: Food Insecurity Present (09/28/2017)   Hunger Vital Sign    Worried About Running Out of Food in the Last Year: Often true    Ran Out of Food in the Last Year: Often true  Transportation Needs: No Transportation Needs (09/28/2017)   PRAPARE - Administrator, Civil Service (Medical): No    Lack of Transportation (Non-Medical): No  Physical Activity: Inactive (09/28/2017)   Exercise Vital Sign    Days of Exercise per Week: 0 days    Minutes of Exercise per Session: 0 min  Stress: Stress Concern Present (09/28/2017)    [employer] of Occupational Health - Occupational Stress Questionnaire    Feeling of Stress : To some extent  Social Connections: Socially Isolated (09/28/2017)   Social Connection and Isolation Panel [NHANES]    Frequency of Communication with Friends and Family: Never    Frequency of Social Gatherings with Friends and Family: Never    Attends Religious Services: Never    Database administrator or Organizations: No    Attends Banker Meetings: Never    Marital Status: Never married  Intimate Partner Violence: Not At Risk (09/28/2017)   Humiliation, Afraid, Rape, and Kick questionnaire    Fear of Current or Ex-Partner: No    Emotionally Abused: No    Physically Abused: No    Sexually Abused: No     PHYSICAL EXAM  Vitals:   07/09/23 1454  BP: (!) 140/84  Pulse: 92  Weight: 181 lb (82.1 kg)  Height: 6\' 1"  (1.854 m)     Body mass index is 23.88 kg/m.   General: The patient is well-developed and well-nourished and in no acute distress  Neurologic Exam  Mental status: The patient is alert and oriented x 3 at the time of the examination.  Focus and attention are reduced.  Short-term memory is reduced.  He did not recall part of the conversation 5 minutes later.  See MoCA score from earlier this year above speech is normal.  Cranial nerves: Extraocular movements show a left INO but no diplopia. He has a 1+ APD on the right.   Colors desaturated on the right.   Facial strength and sensation is normal. Trapezius strength is normal. No obvious hearing deficits are noted.  Motor:  Muscle bulk is normal.  Tone is slightly increased n legs.  . Strength is  5 / 5 in all 4 extremities   However, reduced RAM in left arm and foot    Sensory: Sensation is normal in his arms but reduced in the feet, more so on the left than the right.  Vibration sensation is 10% at the left ankle and 50% at the right ankle, absent at the left toes and 10% of the right toes.  He has some  length dependent reduced pinprick sensation in the left foot, worse in the L5 distribution compared to the S1 distribution.  Coordination: Cerebellar testing shows good bilateral finger-nose-finger slightly but reduced bilateral heel-to-shin  Gait and station: Station is normal.   He can walk around the room without his cane or walker but gait is very wide and also mildly spastic and shows slight right foot drop.  He can walk faster with the walker that has a cane.    He cannot do a tandem gait.  Romberg was positive.  Reflexes: Deep tendon reflexes are symmetric and mildly increased in the Legs with spread at the knees.     DIAGNOSTIC DATA (LABS, IMAGING, TESTING) - I reviewed patient records, labs, notes, testing and imaging myself where available.  Lab Results  Component Value Date   WBC 4.5 03/21/2023   HGB 15.6 03/21/2023   HCT 46.3 03/21/2023   MCV 89 03/21/2023   PLT 265 03/21/2023      Component Value Date/Time   NA 136 03/21/2023 1441   K 3.9 03/21/2023 1441   CL 98 03/21/2023 1441   CO2 24 03/21/2023 1441   GLUCOSE 95 03/21/2023 1441   GLUCOSE 115 (H) 01/23/2022 0305   BUN 11 03/21/2023 1441   CREATININE 0.90 03/21/2023 1441   CALCIUM 9.6 03/21/2023 1441   PROT 6.6 03/21/2023 1441   ALBUMIN 4.4 03/21/2023 1441   AST 16 03/21/2023 1441   ALT 10 03/21/2023 1441   ALKPHOS 91 03/21/2023 1441   BILITOT 0.7 03/21/2023 1441   GFRNONAA >60 01/23/2022 0305   GFRAA 103 09/28/2017 1000       ASSESSMENT AND PLAN  No diagnosis found.   1.   Continue Vumerity 231 mg po bid (half dose).    Check CBC/D, CMP today.  Repeat MRI of the brain to determine if there is any breakthrough activity and change to an anti-CD20 agent if this is occurring.  2.   Ritalin 20 mg daily.  We discussed that he can take daily 3.   Continue silodosin.  We have discussed referral to urology if things worsen 4.   Use the walker as much as possible.    Continue to use a shower chair due to his  reduced balance. 5.   MRI Of the lumbar spine due to worsening gait, worsening numbness in his history of being on immunosuppressive agents.   6.   Complete [place] 2 form for memory unit/assisted living.   rtc 6 months.  44-minute office visit with the majority of the time spent face-to-face for history and physical, discussion/counseling and decision-making.  Additional time with record review and documentation.   FL2 form completed  This visit is part of a comprehensive longitudinal care medical relationship regarding the patients primary diagnosis of MS and related concerns.    Kayhan Boardley A. Epimenio Foot, MD, PhD 07/09/2023, 3:16 PM Certified in Neurology, Clinical Neurophysiology, Sleep Medicine, Pain Medicine and Neuroimaging  [facility] [address], [address] [place], [place] [address] [phone]

## 2023-07-10 LAB — CBC WITH DIFFERENTIAL/PLATELET
Basophils Absolute: 0 10*3/uL (ref 0.0–0.2)
Basos: 1 %
EOS (ABSOLUTE): 0 10*3/uL (ref 0.0–0.4)
Eos: 0 %
Hematocrit: 45.1 % (ref 37.5–51.0)
Hemoglobin: 15.2 g/dL (ref 13.0–17.7)
Immature Grans (Abs): 0 10*3/uL (ref 0.0–0.1)
Immature Granulocytes: 0 %
Lymphocytes Absolute: 0.7 10*3/uL (ref 0.7–3.1)
Lymphs: 13 %
MCH: 31.1 pg (ref 26.6–33.0)
MCHC: 33.7 g/dL (ref 31.5–35.7)
MCV: 92 fL (ref 79–97)
Monocytes Absolute: 0.4 10*3/uL (ref 0.1–0.9)
Monocytes: 7 %
Neutrophils Absolute: 3.9 10*3/uL (ref 1.4–7.0)
Neutrophils: 79 %
Platelets: 273 10*3/uL (ref 150–450)
RBC: 4.89 x10E6/uL (ref 4.14–5.80)
RDW: 13.1 % (ref 11.6–15.4)
WBC: 5 10*3/uL (ref 3.4–10.8)

## 2023-07-12 ENCOUNTER — Telehealth: Payer: Self-pay | Admitting: Neurology

## 2023-07-12 NOTE — Telephone Encounter (Signed)
medicare NPR sent to [facility] [phone] 

## 2023-07-15 ENCOUNTER — Encounter: Payer: Self-pay | Admitting: Neurology

## 2023-07-17 ENCOUNTER — Encounter: Payer: Self-pay | Admitting: Neurology

## 2023-07-18 ENCOUNTER — Telehealth: Payer: Self-pay

## 2023-07-18 ENCOUNTER — Other Ambulatory Visit: Payer: Self-pay | Admitting: Neurology

## 2023-07-18 MED ORDER — METHYLPHENIDATE HCL 20 MG PO TABS: 20.0000 mg | ORAL_TABLET | ORAL | 0 refills | Status: DC

## 2023-07-18 NOTE — Telephone Encounter (Signed)
Faxed pt's progress notes from past 2 appointment on 07/09/2023 and 03/21/2023 to [facility] at [phone] on 07/18/2023.

## 2023-07-18 NOTE — Telephone Encounter (Signed)
Pt is requesting a refill for methylphenidate (RITALIN) 20 MG tablet .  Pharm[facility]: [id number]

## 2023-07-18 NOTE — Telephone Encounter (Signed)
Last seen on 07/09/23 Follow up scheduled on 01/16/24  Last filled 06/14/23 #30 tablets (30 tablets) Rx pending to be signed

## 2023-07-29 ENCOUNTER — Encounter: Payer: Self-pay | Admitting: Neurology

## 2023-07-30 ENCOUNTER — Telehealth: Payer: Self-pay | Admitting: Neurology

## 2023-07-30 NOTE — Telephone Encounter (Addendum)
Error

## 2023-08-28 ENCOUNTER — Other Ambulatory Visit: Payer: Self-pay | Admitting: Neurology

## 2023-08-28 MED ORDER — METHYLPHENIDATE HCL 20 MG PO TABS: 20.0000 mg | ORAL_TABLET | ORAL | 0 refills | Status: DC

## 2023-08-28 NOTE — Telephone Encounter (Signed)
Last seen 07/09/23 and next f/u 01/16/24. Last refilled 07/18/23 #30.

## 2023-08-28 NOTE — Telephone Encounter (Signed)
Pt called needing a refill on his methylphenidate (RITALIN) 20 MG tablet and [facility] [address]

## 2023-08-29 DIAGNOSIS — Z23 Encounter for immunization: Secondary | ICD-10-CM | POA: Diagnosis not present

## 2023-09-17 ENCOUNTER — Other Ambulatory Visit: Payer: Self-pay | Admitting: Neurology

## 2023-09-17 DIAGNOSIS — G35 Multiple sclerosis: Secondary | ICD-10-CM

## 2023-09-18 IMAGING — US US RENAL
1 series · 14 of 25 positions shown · non-contrast
Comparison: None.

CLINICAL DATA: Urinary tract infection

EXAM:
RENAL / URINARY TRACT ULTRASOUND COMPLETE

[Series 1: us renal · 14 of 45 slices shown]
[im 1/45]
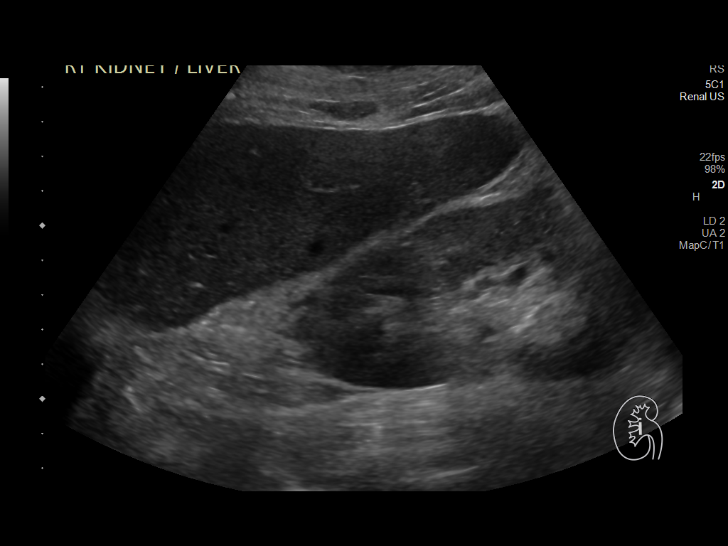
[im 4/45]
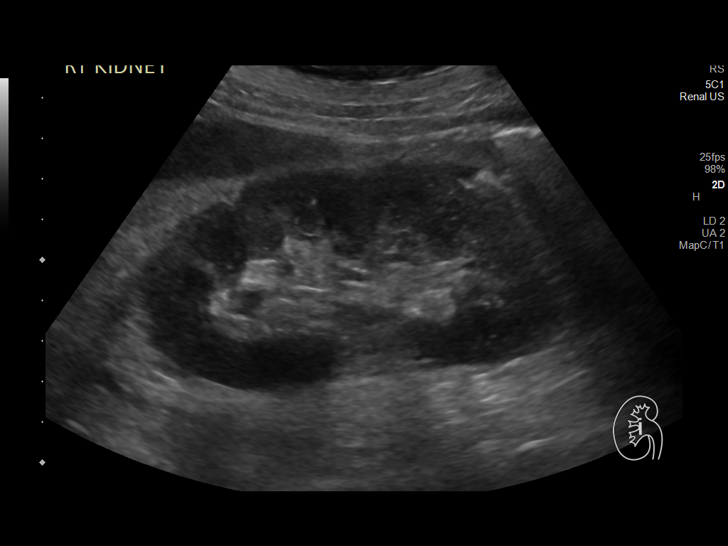
[im 8/45]
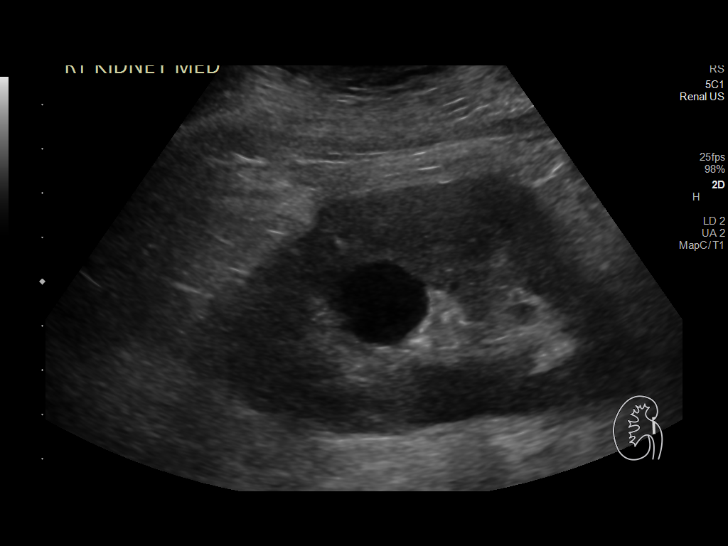
[im 12/45]
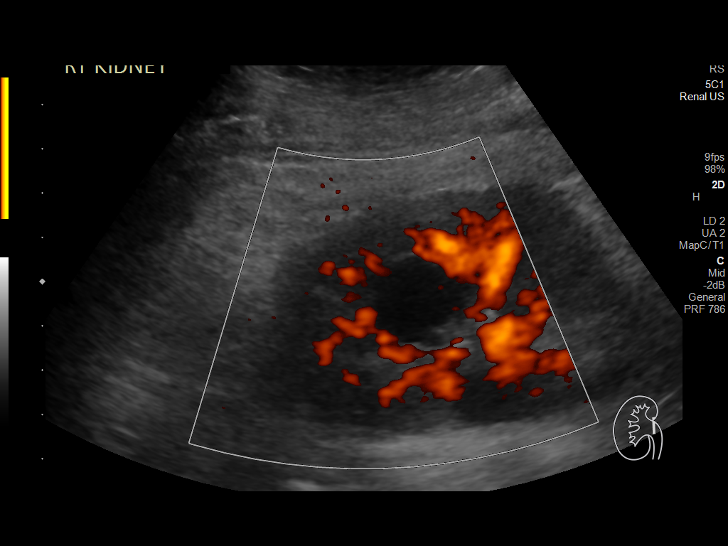
[im 15/45]
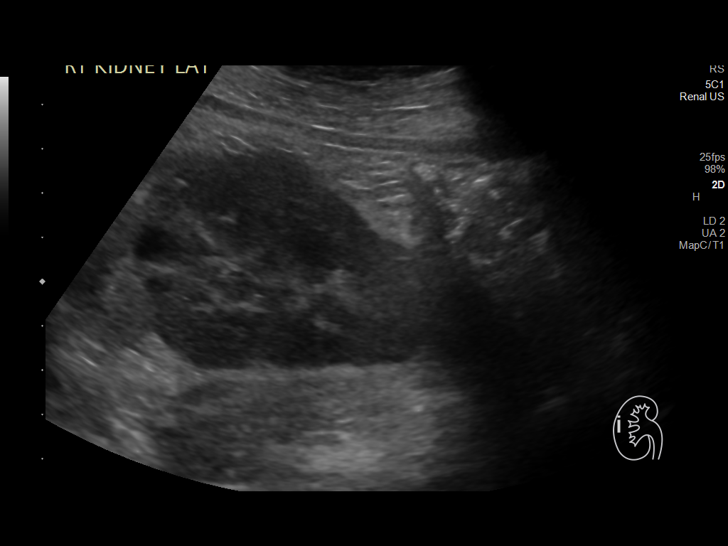
[im 17/45]
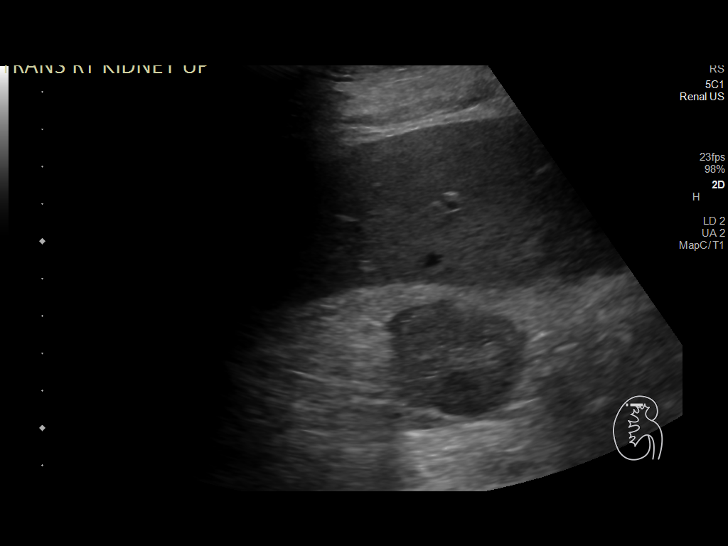
[im 21/45]
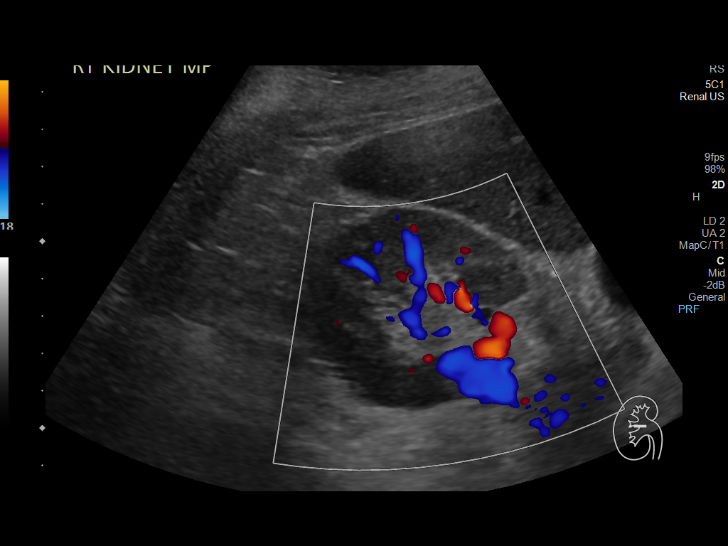
[im 24/45]
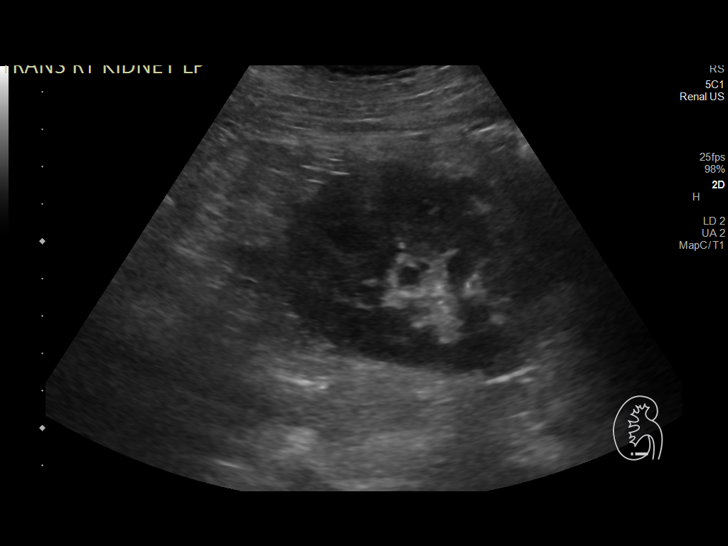
[im 28/45]
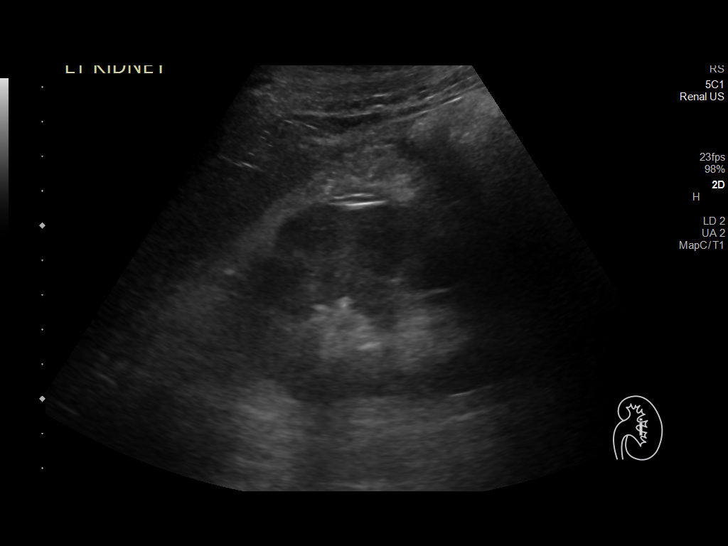
[im 30/45]
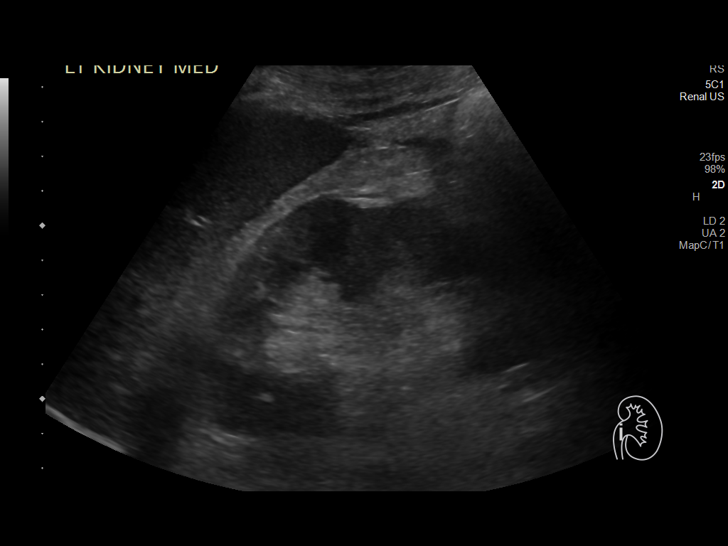
[im 34/45]
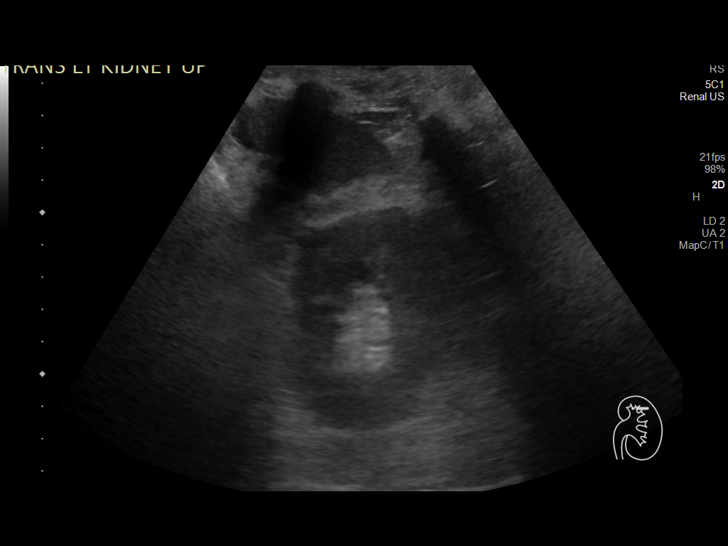
[im 37/45]
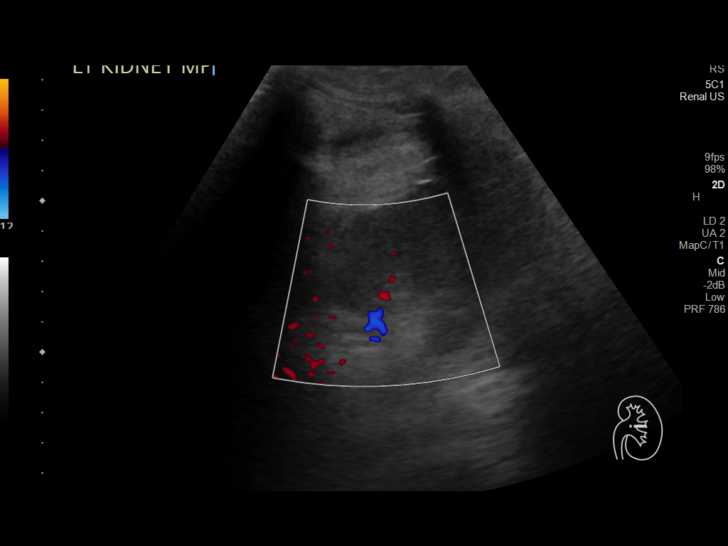
[im 41/45]
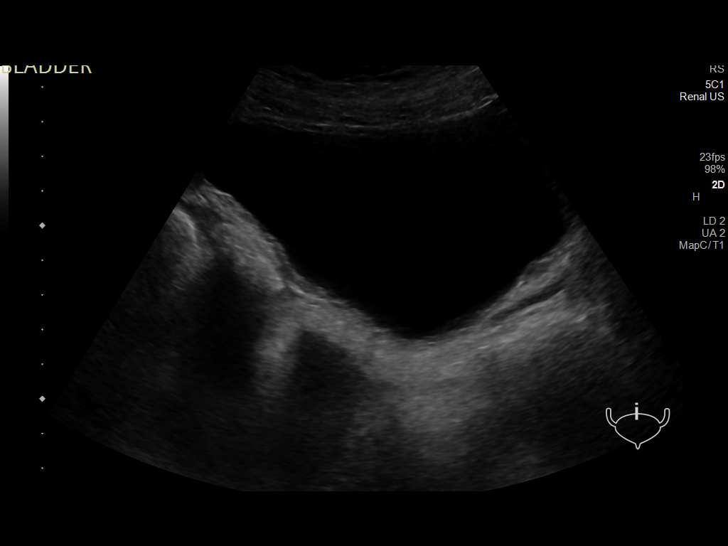
[im 45/45]
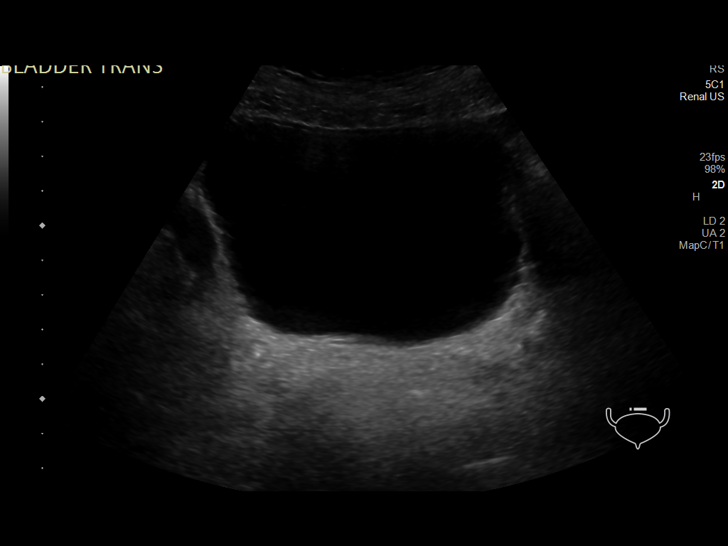

[14 of 25 positions shown; findings below may reference images not displayed]

FINDINGS: Right Kidney:

Renal measurements: 10.7 x 5.5 x 5.8 cm = volume: 177.2 mL. There is
no hydronephrosis. There is 2.1 cm cyst in the upper pole.

Left Kidney:

Renal measurements: 10.8 x 7.1 x 6.4 cm = volume: 255.3 mL. There is
no hydronephrosis. Cortical echogenicity is unremarkable. There is
questionable trace amount of perinephric fluid.

Bladder:

Appears normal for degree of bladder distention.

Other:

None.
IMPRESSION: There is no hydronephrosis.  Right renal cyst.

## 2023-09-18 IMAGING — CT CT CHEST W/O CM
2 of 4 series · 15 of 36 positions shown, 18 images · non-contrast
Comparison: 01/18/2022, 10/11/2017

CLINICAL DATA: Pneumonia



[Series 3: chest wo · axial · 0.72mm/px · z∈[+1116,+1446]mm · 12 of 195 slices shown, 15 images]
[im 15/195  mediastinal]
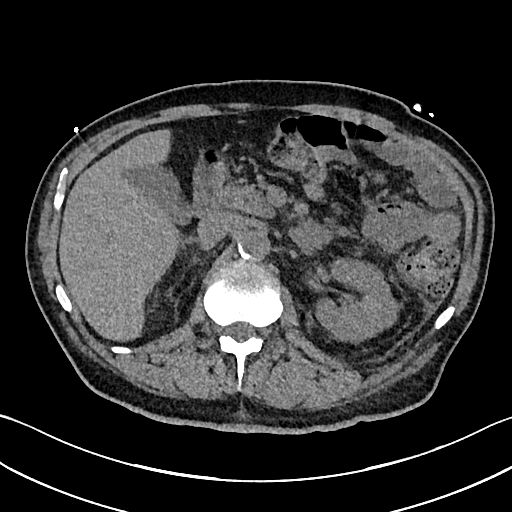
[im 15/195  lung]
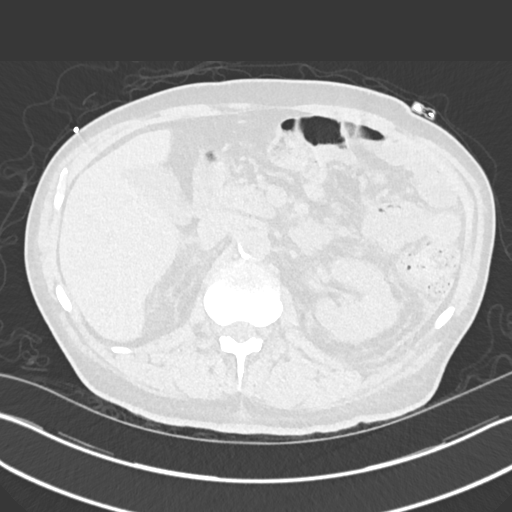
[im 30/195  lung]
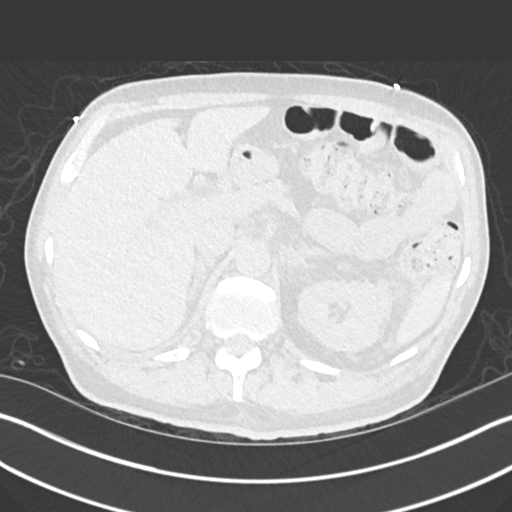
[im 45/195  lung]
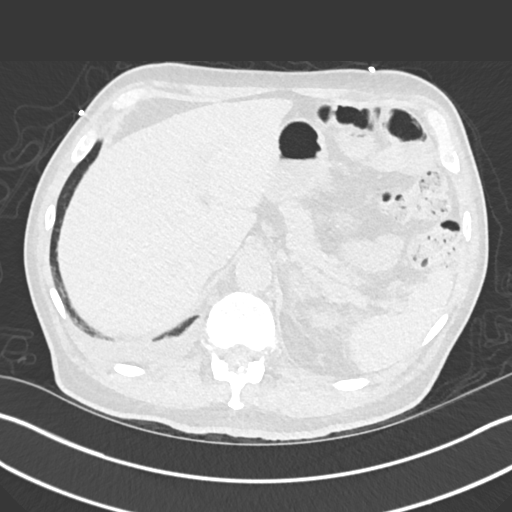
[im 60/195  lung]
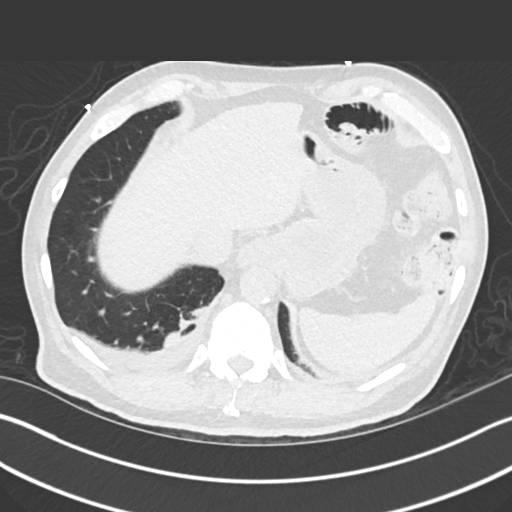
[im 75/195  mediastinal]
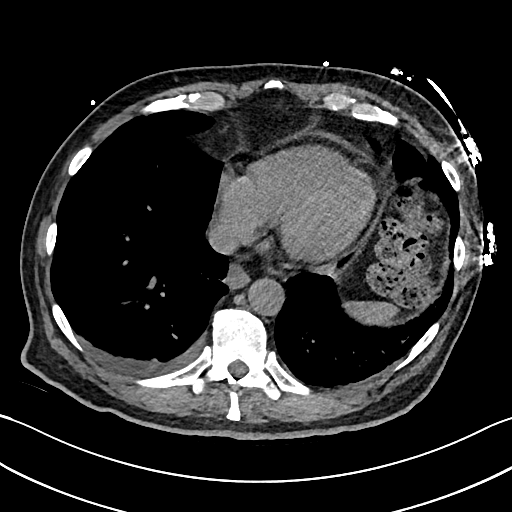
[im 75/195  lung]
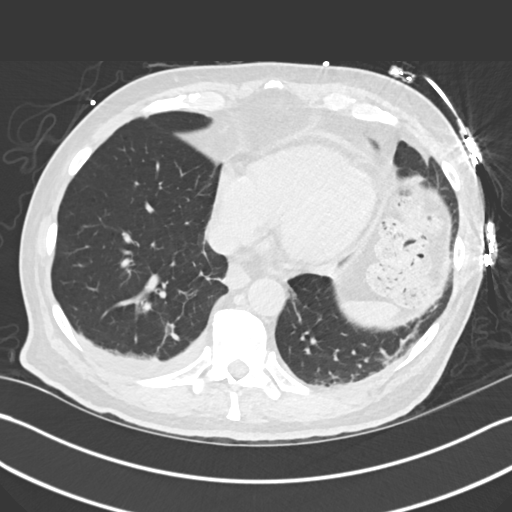
[im 90/195  lung]
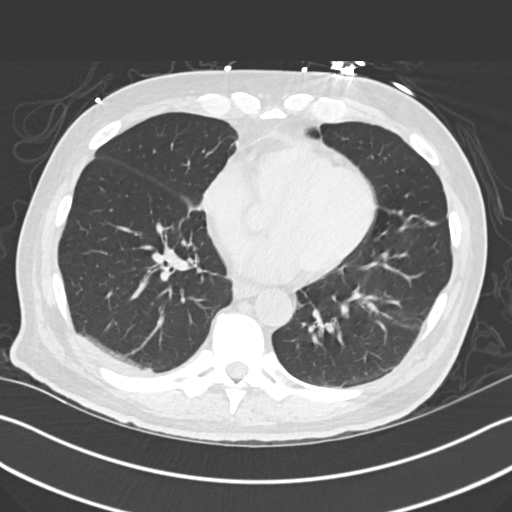
[im 105/195  lung]
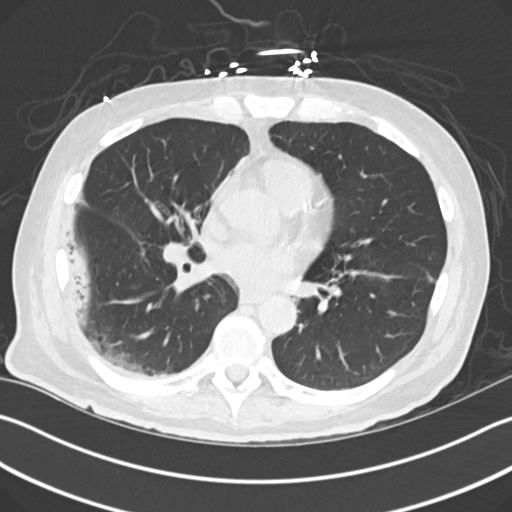
[im 120/195  lung]
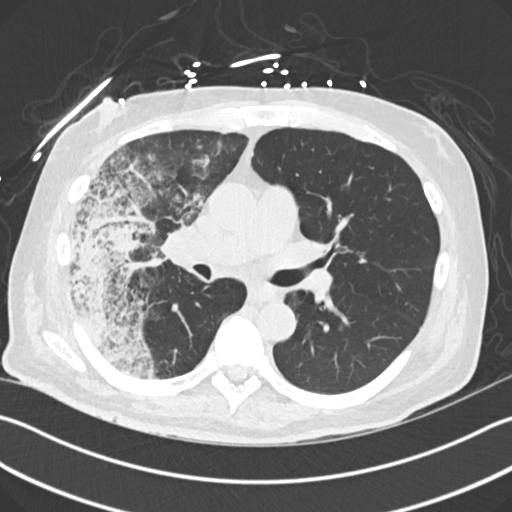
[im 135/195  mediastinal]
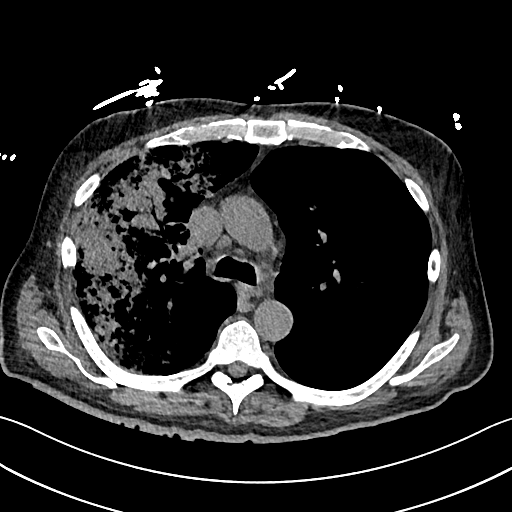
[im 135/195  lung]
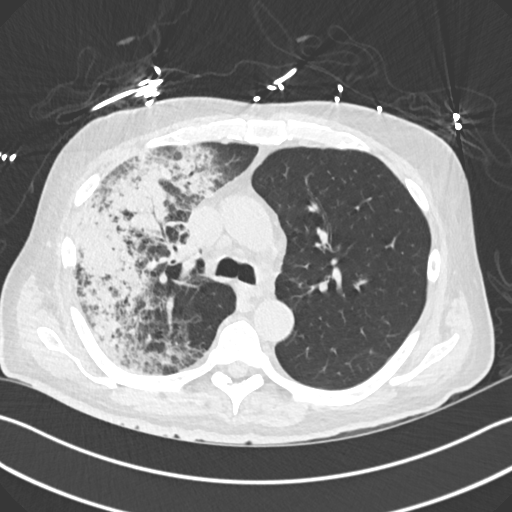
[im 150/195  lung]
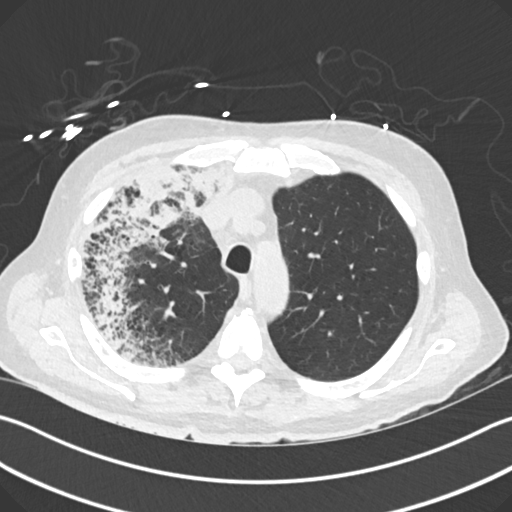
[im 165/195  lung]
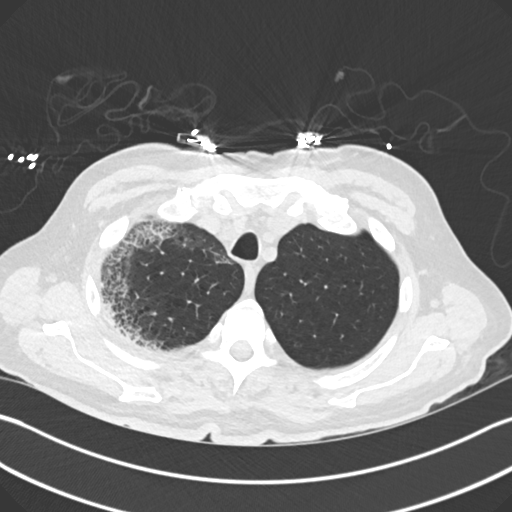
[im 180/195  lung]
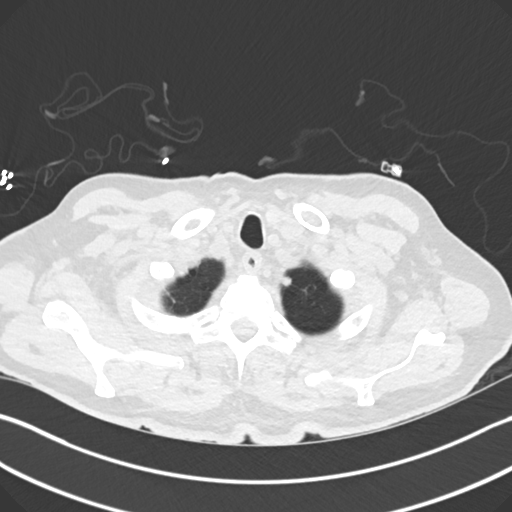

[Series 6: cor · coronal · 0.81mm/px · 3 of 137 slices shown]
[im 28/137  lung]
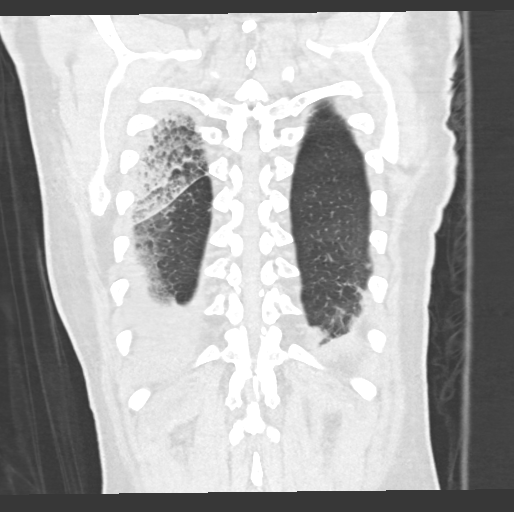
[im 55/137  lung]
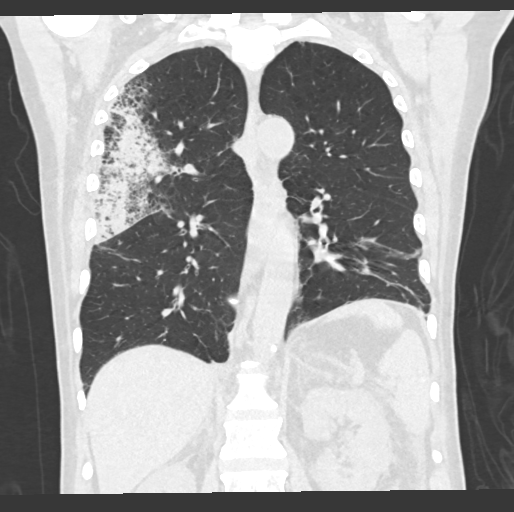
[im 82/137  lung]
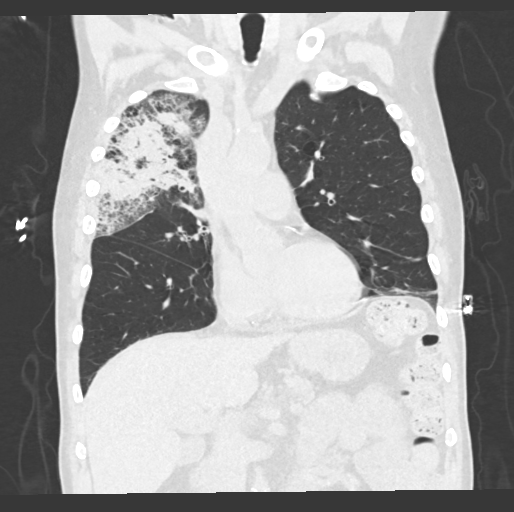

[15 of 36 positions shown; findings below may reference images not displayed]

FINDINGS: Cardiovascular: Limited without IV contrast. Aorta atherosclerotic.
No aneurysm. No acute mediastinal hemorrhage or hematoma. Normal
heart size. Native coronary atherosclerosis present. No pericardial
effusion.

Mediastinum/Nodes: Thyroid unremarkable. Trachea and central airways
are patent. Esophagus nondilated. No bulky adenopathy. Mildly
prominent right paratracheal and hilar lymph nodes with short axis
measurements of 9 mm favored to be reactive related to the right
upper lobe pneumonia described below.

Lungs/Pleura: Extensive right upper lobe diffuse mixed interstitial
and consolidative opacities with central air bronchograms compatible
with right upper lobe pneumonia extending to the pleural margins and
fissures. Sparing of the right middle and lower lobe. Background
emphysema noted with hyperinflation. Lingula and bibasilar
atelectasis present.

Within the right lower lobe posterior medially, there is a chronic
low-density oval lesion with relatively smooth margins and
peripheral calcifications. Hounsfield measurements are low at 26.
Lesion is slightly larger compared to low-dose screening exam on
10/11/2017 and remains indeterminate but favored represent a benign
process (post infectious/inflammatory).

Small right pleural effusion.  No significant left effusion.

Upper Abdomen: No acute upper abdominal finding. Abdominal
atherosclerosis.

Musculoskeletal: degenerative changes of the spine. No acute osseous
finding or chest wall soft tissue abnormality.
IMPRESSION: Extensive consolidative right upper lobe pneumonia correlates with
the chest x-ray. Small associated right pleural effusion.

Suspect reactive mediastinal and right hilar adenopathy.

Slight enlargement of a chronic low-density oval lesion with
peripheral calcifications in the posteromedial right lower lobe
compared to 5015 favored to represent a nonspecific benign process.
Please refer to the comparison low-dose screening CT for
recommendations of continued annual surveillance.

Lingula and bibasilar atelectasis.

Aortic Atherosclerosis (3S4GT-OFC.C) and Emphysema (3S4GT-ELV.L).

## 2023-09-18 NOTE — Telephone Encounter (Signed)
Last seen on 07/09/23 Follow up scheduled on 01/16/24

## 2023-09-20 IMAGING — DX DG CHEST 1V PORT
1 series · 1 of 1 positions shown · non-contrast
Comparison: 01/19/2022

CLINICAL DATA: Follow-up pneumonia.

EXAM:
PORTABLE CHEST 1 VIEW

[chest ap]
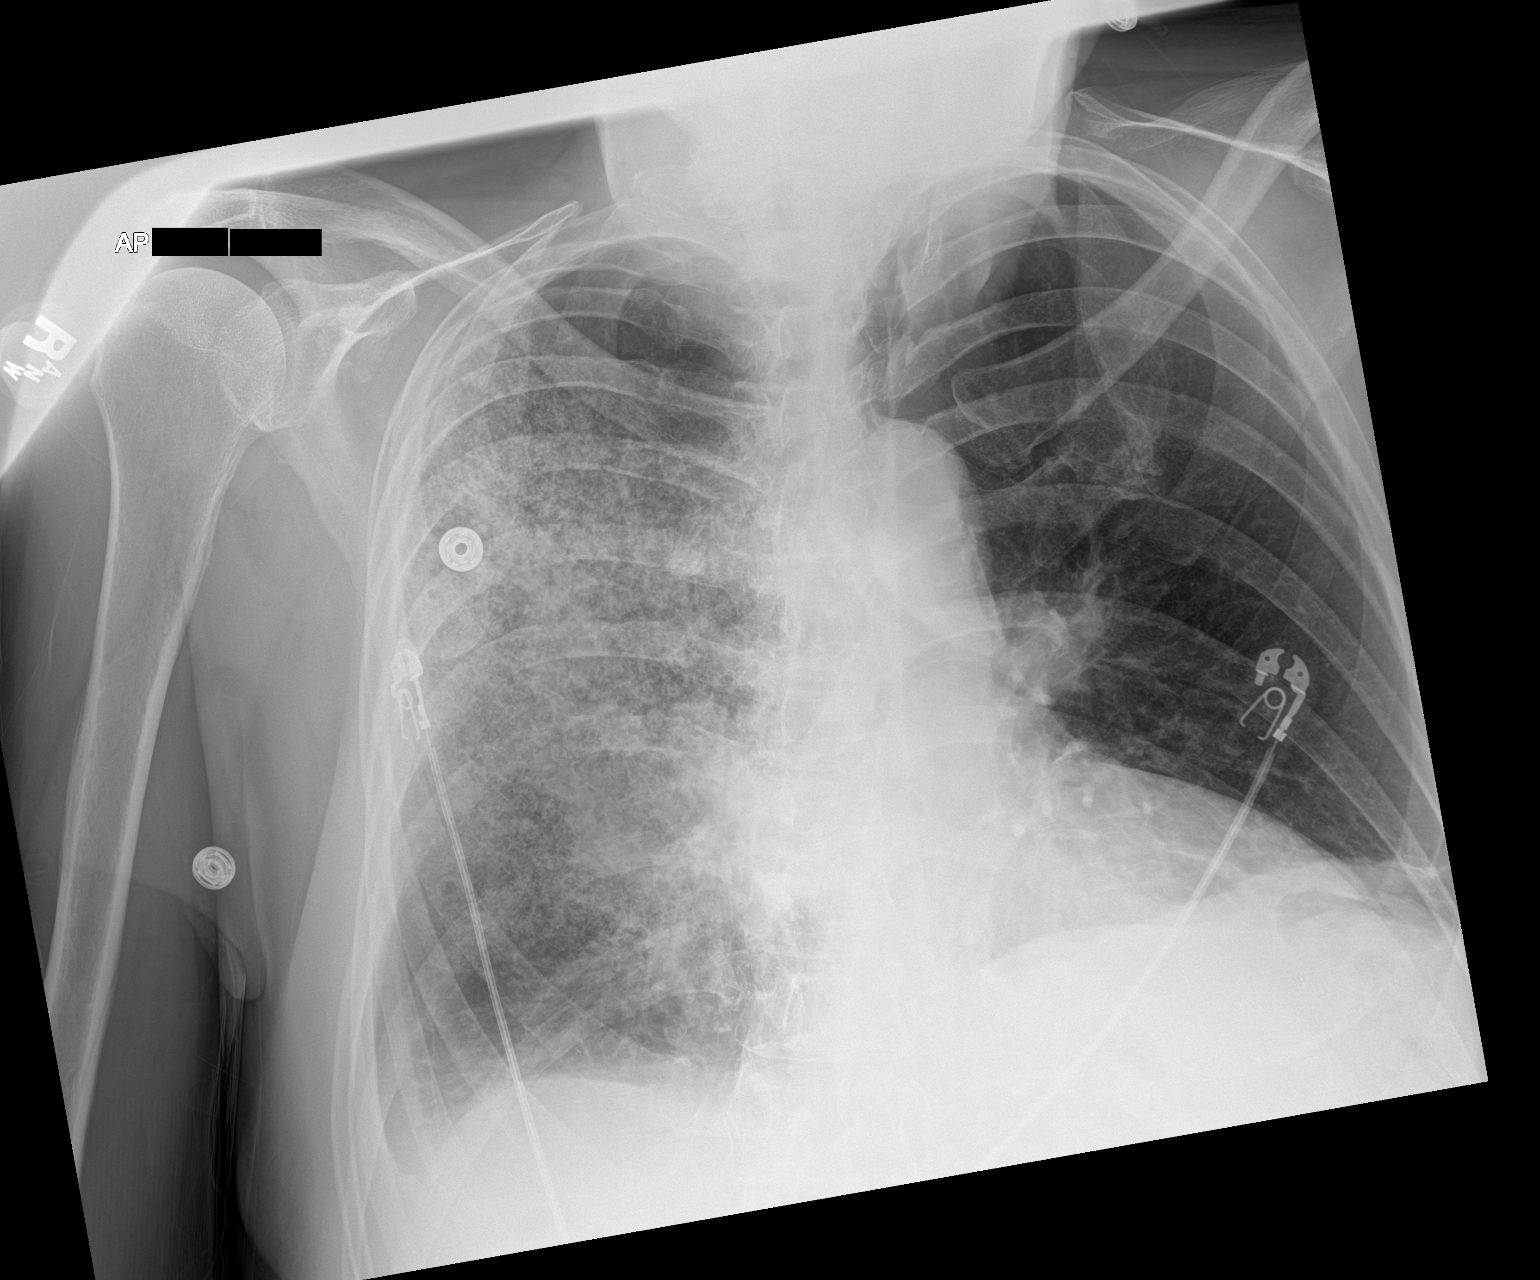

[1 of 1 positions shown; findings below may reference images not displayed]

FINDINGS: Heart size appears normal. Small right pleural effusion is new from
previous exam. Left lung clear. Persistent right upper lobe airspace
disease with new airspace opacities within the right midlung and
right lower lung.
IMPRESSION: 1. Persistent right upper lobe airspace disease with new airspace
opacities in the right midlung and right lower lung.
2. New small right pleural effusion.

## 2023-09-25 ENCOUNTER — Telehealth: Payer: Self-pay | Admitting: Neurology

## 2023-09-25 NOTE — Telephone Encounter (Signed)
Pt's sister confirming pt's upcoming appt details

## 2023-09-26 ENCOUNTER — Ambulatory Visit: Payer: Medicare Other | Admitting: Neurology

## 2023-10-30 ENCOUNTER — Telehealth: Payer: Self-pay | Admitting: *Deleted

## 2023-10-30 DIAGNOSIS — G35 Multiple sclerosis: Secondary | ICD-10-CM

## 2023-10-30 NOTE — Telephone Encounter (Signed)
I called sister Jamesetta So. She lives in [place] and asked that I call other sister, Laureen Ochs at  [phone]. She has medical power of attorney. She sees him at least twice a week and follows his care more closely. I called and LVM for Gabriel Hanson to call office.   We received notice that free drug program ended for Vumerity 10/09/23.

## 2023-10-31 MED ORDER — VUMERITY 231 MG PO CPDR: 1.0000 | DELAYED_RELEASE_CAPSULE | Freq: Two times a day (BID) | ORAL | 2 refills | Status: DC

## 2023-10-31 NOTE — Telephone Encounter (Signed)
Called [place] back. She saw father yesterday. He was running low on medicine. [phone]. May require PA. He has same insurance as last yr to her knowledge. Aware we will send proactive request to PA team to do PA.   She will call pharmacy tomorrow to get update. If copay high, she will inquire about assistance. If unable to help, she will call [employer] to discuss copay assistance through them.

## 2023-10-31 NOTE — Telephone Encounter (Signed)
Pt's sister is asking for a call back from [place], [place]

## 2023-10-31 NOTE — Addendum Note (Signed)
Addended by: Arther Abbott on: 10/31/2023 09:56 AM   Modules accepted: Orders

## 2023-10-31 NOTE — Telephone Encounter (Signed)
PA Team- can you please submit PA for Vumerity? Thank you!

## 2023-11-01 ENCOUNTER — Other Ambulatory Visit (HOSPITAL_COMMUNITY): Payer: Self-pay

## 2023-11-01 ENCOUNTER — Telehealth: Payer: Self-pay

## 2023-11-01 NOTE — Telephone Encounter (Signed)
Pharmacy Patient Advocate Encounter   Received notification from Physician's Office that prior authorization for VUMERITY 231MG  delayed-release capsules is required/requested.   Insurance verification completed.   The patient is insured through [facility] .   Per test claim: PA required; PA submitted to above mentioned insurance via CoverMyMeds Key/confirmation #/EOC [id number] Status is pending

## 2023-11-01 NOTE — Telephone Encounter (Signed)
PA request has been Submitted. New Encounter created for follow up. For additional info see Pharmacy Prior Auth telephone encounter from 11/01/2023.

## 2023-11-05 NOTE — Telephone Encounter (Signed)
Noted

## 2023-11-12 ENCOUNTER — Other Ambulatory Visit: Payer: Self-pay

## 2023-11-12 MED ORDER — METHYLPHENIDATE HCL 20 MG PO TABS: 20.0000 mg | ORAL_TABLET | ORAL | 0 refills | Status: DC

## 2023-11-12 NOTE — Telephone Encounter (Signed)
Patient called in to see if a refill of his  methylphenidate (RITALIN) 20 MG tablet can [id number] [place], [place] - [address]

## 2023-11-12 NOTE — Telephone Encounter (Signed)
Pt last Seen 07/09/2023 Upcoming Appointment 01/16/2024  Ritalin Last Filled 10/02/2023 Escript 11/12/2023

## 2023-12-07 ENCOUNTER — Encounter: Payer: Self-pay | Admitting: Neurology

## 2023-12-14 ENCOUNTER — Telehealth: Payer: Self-pay | Admitting: Neurology

## 2023-12-14 ENCOUNTER — Other Ambulatory Visit: Payer: Self-pay | Admitting: Neurology

## 2023-12-14 MED ORDER — METHYLPHENIDATE HCL 20 MG PO TABS: 20.0000 mg | ORAL_TABLET | ORAL | 0 refills | Status: DC

## 2023-12-14 NOTE — Telephone Encounter (Signed)
  Dispensed Days Supply Quantity Provider Pharmacy   METHYLPHENIDATE 20 MG TABLET 11/12/2023 30 30 each Sater, Pearletha Furl, MD [phone] - [facility]...  METHYLPHENIDATE 20 MG TABLET 10/02/2023 30 30 each Sater, Pearletha Furl, MD [facility] [phone] - G...  METHYLPHENIDATE 20 MG TABLET 08/28/2023 30 30 each Sater, Pearletha Furl, MD [facility] [phone] - G...  METHYLPHENIDATE 20 MG TABLET 07/18/2023 30 30 each Sater, Pearletha Furl, MD [facility] [phone] - G...  METHYLPHENIDATE 20 MG TABLET 06/14/2023 30 30 each Sater, Pearletha Furl, MD [facility] [phone] - G...  METHYLPHENIDATE 20 MG TABLET 03/26/2023 30 30 each Sater, Pearletha Furl, MD [facility] [phone] - G...  METHYLPHENIDATE 20 MG TABLET 02/05/2023 30 30 each Sater, Pearletha Furl, MD [facility] [phone] - G...  METHYLPHENIDATE 20 MG TABLET 12/27/2022 30 30 each Sater, Pearletha Furl, MD [facility] [phone] - G...  Next visit 01/16/24 Last visit 07/09/23

## 2023-12-14 NOTE — Telephone Encounter (Signed)
 Pt is needing a refill on his methylphenidate (RITALIN) 20 MG tablet and is [facility] [address]

## 2023-12-18 MED ORDER — METHYLPHENIDATE HCL 20 MG PO TABS: 20.0000 mg | ORAL_TABLET | ORAL | 0 refills | Status: DC

## 2023-12-18 NOTE — Telephone Encounter (Signed)
 Last seen 07/09/23, 01/16/24 is next appt  Dispenses   Dispensed Days Supply Quantity Provider Pharmacy  METHYLPHENIDATE 20 MG TABLET 11/12/2023 30 30 each Sater, Pearletha Furl, MD [facility] [phone] - G...  METHYLPHENIDATE 20 MG TABLET 10/02/2023 30 30 each Sater, Pearletha Furl, MD [facility] [phone] - G...  METHYLPHENIDATE 20 MG TABLET 08/28/2023 30 30 each Sater, Pearletha Furl, MD [facility] [phone] - G...  METHYLPHENIDATE 20 MG TABLET 07/18/2023 30 30 each Sater, Pearletha Furl, MD [facility] [phone] - G...  METHYLPHENIDATE 20 MG TABLET 06/14/2023 30 30 each Sater, Pearletha Furl, MD [facility] [phone] - G...  METHYLPHENIDATE 20 MG TABLET 03/26/2023 30 30 each Sater, Pearletha Furl, MD [facility] [phone] - G...  METHYLPHENIDATE 20 MG TABLET 02/05/2023 30 30 each Sater, Pearletha Furl, MD [facility] [phone] - G...  METHYLPHENIDATE 20 MG TABLET 12/27/2022 30 30 each Sater, Pearletha Furl, MD [facility] [phone] - G.Marland KitchenMarland Kitchen

## 2023-12-21 ENCOUNTER — Encounter: Payer: Self-pay | Admitting: Neurology

## 2023-12-24 NOTE — Telephone Encounter (Signed)
 Received response below from Crystal/[employer]:

## 2023-12-25 NOTE — Telephone Encounter (Signed)
 Per Crystal: "I just spoke to Alix . She will call with the patient tomorrow. I'll keep you posted! Crystal" 12/24/23 5:55pm.

## 2023-12-31 NOTE — Telephone Encounter (Signed)
 Update from [facility] today:  "Candise Bowens was able to secure [employer] for the patient to cover his out of pocket costs & [employer] has already scheduled the shipment. Thank you for reaching out on this patient - we only had his number and he was very difficult to reach! "

## 2024-01-09 ENCOUNTER — Ambulatory Visit
Admission: RE | Admit: 2024-01-09 | Discharge: 2024-01-09 | Disposition: A | Discharge status: Home / Self Care | Source: Ambulatory Visit | Attending: Neurology | Admitting: Neurology

## 2024-01-09 DIAGNOSIS — G35 Multiple sclerosis: Secondary | ICD-10-CM | POA: Diagnosis not present

## 2024-01-09 DIAGNOSIS — R269 Unspecified abnormalities of gait and mobility: Secondary | ICD-10-CM

## 2024-01-09 DIAGNOSIS — F09 Unspecified mental disorder due to known physiological condition: Secondary | ICD-10-CM

## 2024-01-09 MED ORDER — GADOPICLENOL 0.5 MMOL/ML IV SOLN
8.0000 mL | Freq: Once | INTRAVENOUS | Status: AC | PRN
  Administered 2024-01-09: 8 mL via INTRAVENOUS

## 2024-01-11 ENCOUNTER — Encounter: Payer: Self-pay | Admitting: Neurology

## 2024-01-11 ENCOUNTER — Ambulatory Visit
Admission: RE | Admit: 2024-01-11 | Discharge: 2024-01-11 | Disposition: A | Discharge status: Home / Self Care | Source: Ambulatory Visit | Attending: Neurology | Admitting: Neurology

## 2024-01-11 DIAGNOSIS — M5416 Radiculopathy, lumbar region: Secondary | ICD-10-CM

## 2024-01-11 DIAGNOSIS — R208 Other disturbances of skin sensation: Secondary | ICD-10-CM

## 2024-01-11 DIAGNOSIS — Z7960 Long term (current) use of unspecified immunomodulators and immunosuppressants: Secondary | ICD-10-CM

## 2024-01-11 DIAGNOSIS — G35 Multiple sclerosis: Secondary | ICD-10-CM | POA: Diagnosis not present

## 2024-01-11 DIAGNOSIS — R269 Unspecified abnormalities of gait and mobility: Secondary | ICD-10-CM | POA: Diagnosis not present

## 2024-01-11 MED ORDER — GADOPICLENOL 0.5 MMOL/ML IV SOLN
9.0000 mL | Freq: Once | INTRAVENOUS | Status: AC | PRN
  Administered 2024-01-11: 9 mL via INTRAVENOUS

## 2024-01-12 ENCOUNTER — Encounter: Payer: Self-pay | Admitting: Neurology

## 2024-01-14 ENCOUNTER — Telehealth: Payer: Self-pay | Admitting: Neurology

## 2024-01-14 ENCOUNTER — Other Ambulatory Visit: Payer: Self-pay | Admitting: *Deleted

## 2024-01-14 DIAGNOSIS — R269 Unspecified abnormalities of gait and mobility: Secondary | ICD-10-CM

## 2024-01-14 DIAGNOSIS — M4802 Spinal stenosis, cervical region: Secondary | ICD-10-CM

## 2024-01-14 NOTE — Telephone Encounter (Signed)
 Referral for neurosurgery fax to [facility]. Phone: [phone], Fax: [phone]

## 2024-01-16 ENCOUNTER — Encounter: Payer: Self-pay | Admitting: Neurology

## 2024-01-16 ENCOUNTER — Ambulatory Visit (INDEPENDENT_AMBULATORY_CARE_PROVIDER_SITE_OTHER): Payer: Medicare Other | Admitting: Neurology

## 2024-01-16 VITALS — BP 131/88 | HR 92 | Ht 73.0 in | Wt 185.5 lb

## 2024-01-16 DIAGNOSIS — M4802 Spinal stenosis, cervical region: Secondary | ICD-10-CM

## 2024-01-16 DIAGNOSIS — G8929 Other chronic pain: Secondary | ICD-10-CM | POA: Diagnosis not present

## 2024-01-16 DIAGNOSIS — Z79899 Other long term (current) drug therapy: Secondary | ICD-10-CM | POA: Diagnosis not present

## 2024-01-16 DIAGNOSIS — G35 Multiple sclerosis: Secondary | ICD-10-CM | POA: Diagnosis not present

## 2024-01-16 DIAGNOSIS — M5416 Radiculopathy, lumbar region: Secondary | ICD-10-CM

## 2024-01-16 DIAGNOSIS — R269 Unspecified abnormalities of gait and mobility: Secondary | ICD-10-CM

## 2024-01-16 DIAGNOSIS — F09 Unspecified mental disorder due to known physiological condition: Secondary | ICD-10-CM | POA: Diagnosis not present

## 2024-01-16 DIAGNOSIS — M25561 Pain in right knee: Secondary | ICD-10-CM | POA: Diagnosis not present

## 2024-01-16 DIAGNOSIS — R41840 Attention and concentration deficit: Secondary | ICD-10-CM

## 2024-01-16 MED ORDER — METHYLPHENIDATE HCL 20 MG PO TABS: 20.0000 mg | ORAL_TABLET | ORAL | 0 refills | Status: DC

## 2024-01-16 MED ORDER — SILODOSIN 8 MG PO CAPS: 8.0000 mg | ORAL_CAPSULE | Freq: Every day | ORAL | 3 refills | Status: AC

## 2024-01-16 NOTE — Telephone Encounter (Signed)
 Please schedule anything through his daughter Laureen Ochs   [phone] as patient does not drive

## 2024-01-16 NOTE — Progress Notes (Signed)
 [facility]  PATIENT: Gabriel Hanson DOB: 1951/05/15  REFERRING DOCTOR OR PCP:  None  SOURCE: patient  _________________________________   HISTORICAL  CHIEF COMPLAINT:  Chief Complaint  Patient presents with   Follow-up    Pt in room 11. [name] sister in room. Here for MS follow up. No falls, Gabriel Hanson states memory has declined, pt has to write things down more. On Vumerity    HISTORY OF PRESENT ILLNESS:  Gabriel Hanson is a 73 y.o. man with relapsing remitting MS.       Update 01/16/2024 He is on Vumerity.   He tolerates it well now.    Lymphocytes were 0.5. So we reduced dose to 231 mg po bid.   Lymphocyte improved to 0.7 last 4 checks since 2023 on the lower dose.  We will check again today.Gabriel Hanson  He denies any relapses but has noted some progressive changes in gait and his sister has noted progressive cognitive changes.     Gabapentin helped the leg pain.  After few months she stopped and the leg pain has continued to do fairly well.  His gait difficulties has worsened.  He began to use a walker around 2020 but now finds he cannot walk more than a few steps without it.  Balance is more of an issue than strength though sometimes the right leg will give out.  He uses the walker as cane was helping less.   He no longer can do grocery shopping   He has a torn right meniscus an has a lot of pain.  Gabriel Hanson.He has a shower chair.   He has numbness in toes, left > right.  He has urinary urgency and some incontinence, progressing over the past couple of years.   He usually has urgency before the leakage incontinence.  He wears depends when he goes out of his house and at night.  He has nocturia.    Silodosin helps more than tamsulosin.    He has difficulty with memory and other cognitive skills.  STM is worse than last year.Gabriel Hanson  He gets help from a friend and his sister.   His cognition is better with methylphenidate.  Although he does best on 20 mg a day, he sometimes takes just  half a pill because he feels the higher dose makes him feel "wired".  He scored 27/30 on the MoCA in 2023 but only 19/30 in 2024.  His sister notes his cognitive function fluctuates quite a bit.  He seems better today than at last visit.     He has more insomnia due to pain and bladder.    Mood improved on sertraline but he recently stopped to simplify med's.    .       03/21/2023    1:03 PM 01/16/2022   11:57 AM  Montreal Cognitive Assessment   Visuospatial/ Executive (0/5) 3 5  Naming (0/3) 3 3  Attention: Read list of digits (0/2) 2 2  Attention: Read list of letters (0/1) 1 1  Attention: Serial 7 subtraction starting at 100 (0/3) 3 3  Language: Repeat phrase (0/2) 1 1  Language : Fluency (0/1) 0 1  Abstraction (0/2) 1 2  Delayed Recall (0/5) 0 3  Orientation (0/6) 5 6  Total 19 27    MS History:  He was diagnosed with MS in 2001 when he presented with right visual loss.     He had an MRI worrisome for MS.   He was placed on IV  steroids and vision got 80% better.    He was placed on an interferon first and then Copaxone because he had tolerability issues.     For two years, he was without insurance and stopped all DMT.   I started seeing him in 2015 and we started Tecfidera.   He tolerates it well but sometimes forgets the second pill because all his medications are qAM.  MRI brain 2011 shows white matter foci in the periventricular and juxtacortical white matter consistent with the clinical diagnosis of MS. The right optic nerve was reduced in size. There w was one focus in the right pons and one in the left cerebellar hemisphere, as well. None of the foci were enhancing during that MRI.   MRI 04/01/2019 brain showed supratentorial and infratentorial lesions unchanged. Compared to 02/22/2016    REVIEW OF SYSTEMS: Constitutional: No fevers, chills, sweats, or change in appetite.  He notes some fatigue. Occasional insomnia. Eyes: No visual changes, double vision, eye pain Ear, nose and  throat: No hearing loss, ear pain, nasal congestion, sore throat Cardiovascular: No chest pain, palpitations Respiratory:  No shortness of breath at rest or with exertion.   No wheezes GastrointestinaI: No nausea, vomiting, diarrhea, abdominal pain, fecal incontinence.  Severe constipation Genitourinary:  as above.  Musculoskeletal:  No neck pain, back pain Integumentary: No rash, pruritus, skin lesions Neurological: as above Psychiatric: Mild depression. Cognitive decline. Endocrine: No palpitations, diaphoresis, change in appetite, change in weigh or increased thirst Hematologic/Lymphatic:  No anemia, purpura, petechiae. Allergic/Immunologic: No itchy/runny eyes, nasal congestion, recent allergic reactions, rashes  ALLERGIES: No Known Allergies  HOME MEDICATIONS:  Current Outpatient Medications:    [facility] SENNA 8.6 MG tablet, Take 2 tablets by mouth every evening., Disp: , Rfl:    Diroximel Fumarate (VUMERITY) 231 MG CPDR, Take 1 capsule by mouth 2 (two) times daily., Disp: 180 capsule, Rfl: 2   naproxen sodium (ALEVE) 220 MG tablet, Take 660 mg by mouth daily., Disp: , Rfl:    gabapentin (NEURONTIN) 300 MG capsule, One po qAm and two po qHS (Patient not taking: Reported on 01/16/2024), Disp: 90 capsule, Rfl: 11   methylphenidate (RITALIN) 20 MG tablet, Take 1 tablet (20 mg total) by mouth every morning., Disp: 30 tablet, Rfl: 0   sertraline (ZOLOFT) 50 MG tablet, TAKE 1 TABLET BY MOUTH EVERY DAY (Patient not taking: Reported on 01/16/2024), Disp: 90 tablet, Rfl: 4   silodosin (RAPAFLO) 8 MG CAPS capsule, Take 1 capsule (8 mg total) by mouth daily with breakfast., Disp: 90 capsule, Rfl: 3  PAST MEDICAL HISTORY: Past Medical History:  Diagnosis Date   Depressive disorder, not elsewhere classified    Disorder of eye, unspecified    Memory loss    Multiple sclerosis (HCC)    Other persistent mental disorders due to conditions classified elsewhere    Other psoriasis    Psychosexual  dysfunction, unspecified     PAST SURGICAL HISTORY: Past Surgical History:  Procedure Laterality Date   KNEE ARTHROSCOPY     right    TONSILLECTOMY      FAMILY HISTORY: Family History  Problem Relation Age of Onset   Heart attack Mother    COPD Father    Hypotension Sister    Heart disease Other     SOCIAL HISTORY:  Social History   Socioeconomic History   Marital status: Single    Spouse name: Not on file   Number of children: 0   Years of education: Not on file  Highest education level: Bachelor's degree (e.g., BA, AB, BS)  Occupational History   Occupation: Energy manager: [employer]    Comment: Fulltime  Tobacco Use   Smoking status: Heavy Smoker    Current packs/day: 1.00    Average packs/day: 1 pack/day for 44.0 years (44.0 ttl pk-yrs)    Types: Cigarettes   Smokeless tobacco: Never  Vaping Use   Vaping status: Every Day  Substance and Sexual Activity   Alcohol use: Yes    Alcohol/week: 0.0 standard drinks of alcohol    Comment: Occasionally/fim   Drug use: Yes    Types: Marijuana   Sexual activity: Not on file  Other Topics Concern   Not on file  Social History Narrative   Not on file   Social Drivers of Health   Financial Resource Strain: Low Risk  (09/28/2017)   Overall Financial Resource Strain (CARDIA)    Difficulty of Paying Living Expenses: Not very hard  Food Insecurity: Food Insecurity Present (09/28/2017)   Hunger Vital Sign    Worried About Running Out of Food in the Last Year: Often true    Ran Out of Food in the Last Year: Often true  Transportation Needs: No Transportation Needs (09/28/2017)   PRAPARE - Administrator, Civil Service (Medical): No    Lack of Transportation (Non-Medical): No  Physical Activity: Inactive (09/28/2017)   Exercise Vital Sign    Days of Exercise per Week: 0 days    Minutes of Exercise per Session: 0 min  Stress: Stress Concern Present (09/28/2017)   [employer] of  Occupational Health - Occupational Stress Questionnaire    Feeling of Stress : To some extent  Social Connections: Socially Isolated (09/28/2017)   Social Connection and Isolation Panel [NHANES]    Frequency of Communication with Friends and Family: Never    Frequency of Social Gatherings with Friends and Family: Never    Attends Religious Services: Never    Database administrator or Organizations: No    Attends Banker Meetings: Never    Marital Status: Never married  Intimate Partner Violence: Not At Risk (09/28/2017)   Humiliation, Afraid, Rape, and Kick questionnaire    Fear of Current or Ex-Partner: No    Emotionally Abused: No    Physically Abused: No    Sexually Abused: No     PHYSICAL EXAM  Vitals:   01/16/24 1520  BP: 131/88  Pulse: 92  Weight: 185 lb 8 oz (84.1 kg)  Height: 6\' 1"  (1.854 m)     Body mass index is 24.47 kg/m.   General: The patient is well-developed and well-nourished and in no acute distress  Neurologic Exam  Mental status: The patient is alert and oriented x 3 at the time of the examination.  Focus and attention are reduced.  Short-term memory is reduced.  He did not recall part of the conversation 5 minutes later.  See MoCA score from earlier this year above speech is normal.  Cranial nerves: Extraocular movements show a left INO but no diplopia. He has a 1+ APD on the right.   Color vision is desaturated OD.  Facial strength and sensation is normal. Trapezius strength is normal. No obvious hearing deficits are noted.  Motor:  Muscle bulk is normal.   Tone is slightly increased n legs.  . Strength is  5 / 5 in all 4 extremities   However, reduced RAM in left arm and foot  Sensory: Sensation is normal in his arms but reduced in the feet, more so on the left than the right.  Vibration sensation is 10% at the left ankle and 50% at the right ankle, absent at the left toes and 10% of the right toes.  He has some length dependent  reduced pinprick sensation in the left foot, worse in the L5 distribution compared to the S1 distribution.  Coordination: Cerebellar testing shows good bilateral finger-nose-finger slightly but reduced bilateral heel-to-shin  Gait and station: Station is normal.   He can take steps within the room without the walker but gait is very wide and also mildly spastic and shows slight right foot drop.  He can walk faster with the walker that has a cane.    He cannot do a tandem gait.  Romberg was positive.  Reflexes: Deep tendon reflexes are symmetric and mildly increased in the Legs with spread at the knees.  No ankle clonus.    DIAGNOSTIC DATA (LABS, IMAGING, TESTING) - I reviewed patient records, labs, notes, testing and imaging myself where available.  Lab Results  Component Value Date   WBC 5.0 07/09/2023   HGB 15.2 07/09/2023   HCT 45.1 07/09/2023   MCV 92 07/09/2023   PLT 273 07/09/2023      Component Value Date/Time   NA 136 03/21/2023 1441   K 3.9 03/21/2023 1441   CL 98 03/21/2023 1441   CO2 24 03/21/2023 1441   GLUCOSE 95 03/21/2023 1441   GLUCOSE 115 (H) 01/23/2022 0305   BUN 11 03/21/2023 1441   CREATININE 0.90 03/21/2023 1441   CALCIUM 9.6 03/21/2023 1441   PROT 6.6 03/21/2023 1441   ALBUMIN 4.4 03/21/2023 1441   AST 16 03/21/2023 1441   ALT 10 03/21/2023 1441   ALKPHOS 91 03/21/2023 1441   BILITOT 0.7 03/21/2023 1441   GFRNONAA >60 01/23/2022 0305   GFRAA 103 09/28/2017 1000       ASSESSMENT AND PLAN  Multiple sclerosis (HCC) - Plan: CBC with Differential/Platelet  Cervical spinal stenosis  Gait difficulty  Cognitive dysfunction  Attention deficit  Lumbar radiculopathy  Chronic pain of right knee - Plan: AMB referral to orthopedics, CANCELED: AMB referral to orthopedics  High risk medication use - Plan: CBC with Differential/Platelet   1.   Continue Vumerity 231 mg po bid (half dose).    Check CBC/D, CMP today.  Repeat MRI of the brain to  determine if there is any breakthrough activity and change to an anti-CD20 agent if this is occurring.   2.   We discussed MRI cervical findings - slight progression at C3-C4 now with mild hyperintense change c/w mild myelopathy.  Due to MS hard to know how much of the gait issue is due to MS and how much to myelopathy   because gait has worsened over the past couple of years, and the cervical MRI shows slightly more stenosis at C3-C4, we will request a [facility] opinion regarding surgery. 3.    Ritalin 20 mg daily.    Continue silodosin.  We have discussed referral to urology if things worsen 4.    rtc 6 months.  This visit is part of a comprehensive longitudinal care medical relationship regarding the patients primary diagnosis of MS and related concerns.    Euclid Cassetta A. Epimenio Foot, MD, PhD 01/16/2024, 4:10 PM Certified in Neurology, Clinical Neurophysiology, Sleep Medicine, Pain Medicine and Neuroimaging  [facility] [address], [address] [place], [place] [address] [phone]

## 2024-01-17 ENCOUNTER — Encounter: Payer: Self-pay | Admitting: Neurology

## 2024-01-17 ENCOUNTER — Telehealth: Payer: Self-pay | Admitting: Neurology

## 2024-01-17 LAB — CBC WITH DIFFERENTIAL/PLATELET
Basophils Absolute: 0.1 10*3/uL (ref 0.0–0.2)
Basos: 1 %
EOS (ABSOLUTE): 0.1 10*3/uL (ref 0.0–0.4)
Eos: 2 %
Hematocrit: 44.4 % (ref 37.5–51.0)
Hemoglobin: 15.3 g/dL (ref 13.0–17.7)
Immature Grans (Abs): 0 10*3/uL (ref 0.0–0.1)
Immature Granulocytes: 0 %
Lymphocytes Absolute: 1.1 10*3/uL (ref 0.7–3.1)
Lymphs: 20 %
MCH: 31.2 pg (ref 26.6–33.0)
MCHC: 34.5 g/dL (ref 31.5–35.7)
MCV: 91 fL (ref 79–97)
Monocytes Absolute: 0.5 10*3/uL (ref 0.1–0.9)
Monocytes: 9 %
Neutrophils Absolute: 3.7 10*3/uL (ref 1.4–7.0)
Neutrophils: 68 %
Platelets: 236 10*3/uL (ref 150–450)
RBC: 4.9 x10E6/uL (ref 4.14–5.80)
RDW: 13.2 % (ref 11.6–15.4)
WBC: 5.5 10*3/uL (ref 3.4–10.8)

## 2024-01-17 NOTE — Telephone Encounter (Signed)
Referral for orthopedic fax to [facility]. Phone: [phone], Fax: [phone].

## 2024-02-06 ENCOUNTER — Encounter: Payer: Self-pay | Admitting: Neurology

## 2024-02-14 DIAGNOSIS — M4802 Spinal stenosis, cervical region: Secondary | ICD-10-CM | POA: Diagnosis not present

## 2024-02-14 DIAGNOSIS — G992 Myelopathy in diseases classified elsewhere: Secondary | ICD-10-CM | POA: Diagnosis not present

## 2024-02-14 DIAGNOSIS — G35 Multiple sclerosis: Secondary | ICD-10-CM | POA: Diagnosis not present

## 2024-02-19 DIAGNOSIS — G8929 Other chronic pain: Secondary | ICD-10-CM | POA: Diagnosis not present

## 2024-02-19 DIAGNOSIS — M1711 Unilateral primary osteoarthritis, right knee: Secondary | ICD-10-CM | POA: Diagnosis not present

## 2024-02-19 DIAGNOSIS — M25561 Pain in right knee: Secondary | ICD-10-CM | POA: Diagnosis not present

## 2024-03-17 ENCOUNTER — Encounter: Payer: Self-pay | Admitting: Neurology

## 2024-03-17 MED ORDER — METHYLPHENIDATE HCL 20 MG PO TABS: 20.0000 mg | ORAL_TABLET | ORAL | 0 refills | Status: DC

## 2024-03-17 NOTE — Telephone Encounter (Signed)
 Pt last seen on 01/16/24 No 6 month follow up scheduled    Dispensed Days Supply Quantity Provider Pharmacy  METHYLPHENIDATE  20 MG TABLET 02/12/2024 30 30 each Sater, Sherida Dimmer, MD [facility] [phone] - [facility]...     Rx pending to be signed

## 2024-04-08 NOTE — Telephone Encounter (Signed)
 Printed for MD to see if he is agreeable to complete form, waiting on response.

## 2024-04-14 NOTE — Telephone Encounter (Signed)
 Gave completed signed form below to MR

## 2024-04-29 ENCOUNTER — Telehealth: Payer: Self-pay | Admitting: Neurology

## 2024-04-29 NOTE — Telephone Encounter (Signed)
 Pt last seen 01/16/24 and he has no follow up scheduled currently.   Phone room: please call pt back and schedule 6 month f/u. You can offer 07/21/24 at 2:30p with Dr. Vear.  Once done, please route back and we will send refill to Dr. Vear to send in

## 2024-04-29 NOTE — Telephone Encounter (Signed)
 Pt is needing a refill request for his methylphenidate  (RITALIN ) 20 MG tablet [facility] [address].

## 2024-04-30 NOTE — Telephone Encounter (Signed)
 Please call pt back and let him know he has to be seen every 6 months in order to get refills on his medication. This is a controlled medication and requires close follow up

## 2024-05-05 NOTE — Telephone Encounter (Addendum)
 Pt states this medication is messing him up big time. He is taking half of it and he says it is a high that he does not like. He can not calm down during the day and this is starting at 5am. He feel's that this batch of medication is made differently because it was never this bad. I reached out to the RN and she had me inform the pt to call the Pharmacy to see if they changed manufacturers. Pt stated that he will call them and keep us  updated.

## 2024-05-05 NOTE — Telephone Encounter (Signed)
Dr. Felecia Shelling- any other recommendations?

## 2024-05-05 NOTE — Telephone Encounter (Signed)
 Called and spoke with pt. Relayed Dr. Duncan recommendation. He will try to do 1/4 tablet and see how he tolerates this. He will provide update.

## 2024-05-08 ENCOUNTER — Other Ambulatory Visit: Payer: Self-pay | Admitting: Neurology

## 2024-05-08 MED ORDER — AMPHETAMINE-DEXTROAMPHETAMINE 20 MG PO TABS: 20.0000 mg | ORAL_TABLET | Freq: Two times a day (BID) | ORAL | 0 refills | Status: DC

## 2024-05-08 NOTE — Telephone Encounter (Signed)
 Dr.Sater Rx printed I have it pending so it can be signed and sent to pharmacy.

## 2024-05-08 NOTE — Addendum Note (Signed)
 Addended by: DOUGLASS DELON CROME on: 05/08/2024 01:29 PM   Modules accepted: Orders

## 2024-05-08 NOTE — Telephone Encounter (Addendum)
 Dr.Sater please see the below message are you okay switching? He has been on Adderall 20 mg in past.

## 2024-05-09 ENCOUNTER — Encounter: Payer: Self-pay | Admitting: Neurology

## 2024-07-10 ENCOUNTER — Other Ambulatory Visit: Payer: Self-pay

## 2024-07-10 ENCOUNTER — Telehealth: Payer: Self-pay | Admitting: Neurology

## 2024-07-10 MED ORDER — AMPHETAMINE-DEXTROAMPHETAMINE 20 MG PO TABS: 20.0000 mg | ORAL_TABLET | Freq: Two times a day (BID) | ORAL | 0 refills | Status: DC

## 2024-07-10 NOTE — Telephone Encounter (Signed)
 Pt Last Seen 01/16/2024 No Upcoming Appointment   Adderall Last Filled 05/08/2024

## 2024-07-10 NOTE — Telephone Encounter (Signed)
 Patient request refill for amphetamine -dextroamphetamine  (ADDERALL) 20 MG tablet [facility] [phone]

## 2024-07-11 DIAGNOSIS — Z23 Encounter for immunization: Secondary | ICD-10-CM | POA: Diagnosis not present

## 2024-07-19 ENCOUNTER — Emergency Department (HOSPITAL_COMMUNITY)

## 2024-07-19 ENCOUNTER — Inpatient Hospital Stay (HOSPITAL_COMMUNITY)
Admission: EM | Admit: 2024-07-19 | Discharge: 2024-07-23 | DRG: 522 | Disposition: A | Discharge status: Skilled Nursing Facility | Attending: Internal Medicine | Admitting: Internal Medicine

## 2024-07-19 ENCOUNTER — Encounter (HOSPITAL_COMMUNITY): Payer: Self-pay | Admitting: Emergency Medicine

## 2024-07-19 ENCOUNTER — Other Ambulatory Visit: Payer: Self-pay

## 2024-07-19 DIAGNOSIS — S0990XA Unspecified injury of head, initial encounter: Secondary | ICD-10-CM | POA: Diagnosis not present

## 2024-07-19 DIAGNOSIS — W19XXXA Unspecified fall, initial encounter: Secondary | ICD-10-CM | POA: Diagnosis present

## 2024-07-19 DIAGNOSIS — M25462 Effusion, left knee: Secondary | ICD-10-CM | POA: Diagnosis not present

## 2024-07-19 DIAGNOSIS — M1612 Unilateral primary osteoarthritis, left hip: Secondary | ICD-10-CM | POA: Diagnosis present

## 2024-07-19 DIAGNOSIS — S72012A Unspecified intracapsular fracture of left femur, initial encounter for closed fracture: Principal | ICD-10-CM | POA: Diagnosis present

## 2024-07-19 DIAGNOSIS — F17210 Nicotine dependence, cigarettes, uncomplicated: Secondary | ICD-10-CM | POA: Diagnosis present

## 2024-07-19 DIAGNOSIS — S32010D Wedge compression fracture of first lumbar vertebra, subsequent encounter for fracture with routine healing: Secondary | ICD-10-CM | POA: Diagnosis not present

## 2024-07-19 DIAGNOSIS — R531 Weakness: Secondary | ICD-10-CM | POA: Diagnosis not present

## 2024-07-19 DIAGNOSIS — M4802 Spinal stenosis, cervical region: Secondary | ICD-10-CM | POA: Diagnosis not present

## 2024-07-19 DIAGNOSIS — Z8249 Family history of ischemic heart disease and other diseases of the circulatory system: Secondary | ICD-10-CM

## 2024-07-19 DIAGNOSIS — F17290 Nicotine dependence, other tobacco product, uncomplicated: Secondary | ICD-10-CM | POA: Diagnosis present

## 2024-07-19 DIAGNOSIS — M4854XA Collapsed vertebra, not elsewhere classified, thoracic region, initial encounter for fracture: Secondary | ICD-10-CM | POA: Diagnosis present

## 2024-07-19 DIAGNOSIS — Z96642 Presence of left artificial hip joint: Secondary | ICD-10-CM | POA: Diagnosis not present

## 2024-07-19 DIAGNOSIS — S199XXA Unspecified injury of neck, initial encounter: Secondary | ICD-10-CM | POA: Diagnosis not present

## 2024-07-19 DIAGNOSIS — R41841 Cognitive communication deficit: Secondary | ICD-10-CM | POA: Diagnosis not present

## 2024-07-19 DIAGNOSIS — W19XXXD Unspecified fall, subsequent encounter: Secondary | ICD-10-CM | POA: Diagnosis not present

## 2024-07-19 DIAGNOSIS — Z7401 Bed confinement status: Secondary | ICD-10-CM | POA: Diagnosis not present

## 2024-07-19 DIAGNOSIS — F32A Depression, unspecified: Secondary | ICD-10-CM | POA: Diagnosis present

## 2024-07-19 DIAGNOSIS — F909 Attention-deficit hyperactivity disorder, unspecified type: Secondary | ICD-10-CM | POA: Diagnosis present

## 2024-07-19 DIAGNOSIS — R2689 Other abnormalities of gait and mobility: Secondary | ICD-10-CM | POA: Diagnosis not present

## 2024-07-19 DIAGNOSIS — G35D Multiple sclerosis, unspecified: Secondary | ICD-10-CM | POA: Diagnosis present

## 2024-07-19 DIAGNOSIS — Z79899 Other long term (current) drug therapy: Secondary | ICD-10-CM | POA: Diagnosis not present

## 2024-07-19 DIAGNOSIS — S72002D Fracture of unspecified part of neck of left femur, subsequent encounter for closed fracture with routine healing: Secondary | ICD-10-CM | POA: Diagnosis not present

## 2024-07-19 DIAGNOSIS — Z471 Aftercare following joint replacement surgery: Secondary | ICD-10-CM | POA: Diagnosis not present

## 2024-07-19 DIAGNOSIS — Y92009 Unspecified place in unspecified non-institutional (private) residence as the place of occurrence of the external cause: Secondary | ICD-10-CM | POA: Diagnosis not present

## 2024-07-19 DIAGNOSIS — K5900 Constipation, unspecified: Secondary | ICD-10-CM | POA: Diagnosis not present

## 2024-07-19 DIAGNOSIS — Z043 Encounter for examination and observation following other accident: Secondary | ICD-10-CM | POA: Diagnosis not present

## 2024-07-19 DIAGNOSIS — N400 Enlarged prostate without lower urinary tract symptoms: Secondary | ICD-10-CM | POA: Diagnosis present

## 2024-07-19 DIAGNOSIS — Z66 Do not resuscitate: Secondary | ICD-10-CM | POA: Diagnosis not present

## 2024-07-19 DIAGNOSIS — S72009A Fracture of unspecified part of neck of unspecified femur, initial encounter for closed fracture: Secondary | ICD-10-CM | POA: Diagnosis present

## 2024-07-19 DIAGNOSIS — G3184 Mild cognitive impairment, so stated: Secondary | ICD-10-CM | POA: Diagnosis not present

## 2024-07-19 DIAGNOSIS — M85862 Other specified disorders of bone density and structure, left lower leg: Secondary | ICD-10-CM | POA: Diagnosis present

## 2024-07-19 DIAGNOSIS — R4189 Other symptoms and signs involving cognitive functions and awareness: Secondary | ICD-10-CM | POA: Diagnosis present

## 2024-07-19 DIAGNOSIS — M1712 Unilateral primary osteoarthritis, left knee: Secondary | ICD-10-CM | POA: Diagnosis not present

## 2024-07-19 DIAGNOSIS — K219 Gastro-esophageal reflux disease without esophagitis: Secondary | ICD-10-CM | POA: Diagnosis not present

## 2024-07-19 DIAGNOSIS — S72002A Fracture of unspecified part of neck of left femur, initial encounter for closed fracture: Secondary | ICD-10-CM | POA: Diagnosis not present

## 2024-07-19 DIAGNOSIS — M6281 Muscle weakness (generalized): Secondary | ICD-10-CM | POA: Diagnosis not present

## 2024-07-19 DIAGNOSIS — R638 Other symptoms and signs concerning food and fluid intake: Secondary | ICD-10-CM | POA: Diagnosis not present

## 2024-07-19 DIAGNOSIS — M50321 Other cervical disc degeneration at C4-C5 level: Secondary | ICD-10-CM | POA: Diagnosis not present

## 2024-07-19 DIAGNOSIS — M47812 Spondylosis without myelopathy or radiculopathy, cervical region: Secondary | ICD-10-CM | POA: Diagnosis not present

## 2024-07-19 DIAGNOSIS — M4856XA Collapsed vertebra, not elsewhere classified, lumbar region, initial encounter for fracture: Secondary | ICD-10-CM | POA: Diagnosis not present

## 2024-07-19 LAB — CBC WITH DIFFERENTIAL/PLATELET
Abs Immature Granulocytes: 0.03 K/uL (ref 0.00–0.07)
Basophils Absolute: 0 K/uL (ref 0.0–0.1)
Basophils Relative: 0 %
Eosinophils Absolute: 0 K/uL (ref 0.0–0.5)
Eosinophils Relative: 0 %
HCT: 44.8 % (ref 39.0–52.0)
Hemoglobin: 14.7 g/dL (ref 13.0–17.0)
Immature Granulocytes: 0 %
Lymphocytes Relative: 4 %
Lymphs Abs: 0.4 K/uL — ABNORMAL LOW (ref 0.7–4.0)
MCH: 30.2 pg (ref 26.0–34.0)
MCHC: 32.8 g/dL (ref 30.0–36.0)
MCV: 92 fL (ref 80.0–100.0)
Monocytes Absolute: 0.7 K/uL (ref 0.1–1.0)
Monocytes Relative: 8 %
Neutro Abs: 8 K/uL — ABNORMAL HIGH (ref 1.7–7.7)
Neutrophils Relative %: 88 %
Platelets: 222 K/uL (ref 150–400)
RBC: 4.87 MIL/uL (ref 4.22–5.81)
RDW: 13.1 % (ref 11.5–15.5)
WBC: 9.2 K/uL (ref 4.0–10.5)
nRBC: 0 % (ref 0.0–0.2)

## 2024-07-19 LAB — BASIC METABOLIC PANEL WITH GFR
Anion gap: 7 (ref 5–15)
BUN: 14 mg/dL (ref 8–23)
CO2: 29 mmol/L (ref 22–32)
Calcium: 9.6 mg/dL (ref 8.9–10.3)
Chloride: 100 mmol/L (ref 98–111)
Creatinine, Ser: 0.86 mg/dL (ref 0.61–1.24)
GFR, Estimated: >60 mL/min (ref >60)
Glucose, Bld: 115 mg/dL — ABNORMAL HIGH (ref 70–99)
Potassium: 5 mmol/L (ref 3.5–5.1)
Sodium: 136 mmol/L (ref 135–145)

## 2024-07-19 LAB — SURGICAL PCR SCREEN
MRSA, PCR: NEGATIVE
Staphylococcus aureus: NEGATIVE

## 2024-07-19 MED ORDER — ALBUTEROL SULFATE (2.5 MG/3ML) 0.083% IN NEBU: 2.5000 mg | INHALATION_SOLUTION | RESPIRATORY_TRACT | Status: DC | PRN

## 2024-07-19 MED ORDER — OXYCODONE HCL 5 MG PO TABS: 5.0000 mg | ORAL_TABLET | ORAL | Status: DC | PRN

## 2024-07-19 MED ORDER — DIROXIMEL FUMARATE 231 MG PO CPDR
1.0000 | DELAYED_RELEASE_CAPSULE | Freq: Two times a day (BID) | ORAL | Status: DC
  Administered 2024-07-21 – 2024-07-23 (×5): 1 via ORAL
  Filled 2024-07-19 (×5): qty 1

## 2024-07-19 MED ORDER — NAPROXEN SODIUM 220 MG PO TABS: 660.0000 mg | ORAL_TABLET | Freq: Every day | ORAL | Status: DC

## 2024-07-19 MED ORDER — ACETAMINOPHEN 650 MG RE SUPP: 650.0000 mg | Freq: Four times a day (QID) | RECTAL | Status: DC | PRN

## 2024-07-19 MED ORDER — ONDANSETRON HCL 4 MG/2ML IJ SOLN: 4.0000 mg | Freq: Four times a day (QID) | INTRAMUSCULAR | Status: DC | PRN

## 2024-07-19 MED ORDER — ONDANSETRON HCL 4 MG/2ML IJ SOLN
4.0000 mg | Freq: Once | INTRAMUSCULAR | Status: AC
  Administered 2024-07-19: 4 mg via INTRAVENOUS
  Filled 2024-07-19: qty 2

## 2024-07-19 MED ORDER — TRAZODONE HCL 50 MG PO TABS: 25.0000 mg | ORAL_TABLET | Freq: Every evening | ORAL | Status: DC | PRN

## 2024-07-19 MED ORDER — MORPHINE SULFATE (PF) 2 MG/ML IV SOLN: 2.0000 mg | INTRAVENOUS | Status: DC | PRN

## 2024-07-19 MED ORDER — MORPHINE SULFATE (PF) 4 MG/ML IV SOLN
4.0000 mg | Freq: Once | INTRAVENOUS | Status: AC
  Administered 2024-07-19: 4 mg via INTRAVENOUS
  Filled 2024-07-19: qty 1

## 2024-07-19 MED ORDER — ONDANSETRON HCL 4 MG PO TABS: 4.0000 mg | ORAL_TABLET | Freq: Four times a day (QID) | ORAL | Status: DC | PRN

## 2024-07-19 MED ORDER — ENOXAPARIN SODIUM 40 MG/0.4ML IJ SOSY
40.0000 mg | PREFILLED_SYRINGE | INTRAMUSCULAR | Status: AC
  Administered 2024-07-19: 40 mg via SUBCUTANEOUS
  Filled 2024-07-19: qty 0.4

## 2024-07-19 MED ORDER — TAMSULOSIN HCL 0.4 MG PO CAPS
0.4000 mg | ORAL_CAPSULE | Freq: Every day | ORAL | Status: DC
  Administered 2024-07-21 – 2024-07-23 (×3): 0.4 mg via ORAL
  Filled 2024-07-19 (×4): qty 1

## 2024-07-19 MED ORDER — AMPHETAMINE-DEXTROAMPHETAMINE 10 MG PO TABS
20.0000 mg | ORAL_TABLET | Freq: Every day | ORAL | Status: DC
Start: 2024-07-20 — End: 2024-07-20

## 2024-07-19 MED ORDER — ACETAMINOPHEN 325 MG PO TABS: 650.0000 mg | ORAL_TABLET | Freq: Four times a day (QID) | ORAL | Status: DC | PRN

## 2024-07-19 MED ORDER — NAPROXEN 250 MG PO TABS
500.0000 mg | ORAL_TABLET | Freq: Every day | ORAL | Status: DC
  Administered 2024-07-21 – 2024-07-23 (×3): 500 mg via ORAL
  Filled 2024-07-19 (×4): qty 2

## 2024-07-19 NOTE — Progress Notes (Signed)
 Consult received from EDP.  I have reviewed imaging.  Patient will need THA.  He is scheduled for this tomorrow morning 7:30. NPO after midnight.  Full consult in the morning.

## 2024-07-19 NOTE — ED Notes (Signed)
 Unsuccessful IV attempt x2.

## 2024-07-19 NOTE — ED Notes (Addendum)
 Pt transferred to room 5 due to positive hip fx per XR. Pending admission.

## 2024-07-19 NOTE — ED Provider Notes (Signed)
 [place] EMERGENCY DEPARTMENT AT [facility] Provider Note   CSN: [id number] Arrival date & time: 07/19/24  1050     Patient presents with: Gabriel Hanson is a 73 y.o. male presents today for a fall in which his left leg gave out while walking this morning and he fell to the ground.  Patient reports being unable to weight-bear on his left leg and left lower back pain.  Patient denies LOC, numbness, unilateral weakness, diplopia, headache, tinnitus, or any other complaints at this time.    Fall       Prior to Admission medications   Medication Sig Start Date End Date Taking? Authorizing Provider  amphetamine -dextroamphetamine  (ADDERALL) 20 MG tablet Take 1 tablet (20 mg total) by mouth 2 (two) times daily. 07/10/24   Sater, Charlie LABOR, MD  [employer] SENNA 8.6 MG tablet Take 2 tablets by mouth every evening. 02/15/22   [provider]  Diroximel Fumarate  (VUMERITY ) 231 MG CPDR Take 1 capsule by mouth 2 (two) times daily. 10/31/23   Sater, Charlie LABOR, MD  gabapentin  (NEURONTIN ) 300 MG capsule One po qAm and two po qHS Patient not taking: Reported on 01/16/2024 07/09/23   Sater, Charlie LABOR, MD  naproxen sodium (ALEVE) 220 MG tablet Take 660 mg by mouth daily.    [provider]  sertraline  (ZOLOFT ) 50 MG tablet TAKE 1 TABLET BY MOUTH EVERY DAY Patient not taking: Reported on 01/16/2024 01/23/23   Sater, Charlie LABOR, MD  silodosin  (RAPAFLO ) 8 MG CAPS capsule Take 1 capsule (8 mg total) by mouth daily with breakfast. 01/16/24   Sater, Charlie LABOR, MD    Allergies: Patient has no known allergies.    Review of Systems  Musculoskeletal:  Positive for arthralgias and myalgias.    Updated Vital Signs BP 128/79 (BP Location: Left Arm)   Pulse 91   Temp 98.2 F (36.8 C) (Oral)   Resp 16   SpO2 98%   Physical Exam Vitals and nursing note reviewed.  Constitutional:      General: He is not in acute distress.    Appearance: Normal appearance. He is well-developed.  He is not toxic-appearing.  HENT:     Head: Normocephalic and atraumatic.     Right Ear: External ear normal.     Left Ear: External ear normal.  Eyes:     Conjunctiva/sclera: Conjunctivae normal.  Cardiovascular:     Rate and Rhythm: Normal rate and regular rhythm.     Heart sounds: No murmur heard. Pulmonary:     Effort: Pulmonary effort is normal. No respiratory distress.     Breath sounds: Normal breath sounds.  Abdominal:     Palpations: Abdomen is soft.     Tenderness: There is no abdominal tenderness.  Musculoskeletal:        General: No swelling, tenderness, deformity or signs of injury.     Cervical back: Normal range of motion and neck supple. No rigidity.     Comments: Patient denies tenderness to palpation but reports it hurts inside when he moves.  Skin:    General: Skin is warm and dry.     Capillary Refill: Capillary refill takes less than 2 seconds.  Neurological:     General: No focal deficit present.     Mental Status: He is alert and oriented to person, place, and time.  Psychiatric:        Mood and Affect: Mood normal.     (all labs ordered are  listed, but only abnormal results are displayed) Labs Reviewed  BASIC METABOLIC PANEL WITH GFR - Abnormal; Notable for the following components:      Result Value   Glucose, Bld 115 (*)    All other components within normal limits  CBC WITH DIFFERENTIAL/PLATELET - Abnormal; Notable for the following components:   Neutro Abs 8.0 (*)    Lymphs Abs 0.4 (*)    All other components within normal limits  URINALYSIS, W/ REFLEX TO CULTURE (INFECTION SUSPECTED)    EKG: None  Radiology: DG Knee 1-2 Views Left Result Date: 07/19/2024 CLINICAL DATA:  Status post fall. EXAM: LEFT KNEE - 1-2 VIEW COMPARISON:  None Available. FINDINGS: No evidence of an acute fracture or dislocation. There is marked severity tricompartmental joint space narrowing. A small suprapatellar effusion is suspected. IMPRESSION: 1. Marked  severity tricompartmental degenerative changes. 2. Small suprapatellar effusion. Electronically Signed   By: Suzen Dials M.D.   On: 07/19/2024 12:56   DG Lumbar Spine 2-3 Views Result Date: 07/19/2024 CLINICAL DATA:  Status post fall EXAM: LUMBAR SPINE - 2 VIEW COMPARISON:  MRI lumbar spine dated 01/11/2024 FINDINGS: Minimal anterior wedging of T12 and L1 vertebral bodies. Alignment is normal. Intervertebral disc spaces are maintained. Partially imaged large volume stool throughout the colon. IMPRESSION: 1. Minimal anterior wedging of T12 and L1 vertebral bodies, age indeterminate. 2. Partially imaged large volume stool throughout the colon. Electronically Signed   By: Limin  Xu M.D.   On: 07/19/2024 12:55   DG Hip Unilat With Pelvis 2-3 Views Left Result Date: 07/19/2024 CLINICAL DATA:  Status post fall EXAM: DG HIP (WITH OR WITHOUT PELVIS) 3V LEFT COMPARISON:  None Available. FINDINGS: Subcapital left femoral neck fracture with severe varus angulation. No acute dislocation of the hip. IMPRESSION: Angulated subcapital left femoral neck fracture. Electronically Signed   By: Limin  Xu M.D.   On: 07/19/2024 12:53   CT Cervical Spine Wo Contrast Result Date: 07/19/2024 EXAM: CT CERVICAL SPINE WITHOUT CONTRAST 07/19/2024 12:14:16 PM TECHNIQUE: CT of the cervical spine was performed without the administration of intravenous contrast. Multiplanar reformatted images are provided for review. Automated exposure control, iterative reconstruction, and/or weight based adjustment of the mA/kV was utilized to reduce the radiation dose to as low as reasonably achievable. COMPARISON: MRI of the cervical spine 01/11/2024. CLINICAL HISTORY: Neck trauma (Age >= 65y). FINDINGS: CERVICAL SPINE: BONES AND ALIGNMENT: No acute fracture or traumatic malalignment. DEGENERATIVE CHANGES: Endplate degenerative change and facet hypertrophy are present at C4-C5. Ankylosis is present across the disc space at C4-C5, right greater  than left. Right greater than left foraminal narrowing is present at C5-C6 and C6-C7. SOFT TISSUES: No prevertebral soft tissue swelling. IMPRESSION: 1. No acute abnormality of the cervical spine related to the reported neck trauma. 2. Inplate degenerative change and facet hypertrophy at C4-5 with ankylosis across the disc space, right greater than left. 3. Foraminal narrowing at C5-6 and C6-7. Electronically signed by: Lonni Necessary MD 07/19/2024 12:22 PM EDT RP Workstation: [id number]   CT Head Wo Contrast Result Date: 07/19/2024 EXAM: CT HEAD WITHOUT CONTRAST 07/19/2024 12:14:16 PM TECHNIQUE: CT of the head was performed without the administration of intravenous contrast. Automated exposure control, iterative reconstruction, and/or weight based adjustment of the mA/kV was utilized to reduce the radiation dose to as low as reasonably achievable. COMPARISON: None available. CLINICAL HISTORY: Head trauma, minor (Age >= 65y). Pt reports while walking this morning his left leg gave out and he fell to the ground. Pt reports  hitting the left side of his face. Reports left leg pain at this time. Denies LOC. FINDINGS: BRAIN AND VENTRICLES: Paraventricular white matter changes are mildly advanced for age. This most likely reflects the sequelae of chronic microvascular ischemia. No acute hemorrhage. No evidence of acute infarct. No hydrocephalus. No extra-axial collection. No mass effect or midline shift. ORBITS: No acute abnormality. SINUSES: No acute abnormality. SOFT TISSUES AND SKULL: No acute soft tissue abnormality. No skull fracture. IMPRESSION: 1. No acute intracranial abnormality. 2. Stable Mildly advanced paraventricular white matter changes for age, likely sequelae of chronic microvascular ischemia. Electronically signed by: Lonni Necessary MD 07/19/2024 12:20 PM EDT RP Workstation: [id number]     Procedures   Medications Ordered in the ED  ondansetron [facility]) injection 4 mg (has no  administration in time range)  morphine (PF) 4 MG/ML injection 4 mg (has no administration in time range)                                    Medical Decision Making Amount and/or Complexity of Data Reviewed Labs: ordered. Radiology: ordered.   This patient presents to the ED for concern of fall differential diagnosis includes brain bleed, C-spine injury, hip dislocation, hip fracture, musculoskeletal pain, femur fracture, knee dislocation, UTI   Lab Tests:  I Ordered, and personally interpreted labs.  The pertinent results include: CBC and BMP unremarkable   Imaging Studies ordered:  I ordered imaging studies including CT head and C-spine Noncon I independently visualized and interpreted imaging which showed no acute abnormality of the C-spine related to reported neck trauma, no acute intracranial abnormality I agree with the radiologist interpretation Left hip x-ray with angulated subcapital left femoral neck fracture Lumbar spine x-ray which showed minimal anterior wedging of the T12 and L1 vertebral bodies, age-indeterminate Left knee x-ray small suprapatellar effusion   Medicines ordered and prescription drug management: I ordered medication including Morphine and zofran    I have reviewed the patients home medicines and have made adjustments as needed   Problem List / ED Course:  Consulted orthopedics, Dr. Jerri who recommended making the patient n.p.o. at midnight and hospitalist admission. Consulted neurosurgery, Dr. Darnella who stated there was nothing to do about the T1 or L1 vertebral body findings no follow-up is needed. Consulted hospitalist, Dr. Roxane who is agreeable to admission       Final diagnoses:  Closed fracture of neck of left femur, initial encounter [facility])  Fall, initial encounter    ED Discharge Orders     None          Francis Ileana LOISE DEVONNA 07/19/24 1418    Ula Prentice SAUNDERS, MD 07/19/24 1450

## 2024-07-19 NOTE — ED Triage Notes (Signed)
 Pt reports while walking this morning his left leg gave out and he fell to the ground. Pt reports hitting the left side of his face. Reports left leg pain at this time. Denies LOC.

## 2024-07-19 NOTE — Plan of Care (Signed)
 Patient admission education progressing

## 2024-07-19 NOTE — Progress Notes (Signed)
 Called regarding XR lumbar spine and age indeterminate L1 and T12 wedging  No activity restrictions, bracing, surgery, or follow-up required

## 2024-07-19 NOTE — H&P (Signed)
 History and Physical  Gabriel Hanson FMW:[id number] DOB: 07-Jan-1951 DOA: 07/19/2024  PCP: Patient, No Pcp Per   Chief Complaint: Left hip pain, fall at home  HPI: Gabriel Hanson is a 73 y.o. male with medical history significant for MS and cognitive dysfunction admitted to the hospital with left hip fracture after mechanical fall at home yesterday.  Patient lives on his own, his sister is his healthcare power of attorney and takes close care of him.  He usually ambulates with a walker, yesterday afternoon he texted his sister when she went over to visit him, she found him on the ground.  He had some left hip discomfort, but was able to bear weight.  However this morning when she went to check on him again he was still in bed and having more severe pain, said that he was unable to sleep through most of the night.  He denies any recent fevers, chills, nausea, vomiting, he thinks he did hit the left side of his face when he fell yesterday.  Evaluation in the ER as detailed below shows evidence of left femoral neck fracture.  ER provider discussed with Dr. Jerri who plans operative repair in the morning.  Review of Systems: Please see HPI for pertinent positives and negatives. A complete 10 system review of systems are otherwise negative.  Past Medical History:  Diagnosis Date   Depressive disorder, not elsewhere classified    Disorder of eye, unspecified    Memory loss    Multiple sclerosis    Other persistent mental disorders due to conditions classified elsewhere    Other psoriasis    Psychosexual dysfunction, unspecified    Past Surgical History:  Procedure Laterality Date   KNEE ARTHROSCOPY     right    TONSILLECTOMY     Social History:  reports that he has been smoking cigarettes. He has a 44 pack-year smoking history. He has never used smokeless tobacco. He reports current alcohol use. He reports current drug use. Drug: Marijuana.  No Known Allergies  Family History  Problem  Relation Age of Onset   Heart attack Mother    COPD Father    Hypotension Sister    Heart disease Other      Prior to Admission medications   Medication Sig Start Date End Date Taking? Authorizing Provider  amphetamine -dextroamphetamine  (ADDERALL) 20 MG tablet Take 1 tablet (20 mg total) by mouth 2 (two) times daily. Patient taking differently: Take 20 mg by mouth in the morning. 07/10/24  Yes Sater, Charlie LABOR, MD  [employer] SENNA 8.6 MG tablet Take 2 tablets by mouth as needed for constipation. 02/15/22  Yes [provider]  Diroximel Fumarate  (VUMERITY ) 231 MG CPDR Take 1 capsule by mouth 2 (two) times daily. 10/31/23  Yes Sater, Charlie LABOR, MD  gabapentin  (NEURONTIN ) 300 MG capsule One po qAm and two po qHS Patient taking differently: Take 300-600 mg by mouth as needed. 07/09/23  Yes Sater, Charlie LABOR, MD  naproxen sodium (ALEVE) 220 MG tablet Take 660 mg by mouth daily.   Yes [provider]  silodosin  (RAPAFLO ) 8 MG CAPS capsule Take 1 capsule (8 mg total) by mouth daily with breakfast. 01/16/24  Yes Sater, Charlie LABOR, MD  TRAMADOL HCL PO Take 1 tablet by mouth as needed.   Yes [provider]    Physical Exam: BP 128/79 (BP Location: Left Arm)   Pulse 91   Temp 98.2 F (36.8 C) (Oral)   Resp 16  SpO2 98%  General:  Alert, oriented, calm, in no acute distress, his sister is at the bedside Eyes: EOMI, clear conjuctivae, white sclerea Neck: supple, no masses, trachea mildline  Cardiovascular: RRR, no murmurs or rubs, no peripheral edema  Respiratory: clear to auscultation bilaterally, no wheezes, no crackles  Abdomen: soft, nontender, nondistended, normal bowel tones heard  Skin: dry, no rashes  Musculoskeletal: no joint effusions, normal range of motion  Psychiatric: appropriate affect, normal speech           Labs on Admission:  Basic Metabolic Panel: Recent Labs  Lab 07/19/24 1302  NA 136  K 5.0  CL 100  CO2 29  GLUCOSE 115*  BUN 14  CREATININE  0.86  CALCIUM 9.6   Liver Function Tests: No results for input(s): AST, ALT, ALKPHOS, BILITOT, PROT, ALBUMIN in the last 168 hours. No results for input(s): LIPASE, AMYLASE in the last 168 hours. No results for input(s): AMMONIA in the last 168 hours. CBC: Recent Labs  Lab 07/19/24 1302  WBC 9.2  NEUTROABS 8.0*  HGB 14.7  HCT 44.8  MCV 92.0  PLT 222   Cardiac Enzymes: No results for input(s): CKTOTAL, CKMB, CKMBINDEX, TROPONINI in the last 168 hours. BNP (last 3 results) No results for input(s): BNP in the last 8760 hours.  ProBNP (last 3 results) No results for input(s): PROBNP in the last 8760 hours.  CBG: No results for input(s): GLUCAP in the last 168 hours.  Radiological Exams on Admission: DG Knee 1-2 Views Left Result Date: 07/19/2024 CLINICAL DATA:  Status post fall. EXAM: LEFT KNEE - 1-2 VIEW COMPARISON:  None Available. FINDINGS: No evidence of an acute fracture or dislocation. There is marked severity tricompartmental joint space narrowing. A small suprapatellar effusion is suspected. IMPRESSION: 1. Marked severity tricompartmental degenerative changes. 2. Small suprapatellar effusion. Electronically Signed   By: Suzen Dials M.D.   On: 07/19/2024 12:56   DG Lumbar Spine 2-3 Views Result Date: 07/19/2024 CLINICAL DATA:  Status post fall EXAM: LUMBAR SPINE - 2 VIEW COMPARISON:  MRI lumbar spine dated 01/11/2024 FINDINGS: Minimal anterior wedging of T12 and L1 vertebral bodies. Alignment is normal. Intervertebral disc spaces are maintained. Partially imaged large volume stool throughout the colon. IMPRESSION: 1. Minimal anterior wedging of T12 and L1 vertebral bodies, age indeterminate. 2. Partially imaged large volume stool throughout the colon. Electronically Signed   By: Limin  Xu M.D.   On: 07/19/2024 12:55   DG Hip Unilat With Pelvis 2-3 Views Left Result Date: 07/19/2024 CLINICAL DATA:  Status post fall EXAM: DG HIP (WITH OR  WITHOUT PELVIS) 3V LEFT COMPARISON:  None Available. FINDINGS: Subcapital left femoral neck fracture with severe varus angulation. No acute dislocation of the hip. IMPRESSION: Angulated subcapital left femoral neck fracture. Electronically Signed   By: Limin  Xu M.D.   On: 07/19/2024 12:53   CT Cervical Spine Wo Contrast Result Date: 07/19/2024 EXAM: CT CERVICAL SPINE WITHOUT CONTRAST 07/19/2024 12:14:16 PM TECHNIQUE: CT of the cervical spine was performed without the administration of intravenous contrast. Multiplanar reformatted images are provided for review. Automated exposure control, iterative reconstruction, and/or weight based adjustment of the mA/kV was utilized to reduce the radiation dose to as low as reasonably achievable. COMPARISON: MRI of the cervical spine 01/11/2024. CLINICAL HISTORY: Neck trauma (Age >= 65y). FINDINGS: CERVICAL SPINE: BONES AND ALIGNMENT: No acute fracture or traumatic malalignment. DEGENERATIVE CHANGES: Endplate degenerative change and facet hypertrophy are present at C4-C5. Ankylosis is present across the disc space at  C4-C5, right greater than left. Right greater than left foraminal narrowing is present at C5-C6 and C6-C7. SOFT TISSUES: No prevertebral soft tissue swelling. IMPRESSION: 1. No acute abnormality of the cervical spine related to the reported neck trauma. 2. Inplate degenerative change and facet hypertrophy at C4-5 with ankylosis across the disc space, right greater than left. 3. Foraminal narrowing at C5-6 and C6-7. Electronically signed by: Lonni Necessary MD 07/19/2024 12:22 PM EDT RP Workstation: [id number]   CT Head Wo Contrast Result Date: 07/19/2024 EXAM: CT HEAD WITHOUT CONTRAST 07/19/2024 12:14:16 PM TECHNIQUE: CT of the head was performed without the administration of intravenous contrast. Automated exposure control, iterative reconstruction, and/or weight based adjustment of the mA/kV was utilized to reduce the radiation dose to as low as  reasonably achievable. COMPARISON: None available. CLINICAL HISTORY: Head trauma, minor (Age >= 65y). Pt reports while walking this morning his left leg gave out and he fell to the ground. Pt reports hitting the left side of his face. Reports left leg pain at this time. Denies LOC. FINDINGS: BRAIN AND VENTRICLES: Paraventricular white matter changes are mildly advanced for age. This most likely reflects the sequelae of chronic microvascular ischemia. No acute hemorrhage. No evidence of acute infarct. No hydrocephalus. No extra-axial collection. No mass effect or midline shift. ORBITS: No acute abnormality. SINUSES: No acute abnormality. SOFT TISSUES AND SKULL: No acute soft tissue abnormality. No skull fracture. IMPRESSION: 1. No acute intracranial abnormality. 2. Stable Mildly advanced paraventricular white matter changes for age, likely sequelae of chronic microvascular ischemia. Electronically signed by: Lonni Necessary MD 07/19/2024 12:20 PM EDT RP Workstation: [id number]   Assessment/Plan Gabriel Hanson is a 73 y.o. male with medical history significant for MS and cognitive dysfunction admitted to the hospital with left hip fracture after mechanical fall at home yesterday.    Left femoral neck fracture-after unwitnessed mechanical fall at home yesterday, patient states he might of hit his head.  Thankfully head CT negative and no other injury. -Inpatient admission -N.p.o. after midnight -Pain and nausea control as needed -Nonweightbearing status -Orthopedics Dr. Jerri aware and plans surgical repair in the morning  Anterior lumbar wedge fractures-not particularly symptomatic, sister states that he has some known spinal abnormality in the lumbar region that his neurologist has mentioned in the past, so suspect this is chronic.  Regardless ER provider discussed with neurosurgery who does not recommend any further treatment or evaluation.  MS-continue Diroximel twice daily  BPH-Flomax   DVT  prophylaxis: Lovenox      Code Status: Full Code  Consults called: Orthopedic surgery  Admission status: The appropriate patient status for this patient is INPATIENT. Inpatient status is judged to be reasonable and necessary in order to provide the required intensity of service to ensure the patient's safety. The patient's presenting symptoms, physical exam findings, and initial radiographic and laboratory data in the context of their chronic comorbidities is felt to place them at high risk for further clinical deterioration. Furthermore, it is not anticipated that the patient will be medically stable for discharge from the hospital within 2 midnights of admission.    I certify that at the point of admission it is my clinical judgment that the patient will require inpatient hospital care spanning beyond 2 midnights from the point of admission due to high intensity of service, high risk for further deterioration and high frequency of surveillance required  Time spent: 56 minutes  Gabriel Petrosian CHRISTELLA Gail MD [facility] Pager [phone]  If 7PM-7AM, please contact night-coverage [email or web address] Password  [email or web address]  07/19/2024, 2:57 PM

## 2024-07-20 ENCOUNTER — Encounter (HOSPITAL_COMMUNITY)
Admission: EM | Disposition: A | Discharge status: Skilled Nursing Facility | Payer: Self-pay | Source: Home / Self Care | Attending: Internal Medicine

## 2024-07-20 ENCOUNTER — Inpatient Hospital Stay (HOSPITAL_COMMUNITY): Payer: Self-pay | Admitting: Anesthesiology

## 2024-07-20 ENCOUNTER — Encounter (HOSPITAL_COMMUNITY): Payer: Self-pay | Admitting: Internal Medicine

## 2024-07-20 ENCOUNTER — Other Ambulatory Visit: Payer: Self-pay

## 2024-07-20 ENCOUNTER — Inpatient Hospital Stay (HOSPITAL_COMMUNITY)

## 2024-07-20 DIAGNOSIS — S72012A Unspecified intracapsular fracture of left femur, initial encounter for closed fracture: Secondary | ICD-10-CM

## 2024-07-20 DIAGNOSIS — M1612 Unilateral primary osteoarthritis, left hip: Secondary | ICD-10-CM

## 2024-07-20 DIAGNOSIS — F32A Depression, unspecified: Secondary | ICD-10-CM | POA: Diagnosis not present

## 2024-07-20 DIAGNOSIS — Z471 Aftercare following joint replacement surgery: Secondary | ICD-10-CM | POA: Diagnosis not present

## 2024-07-20 DIAGNOSIS — Z96642 Presence of left artificial hip joint: Secondary | ICD-10-CM | POA: Diagnosis not present

## 2024-07-20 HISTORY — PX: TOTAL HIP ARTHROPLASTY: SHX124

## 2024-07-20 LAB — CBC
HCT: 40.8 % (ref 39.0–52.0)
Hemoglobin: 13.2 g/dL (ref 13.0–17.0)
MCH: 30.5 pg (ref 26.0–34.0)
MCHC: 32.4 g/dL (ref 30.0–36.0)
MCV: 94.2 fL (ref 80.0–100.0)
Platelets: 179 K/uL (ref 150–400)
RBC: 4.33 MIL/uL (ref 4.22–5.81)
RDW: 13.1 % (ref 11.5–15.5)
WBC: 11.9 K/uL — ABNORMAL HIGH (ref 4.0–10.5)
nRBC: 0 % (ref 0.0–0.2)

## 2024-07-20 LAB — URINALYSIS, W/ REFLEX TO CULTURE (INFECTION SUSPECTED)
Bilirubin Urine: NEGATIVE
Glucose, UA: NEGATIVE mg/dL
Hgb urine dipstick: NEGATIVE
Ketones, ur: NEGATIVE mg/dL
Nitrite: NEGATIVE
Protein, ur: NEGATIVE mg/dL
Specific Gravity, Urine: 1.018 (ref 1.005–1.030)
WBC, UA: >50 WBC/hpf (ref 0–5)
pH: 5 (ref 5.0–8.0)

## 2024-07-20 LAB — CREATININE, SERUM
Creatinine, Ser: 0.68 mg/dL (ref 0.61–1.24)
GFR, Estimated: >60 mL/min (ref >60)

## 2024-07-20 SURGERY — ARTHROPLASTY, HIP, TOTAL, ANTERIOR APPROACH
Anesthesia: Spinal | Site: Hip | Laterality: Left

## 2024-07-20 MED ORDER — HYDROCODONE-ACETAMINOPHEN 7.5-325 MG PO TABS: 1.0000 | ORAL_TABLET | Freq: Every day | ORAL | Status: DC | PRN

## 2024-07-20 MED ORDER — DROPERIDOL 2.5 MG/ML IJ SOLN: 0.6250 mg | Freq: Once | INTRAMUSCULAR | Status: DC | PRN

## 2024-07-20 MED ORDER — TRANEXAMIC ACID 1000 MG/10ML IV SOLN
INTRAVENOUS | Status: AC
  Filled 2024-07-20: qty 10

## 2024-07-20 MED ORDER — TRANEXAMIC ACID-NACL 1000-0.7 MG/100ML-% IV SOLN
INTRAVENOUS | Status: DC | PRN
  Administered 2024-07-20: 1000 mg via INTRAVENOUS

## 2024-07-20 MED ORDER — ACETAMINOPHEN 10 MG/ML IV SOLN: 1000.0000 mg | Freq: Once | INTRAVENOUS | Status: DC | PRN

## 2024-07-20 MED ORDER — PHENYLEPHRINE HCL-NACL 20-0.9 MG/250ML-% IV SOLN
INTRAVENOUS | Status: DC | PRN
  Administered 2024-07-20: 25 ug/min via INTRAVENOUS

## 2024-07-20 MED ORDER — FENTANYL CITRATE (PF) 100 MCG/2ML IJ SOLN
INTRAMUSCULAR | Status: AC
  Filled 2024-07-20: qty 2

## 2024-07-20 MED ORDER — ISOPROPYL ALCOHOL 70 % SOLN
Status: DC | PRN
  Administered 2024-07-20: 1 via TOPICAL

## 2024-07-20 MED ORDER — ONDANSETRON HCL 4 MG/2ML IJ SOLN
INTRAMUSCULAR | Status: DC | PRN
  Administered 2024-07-20: 4 mg via INTRAVENOUS

## 2024-07-20 MED ORDER — METHOCARBAMOL 500 MG PO TABS
500.0000 mg | ORAL_TABLET | Freq: Three times a day (TID) | ORAL | Status: DC | PRN
  Administered 2024-07-20: 500 mg via ORAL
  Filled 2024-07-20: qty 1

## 2024-07-20 MED ORDER — LACTATED RINGERS IV SOLN: INTRAVENOUS | Status: DC | PRN

## 2024-07-20 MED ORDER — TRANEXAMIC ACID-NACL 1000-0.7 MG/100ML-% IV SOLN
INTRAVENOUS | Status: AC
  Filled 2024-07-20: qty 100

## 2024-07-20 MED ORDER — FENTANYL CITRATE (PF) 50 MCG/ML IJ SOSY: 25.0000 ug | PREFILLED_SYRINGE | INTRAMUSCULAR | Status: DC | PRN

## 2024-07-20 MED ORDER — OXYCODONE HCL 5 MG PO TABS: 5.0000 mg | ORAL_TABLET | Freq: Once | ORAL | Status: DC | PRN

## 2024-07-20 MED ORDER — ISOPROPYL ALCOHOL 70 % SOLN
Status: AC
  Filled 2024-07-20: qty 480

## 2024-07-20 MED ORDER — TRANEXAMIC ACID-NACL 1000-0.7 MG/100ML-% IV SOLN
1000.0000 mg | Freq: Once | INTRAVENOUS | Status: AC
  Administered 2024-07-20: 1000 mg via INTRAVENOUS
  Filled 2024-07-20: qty 100

## 2024-07-20 MED ORDER — DEXAMETHASONE SOD PHOSPHATE PF 10 MG/ML IJ SOLN
INTRAMUSCULAR | Status: DC | PRN
  Administered 2024-07-20: 10 mg via INTRAVENOUS

## 2024-07-20 MED ORDER — MORPHINE SULFATE (PF) 2 MG/ML IV SOLN: 0.5000 mg | INTRAVENOUS | Status: DC | PRN

## 2024-07-20 MED ORDER — ENOXAPARIN SODIUM 40 MG/0.4ML IJ SOSY: 40.0000 mg | PREFILLED_SYRINGE | Freq: Every day | INTRAMUSCULAR | 0 refills | Status: AC

## 2024-07-20 MED ORDER — OXYCODONE HCL 5 MG/5ML PO SOLN: 5.0000 mg | Freq: Once | ORAL | Status: DC | PRN

## 2024-07-20 MED ORDER — CEFAZOLIN SODIUM-DEXTROSE 2-4 GM/100ML-% IV SOLN
INTRAVENOUS | Status: AC
  Filled 2024-07-20: qty 100

## 2024-07-20 MED ORDER — METHOCARBAMOL 1000 MG/10ML IJ SOLN: 500.0000 mg | Freq: Three times a day (TID) | INTRAMUSCULAR | Status: DC | PRN

## 2024-07-20 MED ORDER — SODIUM CHLORIDE (PF) 0.9 % IJ SOLN
INTRAMUSCULAR | Status: AC
  Filled 2024-07-20: qty 50

## 2024-07-20 MED ORDER — BUPIVACAINE-MELOXICAM ER 400-12 MG/14ML IJ SOLN
INTRAMUSCULAR | Status: AC
  Filled 2024-07-20: qty 1

## 2024-07-20 MED ORDER — SODIUM CHLORIDE 0.9 % IR SOLN
Status: DC | PRN
  Administered 2024-07-20: 1000 mL

## 2024-07-20 MED ORDER — 0.9 % SODIUM CHLORIDE (POUR BTL) OPTIME
TOPICAL | Status: DC | PRN
  Administered 2024-07-20: 1000 mL

## 2024-07-20 MED ORDER — ONDANSETRON HCL 4 MG/2ML IJ SOLN
INTRAMUSCULAR | Status: AC
  Filled 2024-07-20: qty 2

## 2024-07-20 MED ORDER — PROPOFOL 500 MG/50ML IV EMUL
INTRAVENOUS | Status: AC
  Filled 2024-07-20: qty 50

## 2024-07-20 MED ORDER — ONDANSETRON HCL 4 MG PO TABS: 4.0000 mg | ORAL_TABLET | Freq: Four times a day (QID) | ORAL | Status: DC | PRN

## 2024-07-20 MED ORDER — BUPIVACAINE-MELOXICAM ER 200-6 MG/7ML IJ SOLN
INTRAMUSCULAR | Status: DC | PRN
  Administered 2024-07-20: 400 mg

## 2024-07-20 MED ORDER — TRANEXAMIC ACID 1000 MG/10ML IV SOLN
2000.0000 mg | Freq: Once | INTRAVENOUS | Status: AC
  Administered 2024-07-20: 2000 mg via TOPICAL
  Filled 2024-07-20: qty 20

## 2024-07-20 MED ORDER — HYDROCODONE-ACETAMINOPHEN 5-325 MG PO TABS
1.0000 | ORAL_TABLET | Freq: Two times a day (BID) | ORAL | Status: DC | PRN
  Administered 2024-07-20: 1 via ORAL
  Filled 2024-07-20: qty 1

## 2024-07-20 MED ORDER — MAGNESIUM CITRATE PO SOLN: 1.0000 | Freq: Once | ORAL | Status: DC | PRN

## 2024-07-20 MED ORDER — CEFAZOLIN SODIUM-DEXTROSE 2-3 GM-%(50ML) IV SOLR
INTRAVENOUS | Status: DC | PRN
  Administered 2024-07-20: 2 g via INTRAVENOUS

## 2024-07-20 MED ORDER — DOCUSATE SODIUM 100 MG PO CAPS
100.0000 mg | ORAL_CAPSULE | Freq: Two times a day (BID) | ORAL | Status: DC
  Administered 2024-07-21 – 2024-07-23 (×5): 100 mg via ORAL
  Filled 2024-07-20 (×5): qty 1

## 2024-07-20 MED ORDER — BUPIVACAINE IN DEXTROSE 0.75-8.25 % IT SOLN
INTRATHECAL | Status: DC | PRN
  Administered 2024-07-20: 1.8 mL via INTRATHECAL

## 2024-07-20 MED ORDER — MENTHOL 3 MG MT LOZG: 1.0000 | LOZENGE | OROMUCOSAL | Status: DC | PRN

## 2024-07-20 MED ORDER — POLYETHYLENE GLYCOL 3350 17 G PO PACK: 17.0000 g | PACK | Freq: Every day | ORAL | Status: DC | PRN

## 2024-07-20 MED ORDER — ACETAMINOPHEN 10 MG/ML IV SOLN
INTRAVENOUS | Status: DC | PRN
  Administered 2024-07-20: 1000 mg via INTRAVENOUS

## 2024-07-20 MED ORDER — CEFAZOLIN SODIUM-DEXTROSE 2-4 GM/100ML-% IV SOLN
2.0000 g | Freq: Four times a day (QID) | INTRAVENOUS | Status: AC
  Administered 2024-07-20 – 2024-07-21 (×3): 2 g via INTRAVENOUS
  Filled 2024-07-20 (×3): qty 100

## 2024-07-20 MED ORDER — PROPOFOL 500 MG/50ML IV EMUL
INTRAVENOUS | Status: DC | PRN
  Administered 2024-07-20: 70 ug/kg/min via INTRAVENOUS

## 2024-07-20 MED ORDER — PROPOFOL 10 MG/ML IV BOLUS
INTRAVENOUS | Status: DC | PRN
  Administered 2024-07-20: 50 mg via INTRAVENOUS

## 2024-07-20 MED ORDER — PROPOFOL 1000 MG/100ML IV EMUL
INTRAVENOUS | Status: AC
Start: 2024-07-20 — End: 2024-07-20
  Filled 2024-07-20: qty 100

## 2024-07-20 MED ORDER — FENTANYL CITRATE (PF) 100 MCG/2ML IJ SOLN
INTRAMUSCULAR | Status: DC | PRN
  Administered 2024-07-20: 100 ug via INTRAVENOUS

## 2024-07-20 MED ORDER — VANCOMYCIN HCL 1000 MG IV SOLR
INTRAVENOUS | Status: AC
  Filled 2024-07-20: qty 20

## 2024-07-20 MED ORDER — ACETAMINOPHEN 500 MG PO TABS
500.0000 mg | ORAL_TABLET | Freq: Four times a day (QID) | ORAL | Status: AC
  Filled 2024-07-20: qty 1

## 2024-07-20 MED ORDER — OXYCODONE-ACETAMINOPHEN 5-325 MG PO TABS: 1.0000 | ORAL_TABLET | Freq: Two times a day (BID) | ORAL | 0 refills | Status: DC | PRN

## 2024-07-20 MED ORDER — ACETAMINOPHEN 325 MG PO TABS: 325.0000 mg | ORAL_TABLET | Freq: Four times a day (QID) | ORAL | Status: DC | PRN

## 2024-07-20 MED ORDER — SORBITOL 70 % SOLN: 30.0000 mL | Freq: Every day | Status: DC | PRN

## 2024-07-20 MED ORDER — ALUM & MAG HYDROXIDE-SIMETH 200-200-20 MG/5ML PO SUSP: 30.0000 mL | ORAL | Status: DC | PRN

## 2024-07-20 MED ORDER — PHENOL 1.4 % MT LIQD: 1.0000 | OROMUCOSAL | Status: DC | PRN

## 2024-07-20 MED ORDER — ONDANSETRON HCL 4 MG/2ML IJ SOLN: 4.0000 mg | Freq: Four times a day (QID) | INTRAMUSCULAR | Status: DC | PRN

## 2024-07-20 MED ORDER — ENOXAPARIN SODIUM 40 MG/0.4ML IJ SOSY
40.0000 mg | PREFILLED_SYRINGE | INTRAMUSCULAR | Status: DC
  Administered 2024-07-21 – 2024-07-23 (×3): 40 mg via SUBCUTANEOUS
  Filled 2024-07-20 (×3): qty 0.4

## 2024-07-20 MED ORDER — ACETAMINOPHEN 10 MG/ML IV SOLN
INTRAVENOUS | Status: AC
  Filled 2024-07-20: qty 100

## 2024-07-20 MED ORDER — VANCOMYCIN HCL 1 G IV SOLR
INTRAVENOUS | Status: DC | PRN
  Administered 2024-07-20: 1000 mg via TOPICAL

## 2024-07-20 MED ORDER — SODIUM CHLORIDE 0.9 % IV SOLN: INTRAVENOUS | Status: DC

## 2024-07-20 SURGICAL SUPPLY — 45 items
BAG COUNTER SPONGE SURGICOUNT (BAG) IMPLANT
BAG ZIPLOCK 12X15 (MISCELLANEOUS) ×1 IMPLANT
BLADE SAG 18X100X1.27 (BLADE) ×1 IMPLANT
CLSR STERI-STRIP ANTIMIC 1/2X4 (GAUZE/BANDAGES/DRESSINGS) IMPLANT
COVER PERINEAL POST (MISCELLANEOUS) ×1 IMPLANT
COVER SURGICAL LIGHT HANDLE (MISCELLANEOUS) ×1 IMPLANT
CUP ACETAB W/GRIPTION 54 (Plate) IMPLANT
DERMABOND ADVANCED .7 DNX12 (GAUZE/BANDAGES/DRESSINGS) IMPLANT
DRAPE FOOT SWITCH (DRAPES) ×1 IMPLANT
DRAPE IMP U-DRAPE 54X76 (DRAPES) ×1 IMPLANT
DRAPE POUCH INSTRU U-SHP 10X18 (DRAPES) ×1 IMPLANT
DRAPE STERI IOBAN 125X83 (DRAPES) ×1 IMPLANT
DRAPE TOP 10253 STERILE (DRAPES) ×2 IMPLANT
DRAPE U-SHAPE 47X51 STRL (DRAPES) ×1 IMPLANT
DRESSING AQUACEL AG SP 3.5X6 (GAUZE/BANDAGES/DRESSINGS) ×1 IMPLANT
DRSG AQUACEL AG ADV 3.5X10 (GAUZE/BANDAGES/DRESSINGS) ×1 IMPLANT
DURAPREP 26ML APPLICATOR (WOUND CARE) ×2 IMPLANT
ELECT PENCIL ROCKER SW 15FT (MISCELLANEOUS) ×1 IMPLANT
ELECT REM PT RETURN 15FT ADLT (MISCELLANEOUS) ×1 IMPLANT
GLOVE BIOGEL PI IND STRL 7.0 (GLOVE) ×1 IMPLANT
GLOVE BIOGEL PI IND STRL 7.5 (GLOVE) ×1 IMPLANT
GLOVE ECLIPSE 7.0 STRL STRAW (GLOVE) ×3 IMPLANT
GLOVE SURG SYN 7.5 PF PI (GLOVE) ×2 IMPLANT
GOWN SRG XL LVL 4 BRTHBL STRL (GOWNS) ×2 IMPLANT
HEAD ARTICULEZE 36 12 (Hips) IMPLANT
HOOD PEEL AWAY T7 (MISCELLANEOUS) ×3 IMPLANT
KIT TURNOVER KIT A (KITS) ×1 IMPLANT
LINER NEUTRAL 54X36MM PLUS 4 (Hips) IMPLANT
MARKER SKIN DUAL TIP RULER LAB (MISCELLANEOUS) ×1 IMPLANT
NDL SPNL 18GX3.5 QUINCKE PK (NEEDLE) ×1 IMPLANT
NEEDLE SPNL 18GX3.5 QUINCKE PK (NEEDLE) ×1 IMPLANT
PACK ANTERIOR HIP CUSTOM (KITS) ×1 IMPLANT
SCREW 6.5MMX30MM (Screw) IMPLANT
SET HNDPC FAN SPRY TIP SCT (DISPOSABLE) ×1 IMPLANT
SOLUTION PRONTOSAN WOUND 350ML (IRRIGATION / IRRIGATOR) ×1 IMPLANT
STEM FEMORAL SZ8 STD ACTIS (Stem) IMPLANT
SUT ETHIBOND 2 V 37 (SUTURE) ×1 IMPLANT
SUT ETHILON 2 0 PS N (SUTURE) ×1 IMPLANT
SUT MNCRL AB 3-0 PS2 18 (SUTURE) IMPLANT
SUT NYLON 3 0 (SUTURE) IMPLANT
SUT STRATAFIX PDS+ 0 24IN (SUTURE) ×1 IMPLANT
SUT VIC AB 0 CT1 36 (SUTURE) IMPLANT
SUT VIC AB 2-0 CT1 TAPERPNT 27 (SUTURE) ×2 IMPLANT
TRAY CATH INTERMITTENT SS 16FR (CATHETERS) IMPLANT
TUBE SUCTION HIGH CAP CLEAR NV (SUCTIONS) ×1 IMPLANT

## 2024-07-20 NOTE — Plan of Care (Signed)
   Problem: Activity: Goal: Risk for activity intolerance will decrease Outcome: Progressing   Problem: Pain Managment: Goal: General experience of comfort will improve and/or be controlled Outcome: Progressing   Problem: Safety: Goal: Ability to remain free from injury will improve Outcome: Progressing

## 2024-07-20 NOTE — Transfer of Care (Signed)
 Immediate Anesthesia Transfer of Care Note  Patient: Gabriel Hanson  Procedure(s) Performed: ARTHROPLASTY, HIP, TOTAL, ANTERIOR APPROACH (Left: Hip)  Patient Location: PACU  Anesthesia Type:Spinal  Level of Consciousness: awake, alert , and oriented  Airway & Oxygen Therapy: Patient Spontanous Breathing  Post-op Assessment: Report given to RN and Post -op Vital signs reviewed and stable  Post vital signs: Reviewed and stable  Last Vitals:  Vitals Value Taken Time  BP 116/79 07/20/24 09:39  Temp    Pulse 79 07/20/24 09:40  Resp 14 07/20/24 09:40  SpO2 88 % 07/20/24 09:40  Vitals shown include unfiled device data.  Last Pain:  Vitals:   07/20/24 0625  TempSrc: Oral  PainSc:          Complications: No notable events documented.

## 2024-07-20 NOTE — Progress Notes (Signed)
 Patient transported to pre op holding, sister also present, report called to pre op holding

## 2024-07-20 NOTE — Op Note (Signed)
 ARTHROPLASTY, HIP, TOTAL, ANTERIOR APPROACH  Procedure Note YORDIN RHODA   [id number]  Pre-op Diagnosis: LEFT TOTAL HIP ARTHROPLASTY     Post-op Diagnosis: same  Operative Findings Acute femoral neck fracture, osteopenia Pre-existing OA   Operative Procedures  1. Total hip replacement; Left hip; uncemented [id number]   Surgeon: Kay Cummins, M.D.  Assist: Ronal Morna Grave, PA-C   Anesthesia: spinal  Prosthesis: Depuy Acetabulum: Pinnacle 54 mm Femur: Actis 8 STD Head: 36 mm size: +12 Liner: +4 neutral Bearing Type: metal/poly  Total Hip Arthroplasty (Anterior Approach) Op Note:  After informed consent was obtained and the operative extremity marked in the holding area, the patient was brought back to the operating room and placed supine on the HANA table. Next, the operative extremity was prepped and draped in normal sterile fashion. Surgical timeout occurred verifying patient identification, surgical site, surgical procedure and administration of antibiotics.  A 10 cm longitudinal incision was made starting from 2 fingerbreadths lateral and inferior to the ASIS towards the lateral aspect of the patella.  A Hueter approach to the hip was performed, using the interval between tensor fascia lata and sartorius.  Dissection was carried bluntly down onto the anterior hip capsule. The lateral femoral circumflex vessels were identified and coagulated. A capsulotomy was performed and the capsular flaps tagged for later repair.  The neck osteotomy was performed 1 fingerbreadth above the lesser trochanter. The femoral head was removed which showed high grade chondral wear, the acetabular rim was cleared of soft tissue and osteophytes and attention was turned to reaming the acetabulum.  There was evidence of DJD in the acetabulum. Sequential reaming was performed under fluoroscopic guidance down to the floor of the cotyloid fossa. We reamed to a size 54 mm, and then impacted the acetabular  shell. A 30 mm cancellous screw was placed to secure the shell.  A +4 neutral liner was then placed after irrigation and attention turned to the femur.  After placing the femoral hook, the leg was taken to externally rotated, extended and adducted position taking care to perform soft tissue releases to allow for adequate mobilization of the femur. Soft tissue was cleared from the shoulder of the greater trochanter and the hook elevator used to improve exposure of the proximal femur.  Lateral bone from the shoulder was rasped away for relief.  Sequential broaching performed up to a size 8.  Initially a high offset trial neck and +1.5 head were placed. The leg was brought back up to neutral and the construct reduced.  The position and sizing of components, offset and leg lengths were checked using fluoroscopy.  Based on fluoroscopic findings, we chose to retrial with standard neck and +12 head ball.  Stability of the construct was checked in 45 degrees of hip extension and 90 degrees of external rotation without any subluxation, shuck or impingement of prosthesis. We dislocated the prosthesis, dropped the leg back into position, removed trial components, and irrigated copiously. The final stem and head were chosen then placed, the leg brought back up, the system reduced and fluoroscopy used to verify positioning.  Antibiotic irrigation was placed in the surgical wound.   We irrigated, obtained hemostasis and closed the capsule using #2 ethibond suture.  A topical mixture of 0.25% bupivacaine and meloxicam was placed deep to the fascia.  One gram of vancomycin  powder was placed in the surgical bed.   One gram of topical tranexamic acid was injected into the joint.  The fascia was closed  with #1 stratafix, the deep fat layer was closed with 0 vicryl, the subcutaneous layers closed with 2.0 Vicryl Plus and the skin closed with 2.0 nylon and dermabond. A sterile dressing was applied. The patient was awakened in the  operating room and taken to recovery in stable condition.  All sponge, needle, and instrument counts were correct at the end of the case.   Morna Grave, my PA, was a medical necessity for opening, closing, limb positioning, retracting, exposing, and overall facilitation and timely completion of the surgery.  Position: supine  Complications: see description of procedure.  Time Out: performed   Drains/Packing: none  Estimated blood loss: see anesthesia record  Returned to Recovery Room: in good condition.   Antibiotics: yes   Mechanical VTE (DVT) Prophylaxis: sequential compression devices, TED thigh-high  Chemical VTE (DVT) Prophylaxis: lovenox  POD 1   Fluid Replacement: see anesthesia record  Specimens Removed: 1 to pathology   Sponge and Instrument Count Correct? yes   PACU: portable radiograph - low AP   Plan/RTC: Return in 2 weeks for suture removal. Weight Bearing/Load Lower Extremity: full  Hip precautions: none Suture Removal: 2 weeks   N. [name], MD [facility] 9:02 AM   Implant Name Type Inv. Item Serial No. Manufacturer Lot No. LRB No. Used Action  CUP ACETAB W/GRIPTION 54 - [id number] Plate CUP ACETAB W/GRIPTION 54  [employer] [id number] Left 1 Implanted  SCREW 6.5MMX30MM - [id number] Screw SCREW 6.5MMX30MM  [employer] [id number] Left 1 Implanted  LINER NEUTRAL 54X36MM PLUS 4 - [id number] Hips LINER NEUTRAL 54X36MM PLUS 4  [employer]PU[employer] [id number] Left 1 Implanted  STEM FEMORAL SZ8 STD ACTIS - [id number] Stem STEM FEMORAL SZ8 STD ACTIS  [employer] [id number] Left 1 Implanted  HEAD ARTICULEZE 36 12 - [id number] Hips HEAD ARTICULEZE 36 12  [employer] [id number] Left 1 Implanted

## 2024-07-20 NOTE — Anesthesia Postprocedure Evaluation (Signed)
 Anesthesia Post Note  Patient: Gabriel Hanson  Procedure(s) Performed: ARTHROPLASTY, HIP, TOTAL, ANTERIOR APPROACH (Left: Hip)     Patient location during evaluation: PACU Anesthesia Type: Spinal Level of consciousness: awake and alert Pain management: pain level controlled Vital Signs Assessment: post-procedure vital signs reviewed and stable Respiratory status: spontaneous breathing, nonlabored ventilation, respiratory function stable and patient connected to nasal cannula oxygen Cardiovascular status: blood pressure returned to baseline and stable Postop Assessment: no apparent nausea or vomiting Anesthetic complications: no   No notable events documented.  Last Vitals:  Vitals:   07/20/24 1015 07/20/24 1039  BP: 128/71 (!) 120/52  Pulse: 69 75  Resp: 17 18  Temp: (!) 36.2 C 36.6 C  SpO2: 97%     Last Pain:  Vitals:   07/20/24 1039  TempSrc: Oral  PainSc: 0-No pain                 Thom JONELLE Peoples

## 2024-07-20 NOTE — Progress Notes (Signed)
 Progress Note   Patient: Gabriel Hanson FMW:[id number] DOB: [birth year] DOA: 07/19/2024     1 DOS: the patient was seen and examined on 07/20/2024   Brief hospital course: 73yo with h/o MS with cognitive dysfunction who presented with a mechanical fall and was found to have a L hip fracture.  He lives alone and ambulates with a walker at baseline.  Total hip arthroplasty with Dr. Jerri on 10/12.  Assessment and Plan:  Left femoral neck fracture Unwitnessed mechanical fall at home yesterday Head CT negative and no other injuries noted Admitted to med surg Orthopedics performing total hip arthroplasty today   Anterior lumbar wedge fractures Not particularly symptomatic Sister states that he has some known spinal abnormality in the lumbar region that his neurologist has mentioned in the past, so suspect this is chronic Discussed with neurosurgery, who does not recommend any further treatment or evaluation   MS with ambulatory dysfunction, cognitive impairment Continue Diroximel twice daily Ambulates short distances with a walker at home prior to fall   BPH Continue silodosin  (tamsulosin  per formulary)  ADHD Hold Adderall - he will not be performing tasks that require attention and focus as an inpatient     Consultants: Orthopedics  Procedures: Total hip arthroplasty 10/12  Antibiotics: Cefazolin x 3 doses  30 Day Unplanned Readmission Risk Score    Flowsheet Row ED to Hosp-Admission (Current) from 07/19/2024 in [place] PERIOPERATIVE AREA  30 Day Unplanned Readmission Risk Score (%) 5.75 Filed at 07/20/2024 0801    This score is the patient's risk of an unplanned readmission within 30 days of being discharged (0 -100%). The score is based on dignosis, age, lab data, medications, orders, and past utilization.   Low:  0-14.9   Medium: 15-21.9   High: 22-29.9   Extreme: 30 and above           Subjective: Feeling ok post-operatively, no hip pain at  rest.   Objective: Vitals:   07/20/24 1039 07/20/24 1249  BP: (!) 120/52 129/73  Pulse: 75 77  Resp: 18 16  Temp: 97.8 F (36.6 C) (!) 97.5 F (36.4 C)  SpO2:  94%    Intake/Output Summary (Last 24 hours) at 07/20/2024 1301 Last data filed at 07/20/2024 9057 Gross per 24 hour  Intake 2110 ml  Output 1250 ml  Net 860 ml   Filed Weights   07/19/24 2140  Weight: 81 kg    Exam:  General:  Appears calm and comfortable and is in NAD Eyes:  normal lids, iris; poor eye contact ENT:  grossly normal hearing, lips & tongue, mmm Cardiovascular:  RRR. No LE edema.  Respiratory:   CTA bilaterally with no wheezes/rales/rhonchi.  Normal respiratory effort. Abdomen:  soft, NT, ND Skin:  no rash or induration seen on limited exam Musculoskeletal:  L hip bandage is C/D/I, no gross deformity appreciated Psychiatric: blunted mood and affect, speech fluent and appropriate, AOx3 Neurologic:  CN 2-12 grossly intact, moves all extremities in coordinated fashion other than  LLE  Data Reviewed: I have reviewed the patient's lab results since admission.  Pertinent labs for today include:   Stable on admission, none today     Family Communication: None present - his sister left just after he came to the room, but spoke with ortho today  Mobility: PT/OT Consulted      Code Status: Full Code   Disposition: Status is: Inpatient Remains inpatient appropriate because: ongoing management     Time spent: 50 minutes  Unresulted Labs (From admission, onward)     Start     Ordered   07/21/24 0500  CBC  Daily,   R     Comments: For 3 days.    07/20/24 1116   07/21/24 0500  Basic metabolic panel  Daily,   R     Comments: For 2 days .    07/20/24 1116   07/20/24 1117  Creatinine, serum  (enoxaparin  (LOVENOX )  CrCl >/= 30 mL/min  )  Once,   R       Comments: Baseline for enoxaparin  therapy IF NOT ALREADY DRAWN.    07/20/24 1116   07/19/24 1636  Urine Culture  Once,   R         07/19/24 1636             Author: Delon Herald, MD 07/20/2024 1:01 PM  For on call review [email or web address].

## 2024-07-20 NOTE — Discharge Instructions (Signed)

## 2024-07-20 NOTE — Hospital Course (Signed)
 73yo with h/o MS with cognitive dysfunction who presented with a mechanical fall and was found to have a L hip fracture.  He lives alone and ambulates with a walker at baseline.  Total hip arthroplasty with Dr. Jerri on 10/12.

## 2024-07-20 NOTE — H&P (Signed)
 PREOPERATIVE H&P  Chief Complaint: LEFT TOTAL HIP ARTHROPLASTY  HPI: Gabriel Hanson is a 73 y.o. male who presents for surgical treatment of LEFT TOTAL HIP ARTHROPLASTY.  He denies any changes in medical history.  Past Surgical History:  Procedure Laterality Date  . KNEE ARTHROSCOPY     right   . TONSILLECTOMY     Social History   Socioeconomic History  . Marital status: Single    Spouse name: Not on file  . Number of children: 0  . Years of education: Not on file  . Highest education level: Bachelor's degree (e.g., BA, AB, BS)  Occupational History  . Occupation: Energy manager: [employer]    Comment: Fulltime  Tobacco Use  . Smoking status: Heavy Smoker    Current packs/day: 1.00    Average packs/day: 1 pack/day for 44.0 years (44.0 ttl pk-yrs)    Types: Cigarettes  . Smokeless tobacco: Never  Vaping Use  . Vaping status: Every Day  Substance and Sexual Activity  . Alcohol use: Yes    Alcohol/week: 0.0 standard drinks of alcohol    Comment: Occasionally/fim  . Drug use: Yes    Types: Marijuana  . Sexual activity: Not on file  Other Topics Concern  . Not on file  Social History Narrative  . Not on file   Social Drivers of Health   Financial Resource Strain: Low Risk  (09/28/2017)   Overall Financial Resource Strain (CARDIA)   . Difficulty of Paying Living Expenses: Not very hard  Food Insecurity: No Food Insecurity (07/19/2024)   Hunger Vital Sign   . Worried About Programme researcher, broadcasting/film/video in the Last Year: Never true   . Ran Out of Food in the Last Year: Never true  Transportation Needs: No Transportation Needs (07/19/2024)   PRAPARE - Transportation   . Lack of Transportation (Medical): No   . Lack of Transportation (Non-Medical): No  Physical Activity: Inactive (09/28/2017)   Exercise Vital Sign   . Days of Exercise per Week: 0 days   . Minutes of Exercise per Session: 0 min  Stress: Stress Concern Present (09/28/2017)   [employer]ational [employer] - Occupational Stress Questionnaire   . Feeling of Stress : To some extent  Social Connections: Socially Isolated (07/19/2024)   Social Connection and Isolation Panel   . Frequency of Communication with Friends and Family: Never   . Frequency of Social Gatherings with Friends and Family: Never   . Attends Religious Services: Never   . Active Member of Clubs or Organizations: No   . Attends Banker Meetings: Never   . Marital Status: Never married   Family History  Problem Relation Age of Onset  . Heart attack Mother   . COPD Father   . Hypotension Sister   . Heart disease Other    No Known Allergies Prior to Admission medications   Medication Sig Start Date End Date Taking? Authorizing Provider  amphetamine -dextroamphetamine  (ADDERALL) 20 MG tablet Take 1 tablet (20 mg total) by mouth 2 (two) times daily. Patient taking differently: Take 20 mg by mouth in the morning. 07/10/24  Yes Hanson, Gabriel LABOR, MD  [facility] SENNA 8.6 MG tablet Take 2 tablets by mouth as needed for constipation. 02/15/22  Yes [provider]  Diroximel Fumarate  (VUMERITY ) 231 MG CPDR Take 1 capsule by mouth 2 (two) times daily. 10/31/23  Yes Hanson, Gabriel LABOR, MD  gabapentin  (NEURONTIN ) 300 MG capsule One  po qAm and two po qHS Patient taking differently: Take 300-600 mg by mouth as needed. 07/09/23  Yes Hanson, Gabriel LABOR, MD  naproxen sodium (ALEVE) 220 MG tablet Take 660 mg by mouth daily.   Yes [provider]  silodosin  (RAPAFLO ) 8 MG CAPS capsule Take 1 capsule (8 mg total) by mouth daily with breakfast. 01/16/24  Yes Hanson, Gabriel LABOR, MD  TRAMADOL HCL PO Take 1 tablet by mouth as needed.   Yes [provider]     Positive ROS: All other systems have been reviewed and were otherwise negative with the exception of those mentioned in the HPI and as above.  Physical Exam: General: Alert, no acute distress Cardiovascular: No pedal edema Respiratory:  No cyanosis, no use of accessory musculature GI: abdomen soft Skin: No lesions in the area of chief complaint Neurologic: Sensation intact distally Psychiatric: Patient is competent for consent with normal mood and affect Lymphatic: no lymphedema  MUSCULOSKELETAL: exam stable  Assessment: LEFT TOTAL HIP ARTHROPLASTY  Plan: Plan for Procedure(s): ARTHROPLASTY, HIP, TOTAL, ANTERIOR APPROACH  The risks benefits and alternatives were discussed with the patient including but not limited to the risks of nonoperative treatment, versus surgical intervention including infection, bleeding, nerve injury,  blood clots, cardiopulmonary complications, morbidity, mortality, among others, and they were willing to proceed.   Gabriel Cummins, MD 07/20/2024 6:26 AM

## 2024-07-20 NOTE — Anesthesia Procedure Notes (Signed)
 Spinal  Patient location during procedure: OR Start time: 07/20/2024 7:42 AM End time: 07/20/2024 7:47 AM Reason for block: surgical anesthesia Staffing Performed: resident/CRNA  Resident/CRNA: [place], Delphin Funes C, CRNA Performed by: [place], Jaxston Chohan C, CRNA Authorized by: Erma Thom SAUNDERS, MD   Preanesthetic Checklist Completed: patient identified, IV checked, site marked, risks and benefits discussed, surgical consent, monitors and equipment checked, pre-op evaluation and timeout performed Spinal Block Patient position: left lateral decubitus Prep: DuraPrep and site prepped and draped Patient monitoring: heart rate, cardiac monitor, continuous pulse ox and blood pressure Approach: midline Location: L3-4 Injection technique: single-shot Needle Needle type: Pencan  Needle gauge: 24 G Needle length: 9 cm Assessment Sensory level: T4 Events: CSF return Additional Notes IV functioning, monitors applied to pt. Expiration date of kit checked and confirmed to be in date. Sterile prep and drape, hand hygiene and sterile gloved used. Pt was positioned and spine was prepped in sterile fashion. Skin was anesthetized with lidocaine. Free flow of clear CSF obtained prior to injecting local anesthetic into CSF x 1 attempt. Spinal needle aspirated freely following injection. Needle was carefully withdrawn, and pt tolerated procedure well. Loss of motor and sensory on exam post injection.

## 2024-07-20 NOTE — Anesthesia Preprocedure Evaluation (Addendum)
 Anesthesia Evaluation  Patient identified by MRN, date of birth, ID band Patient awake    Reviewed: Allergy & Precautions, H&P , NPO status , Patient's Chart, lab work & pertinent test results  Airway Mallampati: II  TM Distance: >3 FB Neck ROM: Full    Dental  (+) Chipped, Loose   Pulmonary Current Smoker   Pulmonary exam normal breath sounds clear to auscultation       Cardiovascular (-) angina (-) Past MI and (-) Cardiac Stents negative cardio ROS Normal cardiovascular exam Rhythm:Regular Rate:Normal     Neuro/Psych  PSYCHIATRIC DISORDERS  Depression   Dementia MS    GI/Hepatic negative GI ROS, Neg liver ROS,neg GERD  ,,  Endo/Other  negative endocrine ROS    Renal/GU negative Renal ROS  negative genitourinary   Musculoskeletal Left sided hip fracture   Abdominal   Peds negative pediatric ROS (+)  Hematology negative hematology ROS (+)   Anesthesia Other Findings   Reproductive/Obstetrics negative OB ROS                              Anesthesia Physical Anesthesia Plan  ASA: 3  Anesthesia Plan: Spinal   Post-op Pain Management: Ofirmev  IV (intra-op)*   Induction: Intravenous  PONV Risk Score and Plan: 1 and Ondansetron, Dexamethasone and Treatment may vary due to age or medical condition  Airway Management Planned: Simple Face Mask and Natural Airway  Additional Equipment: None  Intra-op Plan:   Post-operative Plan: Extubation in OR  Informed Consent: I have reviewed the patients History and Physical, chart, labs and discussed the procedure including the risks, benefits and alternatives for the proposed anesthesia with the patient or authorized representative who has indicated his/her understanding and acceptance.     Dental advisory given  Plan Discussed with: CRNA  Anesthesia Plan Comments:          Anesthesia Quick Evaluation

## 2024-07-20 NOTE — Consult Note (Signed)
 ORTHOPAEDIC CONSULTATION  REQUESTING PHYSICIAN: Barbarann Nest, MD  Chief Complaint: Left femoral neck fracture  HPI: Gabriel Hanson is a 73 y.o. male with medical history significant for MS and cognitive dysfunction admitted to the hospital with left hip fracture after mechanical fall at home yesterday.  Patient lives on his own, his sister is his healthcare power of attorney and takes close care of him.  He usually ambulates with a walker, yesterday afternoon he texted his sister when she went over to visit him, she found him on the ground.  He had some left hip discomfort, but was able to bear weight.  However this morning when she went to check on him again he was still in bed and having more severe pain, said that he was unable to sleep through most of the night.  He denies any recent fevers, chills, nausea, vomiting, he thinks he did hit the left side of his face when he fell yesterday  Ortho consulted for surgical evaluation.  Past Medical History:  Diagnosis Date   Depressive disorder, not elsewhere classified    Disorder of eye, unspecified    Memory loss    Multiple sclerosis    Other persistent mental disorders due to conditions classified elsewhere    Other psoriasis    Psychosexual dysfunction, unspecified    Past Surgical History:  Procedure Laterality Date   KNEE ARTHROSCOPY     right    TONSILLECTOMY     Social History   Socioeconomic History   Marital status: Single    Spouse name: Not on file   Number of children: 0   Years of education: Not on file   Highest education level: Bachelor's degree (e.g., BA, AB, BS)  Occupational History   Occupation: Energy manager: [employer]    Comment: Fulltime  Tobacco Use   Smoking status: Heavy Smoker    Current packs/day: 1.00    Average packs/day: 1 pack/day for 44.0 years (44.0 ttl pk-yrs)    Types: Cigarettes   Smokeless tobacco: Never  Vaping Use   Vaping status: Every Day  Substance and  Sexual Activity   Alcohol use: Yes    Alcohol/week: 0.0 standard drinks of alcohol    Comment: Occasionally/fim   Drug use: Yes    Types: Marijuana   Sexual activity: Not on file  Other Topics Concern   Not on file  Social History Narrative   Not on file   Social Drivers of Health   Financial Resource Strain: Low Risk  (09/28/2017)   Overall Financial Resource Strain (CARDIA)    Difficulty of Paying Living Expenses: Not very hard  Food Insecurity: No Food Insecurity (07/19/2024)   Hunger Vital Sign    Worried About Running Out of Food in the Last Year: Never true    Ran Out of Food in the Last Year: Never true  Transportation Needs: No Transportation Needs (07/19/2024)   PRAPARE - Administrator, Civil Service (Medical): No    Lack of Transportation (Non-Medical): No  Physical Activity: Inactive (09/28/2017)   Exercise Vital Sign    Days of Exercise per Week: 0 days    Minutes of Exercise per Session: 0 min  Stress: Stress Concern Present (09/28/2017)   [employer] of Occupational Health - Occupational Stress Questionnaire    Feeling of Stress : To some extent  Social Connections: Socially Isolated (07/19/2024)   Social Connection and Isolation Panel    Frequency of Communication  with Friends and Family: Never    Frequency of Social Gatherings with Friends and Family: Never    Attends Religious Services: Never    Database administrator or Organizations: No    Attends Engineer, structural: Never    Marital Status: Never married   Family History  Problem Relation Age of Onset   Heart attack Mother    COPD Father    Hypotension Sister    Heart disease Other    No Known Allergies Prior to Admission medications   Medication Sig Start Date End Date Taking? Authorizing Provider  amphetamine -dextroamphetamine  (ADDERALL) 20 MG tablet Take 1 tablet (20 mg total) by mouth 2 (two) times daily. Patient taking differently: Take 20 mg by mouth in the  morning. 07/10/24  Yes Sater, Charlie LABOR, MD  [facility] SENNA 8.6 MG tablet Take 2 tablets by mouth as needed for constipation. 02/15/22  Yes [provider]  Diroximel Fumarate  (VUMERITY ) 231 MG CPDR Take 1 capsule by mouth 2 (two) times daily. 10/31/23  Yes Sater, Charlie LABOR, MD  gabapentin  (NEURONTIN ) 300 MG capsule One po qAm and two po qHS Patient taking differently: Take 300-600 mg by mouth as needed. 07/09/23  Yes Sater, Charlie LABOR, MD  naproxen sodium (ALEVE) 220 MG tablet Take 660 mg by mouth daily.   Yes [provider]  silodosin  (RAPAFLO ) 8 MG CAPS capsule Take 1 capsule (8 mg total) by mouth daily with breakfast. 01/16/24  Yes Sater, Charlie LABOR, MD  TRAMADOL HCL PO Take 1 tablet by mouth as needed.   Yes [provider]   DG Knee 1-2 Views Left Result Date: 07/19/2024 CLINICAL DATA:  Status post fall. EXAM: LEFT KNEE - 1-2 VIEW COMPARISON:  None Available. FINDINGS: No evidence of an acute fracture or dislocation. There is marked severity tricompartmental joint space narrowing. A small suprapatellar effusion is suspected. IMPRESSION: 1. Marked severity tricompartmental degenerative changes. 2. Small suprapatellar effusion. Electronically Signed   By: Suzen Dials M.D.   On: 07/19/2024 12:56   DG Lumbar Spine 2-3 Views Result Date: 07/19/2024 CLINICAL DATA:  Status post fall EXAM: LUMBAR SPINE - 2 VIEW COMPARISON:  MRI lumbar spine dated 01/11/2024 FINDINGS: Minimal anterior wedging of T12 and L1 vertebral bodies. Alignment is normal. Intervertebral disc spaces are maintained. Partially imaged large volume stool throughout the colon. IMPRESSION: 1. Minimal anterior wedging of T12 and L1 vertebral bodies, age indeterminate. 2. Partially imaged large volume stool throughout the colon. Electronically Signed   By: Limin  Elfa Wooton M.D.   On: 07/19/2024 12:55   DG Hip Unilat With Pelvis 2-3 Views Left Result Date: 07/19/2024 CLINICAL DATA:  Status post fall EXAM: DG HIP (WITH OR  WITHOUT PELVIS) 3V LEFT COMPARISON:  None Available. FINDINGS: Subcapital left femoral neck fracture with severe varus angulation. No acute dislocation of the hip. IMPRESSION: Angulated subcapital left femoral neck fracture. Electronically Signed   By: Limin  Zoha Spranger M.D.   On: 07/19/2024 12:53   CT Cervical Spine Wo Contrast Result Date: 07/19/2024 EXAM: CT CERVICAL SPINE WITHOUT CONTRAST 07/19/2024 12:14:16 PM TECHNIQUE: CT of the cervical spine was performed without the administration of intravenous contrast. Multiplanar reformatted images are provided for review. Automated exposure control, iterative reconstruction, and/or weight based adjustment of the mA/kV was utilized to reduce the radiation dose to as low as reasonably achievable. COMPARISON: MRI of the cervical spine 01/11/2024. CLINICAL HISTORY: Neck trauma (Age >= 65y). FINDINGS: CERVICAL SPINE: BONES AND ALIGNMENT: No acute fracture or traumatic malalignment.  DEGENERATIVE CHANGES: Endplate degenerative change and facet hypertrophy are present at C4-C5. Ankylosis is present across the disc space at C4-C5, right greater than left. Right greater than left foraminal narrowing is present at C5-C6 and C6-C7. SOFT TISSUES: No prevertebral soft tissue swelling. IMPRESSION: 1. No acute abnormality of the cervical spine related to the reported neck trauma. 2. Inplate degenerative change and facet hypertrophy at C4-5 with ankylosis across the disc space, right greater than left. 3. Foraminal narrowing at C5-6 and C6-7. Electronically signed by: Lonni Necessary MD 07/19/2024 12:22 PM EDT RP Workstation: [id number]   CT Head Wo Contrast Result Date: 07/19/2024 EXAM: CT HEAD WITHOUT CONTRAST 07/19/2024 12:14:16 PM TECHNIQUE: CT of the head was performed without the administration of intravenous contrast. Automated exposure control, iterative reconstruction, and/or weight based adjustment of the mA/kV was utilized to reduce the radiation dose to as low as  reasonably achievable. COMPARISON: None available. CLINICAL HISTORY: Head trauma, minor (Age >= 65y). Pt reports while walking this morning his left leg gave out and he fell to the ground. Pt reports hitting the left side of his face. Reports left leg pain at this time. Denies LOC. FINDINGS: BRAIN AND VENTRICLES: Paraventricular white matter changes are mildly advanced for age. This most likely reflects the sequelae of chronic microvascular ischemia. No acute hemorrhage. No evidence of acute infarct. No hydrocephalus. No extra-axial collection. No mass effect or midline shift. ORBITS: No acute abnormality. SINUSES: No acute abnormality. SOFT TISSUES AND SKULL: No acute soft tissue abnormality. No skull fracture. IMPRESSION: 1. No acute intracranial abnormality. 2. Stable Mildly advanced paraventricular white matter changes for age, likely sequelae of chronic microvascular ischemia. Electronically signed by: Lonni Necessary MD 07/19/2024 12:20 PM EDT RP Workstation: [id number]    All pertinent xrays, MRI, CT independently reviewed and interpreted  Positive ROS: All other systems have been reviewed and were otherwise negative with the exception of those mentioned in the HPI and as above.  Physical Exam: General: No acute distress Cardiovascular: No pedal edema Respiratory: No cyanosis, no use of accessory musculature GI: No organomegaly, abdomen is soft and non-tender Skin: No lesions in the area of chief complaint Neurologic: Sensation intact distally Psychiatric: Patient is at baseline mood and affect Lymphatic: No axillary or cervical lymphadenopathy  MUSCULOSKELETAL:  - severe pain with movement of the hip and extremity - skin intact - NVI distally - compartments soft  Assessment: Left femoral neck fracture  Plan: - surgical treatment is recommended for pain relief, quality of life and early mobilization - patient and family are aware of r/b/a and wish to proceed, informed consent  obtained - medical optimization per primary team - THA planned for today  Thank you for the consult and the opportunity to see Mr. Lang BROCKS [name], MD [facility] - [place] 7:28 AM

## 2024-07-20 NOTE — Plan of Care (Signed)
  Problem: Education: Goal: Knowledge of General Education information will improve Description Including pain rating scale, medication(s)/side effects and non-pharmacologic comfort measures Outcome: Progressing   Problem: Clinical Measurements: Goal: Ability to maintain clinical measurements within normal limits will improve Outcome: Progressing Goal: Will remain free from infection Outcome: Progressing   Problem: Nutrition: Goal: Adequate nutrition will be maintained Outcome: Progressing   Problem: Safety: Goal: Ability to remain free from injury will improve Outcome: Progressing   

## 2024-07-21 ENCOUNTER — Encounter (HOSPITAL_COMMUNITY): Payer: Self-pay | Admitting: Orthopaedic Surgery

## 2024-07-21 DIAGNOSIS — S72012A Unspecified intracapsular fracture of left femur, initial encounter for closed fracture: Secondary | ICD-10-CM | POA: Diagnosis not present

## 2024-07-21 LAB — BASIC METABOLIC PANEL WITH GFR
Anion gap: 8 (ref 5–15)
BUN: 15 mg/dL (ref 8–23)
CO2: 26 mmol/L (ref 22–32)
Calcium: 8.8 mg/dL — ABNORMAL LOW (ref 8.9–10.3)
Chloride: 100 mmol/L (ref 98–111)
Creatinine, Ser: 0.75 mg/dL (ref 0.61–1.24)
GFR, Estimated: >60 mL/min (ref >60)
Glucose, Bld: 111 mg/dL — ABNORMAL HIGH (ref 70–99)
Potassium: 4.2 mmol/L (ref 3.5–5.1)
Sodium: 134 mmol/L — ABNORMAL LOW (ref 135–145)

## 2024-07-21 LAB — CBC
HCT: 35 % — ABNORMAL LOW (ref 39.0–52.0)
Hemoglobin: 11.4 g/dL — ABNORMAL LOW (ref 13.0–17.0)
MCH: 30.3 pg (ref 26.0–34.0)
MCHC: 32.6 g/dL (ref 30.0–36.0)
MCV: 93.1 fL (ref 80.0–100.0)
Platelets: 178 K/uL (ref 150–400)
RBC: 3.76 MIL/uL — ABNORMAL LOW (ref 4.22–5.81)
RDW: 13.2 % (ref 11.5–15.5)
WBC: 11 K/uL — ABNORMAL HIGH (ref 4.0–10.5)
nRBC: 0 % (ref 0.0–0.2)

## 2024-07-21 MED ORDER — POLYETHYLENE GLYCOL 3350 17 G PO PACK
17.0000 g | PACK | Freq: Every day | ORAL | Status: DC
  Administered 2024-07-21 – 2024-07-23 (×3): 17 g via ORAL
  Filled 2024-07-21 (×3): qty 1

## 2024-07-21 NOTE — NC FL2 (Signed)
 [facility]  MEDICAID FL2 LEVEL OF CARE FORM     IDENTIFICATION  Patient Name: Gabriel Hanson Birthdate: Oct 16, 1950 Sex: male Admission Date (Current Location): 07/19/2024  [facility] and [place] Number:  Producer, television/film/video and Address:  [facility],  [address] [place], [place] [address]      Provider Number: [id number]  Attending Physician Name and Address:  Barbarann Nest, MD  Relative Name and Phone Number:  Nest Jacobson (sister) [phone]    Current Level of Care: Hospital Recommended Level of Care: Skilled Nursing Facility Prior Approval Number:    Date Approved/Denied:   PASRR Number: [id number]  Discharge Plan: SNF    Current Diagnoses: Patient Active Problem List   Diagnosis Date Noted   Closed subcapital fracture of neck of femur, left, initial encounter (HCC) 07/19/2024   Altered mental status 01/19/2022   Leukocytosis 01/19/2022   Hypoalbuminemia due to protein-calorie malnutrition 01/19/2022   Hyperglycemia 01/19/2022   Dehydration 01/19/2022   Sepsis (HCC) 01/19/2022   CAP (community acquired pneumonia) 01/18/2022   Attention deficit 12/17/2017   Numbness 05/17/2015   Gait disturbance 05/17/2015   High risk medication use 01/15/2015   Urinary hesitancy 01/15/2015   Cognitive dysfunction 03/18/2010   Erectile dysfunction 03/18/2010   Depression 03/18/2010   Multiple sclerosis 03/18/2010   Disorder of eye 03/18/2010   Constipation 03/18/2010   RECTAL BLEEDING 03/18/2010   PSORIASIS 03/18/2010   WEIGHT LOSS 03/18/2010    Orientation RESPIRATION BLADDER Height & Weight     Self, Place (Pleasantly confused/poor memory)  Normal Incontinent, External catheter Weight: 178 lb 9.2 oz (81 kg) Height:  6' 1 (185.4 cm)  BEHAVIORAL SYMPTOMS/MOOD NEUROLOGICAL BOWEL NUTRITION STATUS      Continent Diet (Regular)  AMBULATORY STATUS COMMUNICATION OF NEEDS Skin   Limited Assist Verbally Surgical wounds (Wound 07/20/24 0917 Surgical Closed  Surgical Incision Hip Left)                       Personal Care Assistance Level of Assistance  Bathing, Feeding, Dressing Bathing Assistance: Limited assistance Feeding assistance: Independent Dressing Assistance: Limited assistance     Functional Limitations Info  Sight, Hearing, Speech Sight Info: Impaired (eyeglasses) Hearing Info: Adequate Speech Info: Adequate    SPECIAL CARE FACTORS FREQUENCY  PT (By licensed PT), OT (By licensed OT)     PT Frequency: 5x per week OT Frequency: 5x per week            Contractures Contractures Info: Not present    Additional Factors Info  Code Status, Allergies Code Status Info: DNR Allergies Info: NKA           Current Medications (07/21/2024):  This is the current hospital active medication list Current Facility-Administered Medications  Medication Dose Route Frequency Provider Last Rate Last Admin   0.9 %  sodium chloride  infusion   Intravenous Continuous Jerri Kay HERO, MD 75 mL/hr at 07/20/24 2022 New Bag at 07/20/24 2022   acetaminophen  (TYLENOL ) tablet 325-650 mg  325-650 mg Oral Q6H PRN Jerri Kay HERO, MD       albuterol (PROVENTIL) (2.5 MG/3ML) 0.083% nebulizer solution 2.5 mg  2.5 mg Nebulization Q2H PRN Jerri Kay HERO, MD       alum & mag hydroxide-simeth (MAALOX/MYLANTA) 200-200-20 MG/5ML suspension 30 mL  30 mL Oral Q4H PRN Jerri Kay HERO, MD       Diroximel Fumarate  CPDR 1 capsule  1 capsule Oral BID Jerri Kay HERO, MD  1 capsule at 07/21/24 1414   docusate sodium (COLACE) capsule 100 mg  100 mg Oral BID Jerri Kay HERO, MD   100 mg at 07/21/24 9142   enoxaparin  (LOVENOX ) injection 40 mg  40 mg Subcutaneous Q24H Jerri Kay HERO, MD   40 mg at 07/21/24 0857   HYDROcodone-acetaminophen  (NORCO) 7.5-325 MG per tablet 1-2 tablet  1-2 tablet Oral Daily PRN Jerri Kay HERO, MD       HYDROcodone-acetaminophen  (NORCO/VICODIN) 5-325 MG per tablet 1-2 tablet  1-2 tablet Oral BID PRN Jerri Kay HERO, MD   1 tablet at 07/20/24 2003    magnesium citrate solution 1 Bottle  1 Bottle Oral Once PRN Jerri Kay HERO, MD       menthol (CEPACOL) lozenge 3 mg  1 lozenge Oral PRN Jerri Kay HERO, MD       Or   phenol (CHLORASEPTIC) mouth spray 1 spray  1 spray Mouth/Throat PRN Jerri Kay HERO, MD       methocarbamol (ROBAXIN) tablet 500 mg  500 mg Oral Q8H PRN Jerri Kay HERO, MD   500 mg at 07/20/24 2003   Or   methocarbamol (ROBAXIN) injection 500 mg  500 mg Intravenous Q8H PRN Jerri Kay HERO, MD       morphine (PF) 2 MG/ML injection 0.5-1 mg  0.5-1 mg Intravenous Q2H PRN Jerri Kay HERO, MD       naproxen (NAPROSYN) tablet 500 mg  500 mg Oral Daily Jerri Kay HERO, MD   500 mg at 07/21/24 0857   ondansetron (ZOFRAN) tablet 4 mg  4 mg Oral Q6H PRN Jerri Kay HERO, MD       Or   ondansetron (ZOFRAN) injection 4 mg  4 mg Intravenous Q6H PRN Jerri Kay HERO, MD       polyethylene glycol (MIRALAX / GLYCOLAX) packet 17 g  17 g Oral Daily Barbarann Nest, MD       sorbitol 70 % solution 30 mL  30 mL Oral Daily PRN Jerri Kay HERO, MD       tamsulosin  (FLOMAX ) capsule 0.4 mg  0.4 mg Oral QPC breakfast Jerri Kay HERO, MD   0.4 mg at 07/21/24 0857   traZODone (DESYREL) tablet 25 mg  25 mg Oral QHS PRN Jerri Kay HERO, MD         Discharge Medications: Please see discharge summary for a list of discharge medications.  Relevant Imaging Results:  Relevant Lab Results:   Additional Information SSN: [id number]  Heather DELENA Saltness, LCSW

## 2024-07-21 NOTE — Plan of Care (Signed)
  Problem: Education: Goal: Knowledge of General Education information will improve Description: Including pain rating scale, medication(s)/side effects and non-pharmacologic comfort measures Outcome: Progressing   Problem: Health Behavior/Discharge Planning: Goal: Ability to manage health-related needs will improve Outcome: Progressing   Problem: Clinical Measurements: Goal: Ability to maintain clinical measurements within normal limits will improve Outcome: Progressing Goal: Will remain free from infection Outcome: Progressing Goal: Diagnostic test results will improve Outcome: Progressing Goal: Respiratory complications will improve Outcome: Adequate for Discharge Goal: Cardiovascular complication will be avoided Outcome: Progressing   Problem: Activity: Goal: Risk for activity intolerance will decrease Outcome: Progressing   Problem: Nutrition: Goal: Adequate nutrition will be maintained Outcome: Completed/Met   Problem: Coping: Goal: Level of anxiety will decrease Outcome: Progressing   Problem: Elimination: Goal: Will not experience complications related to bowel motility Outcome: Progressing Goal: Will not experience complications related to urinary retention Outcome: Completed/Met   Problem: Pain Managment: Goal: General experience of comfort will improve and/or be controlled Outcome: Progressing   Problem: Safety: Goal: Ability to remain free from injury will improve Outcome: Progressing   Problem: Skin Integrity: Goal: Risk for impaired skin integrity will decrease Outcome: Adequate for Discharge   Problem: Education: Goal: Individualized Educational Video(s) Outcome: Completed/Met   Problem: Activity: Goal: Ability to ambulate and perform ADLs will improve Outcome: Progressing   Problem: Clinical Measurements: Goal: Postoperative complications will be avoided or minimized Outcome: Progressing   Problem: Self-Concept: Goal: Ability to maintain  and perform role responsibilities to the fullest extent possible will improve Outcome: Progressing   Problem: Pain Management: Goal: Pain level will decrease Outcome: Adequate for Discharge

## 2024-07-21 NOTE — Progress Notes (Addendum)
 Progress Note   Patient: Gabriel Hanson FMW:[id number] DOB: [birth year] DOA: 07/19/2024     2 DOS: the patient was seen and examined on 07/21/2024   Brief hospital course: 73yo with h/o MS with cognitive dysfunction who presented with a mechanical fall and was found to have a L hip fracture.  He lives alone and ambulates with a walker at baseline.  Total hip arthroplasty with Dr. Jerri on 10/12.  Assessment and Plan:  Left femoral neck fracture Unwitnessed mechanical fall at home  Head CT negative and no other injuries noted Admitted to med surg Orthopedics performed total hip arthroplasty on 10/12 Doing well, ortho has signed off PT/OT consulting, recommend STR He is medically stable when able to be transferred to a facility He would be delighted to go back to [facility]   Anterior lumbar wedge fractures Not particularly symptomatic Sister states that he has some known spinal abnormality in the lumbar region that his neurologist has mentioned in the past, so suspect this is chronic Discussed with neurosurgery, who does not recommend any further treatment or evaluation   MS with ambulatory dysfunction, cognitive impairment Continue Diroximel twice daily Ambulated short distances with a walker at home prior to fall   BPH Continue silodosin  (tamsulosin  per formulary)   ADHD Hold Adderall - he will not be performing tasks that require attention and focus as an inpatient   DNR I have discussed code status with the family and the patient would not desire resuscitation and would prefer to die a natural death should that situation arise. Patient will need a gold out of facility DNR form at the time of discharge         Consultants: Orthopedics   Procedures: Total hip arthroplasty 10/12   Antibiotics: Cefazolin x 3 doses  30 Day Unplanned Readmission Risk Score    Flowsheet Row ED to Hosp-Admission (Current) from 07/19/2024 in [facility]  30 Day  Unplanned Readmission Risk Score (%) 6.63 Filed at 07/21/2024 0400    This score is the patient's risk of an unplanned readmission within 30 days of being discharged (0 -100%). The score is based on dignosis, age, lab data, medications, orders, and past utilization.   Low:  0-14.9   Medium: 15-21.9   High: 22-29.9   Extreme: 30 and above           Subjective: Pleasant, not having pain, no complaints.  I spoke with his sister.  She checks on him twice a week, likely needs more support at home.  Thinks he will need LTC after STR placement.   Objective: Vitals:   07/21/24 0917 07/21/24 1333  BP: 124/76 138/69  Pulse: 80 81  Resp: 16 15  Temp: 98.2 F (36.8 C) 97.7 F (36.5 C)  SpO2: 97% 96%    Intake/Output Summary (Last 24 hours) at 07/21/2024 1500 Last data filed at 07/21/2024 1333 Gross per 24 hour  Intake 2251.01 ml  Output 2300 ml  Net -48.99 ml   Filed Weights   07/19/24 2140  Weight: 81 kg    Exam:  General:  Appears calm and comfortable and is in NAD Eyes:  normal lids, iris; poor eye contact ENT:  grossly normal hearing, lips & tongue, mmm Cardiovascular:  RRR. No LE edema.  Respiratory:   CTA bilaterally with no wheezes/rales/rhonchi.  Normal respiratory effort. Abdomen:  soft, NT, ND Skin:  no rash or induration seen on limited exam Musculoskeletal:  L hip bandage is C/D/I, no gross deformity  appreciated Psychiatric: blunted mood and affect, speech fluent and appropriate, AOx3 Neurologic:  CN 2-12 grossly intact, moves all extremities in coordinated fashion other than  LLE  Data Reviewed: I have reviewed the patient's lab results since admission.  Pertinent labs for today include:  Glucose 111 WBC 11.9 Hgb 11.4, down from 13.2   Family Communication: None present; I spoke with his sister by telephone   Mobility: PT/OT Consulted and are recommending - Skilled Nursing-Short Term Rehab (<3 Hours/Day)07/21/2024 1422    Code Status: Limited: Do  not attempt resuscitation (DNR) -DNR-LIMITED -Do Not Intubate/DNI     Disposition: Status is: Inpatient Remains inpatient appropriate because: needs placement     Time spent: 50 minutes  Unresulted Labs (From admission, onward)     Start     Ordered   07/21/24 0500  CBC  Daily,   R     Comments: For 3 days.    07/20/24 1116   07/21/24 0500  Basic metabolic panel  Daily,   R     Comments: For 2 days .    07/20/24 1116   07/19/24 1636  Urine Culture  Once,   R        07/19/24 1636             Author: Delon Herald, MD 07/21/2024 3:00 PM  For on call review [email or web address].

## 2024-07-21 NOTE — TOC Progression Note (Addendum)
 Transition of Care [facility]) - Progression Note    Patient Details  Name: Gabriel Hanson MRN: [id number] Date of Birth: 26-Apr-1951  [facility]) CM/SW Contact  Heather DELENA Saltness, LCSW Phone Number: 07/21/2024, 2:43 PM  Clinical Narrative:    Pt pleasantly confused at baseline. CSW spoke with pt's sister, Delon Jacobson [phone], via phone call to discuss discharge planning and PT's recommendation for SNF rehab. Pt's sister reports pt stayed at [facility] for rehab previously and would prefer to go there again. CSW completed FL-2 and sent referrals to [facility] and other SNF locations in [place]. TOC will continue to follow.   Expected Discharge Plan and Services  SNF     Social Drivers of Health (SDOH) Interventions SDOH Screenings   Food Insecurity: No Food Insecurity (07/19/2024)  Housing: Low Risk  (07/19/2024)  Transportation Needs: No Transportation Needs (07/19/2024)  Utilities: Not At Risk (07/19/2024)  Financial Resource Strain: Low Risk  (09/28/2017)  Physical Activity: Inactive (09/28/2017)  Social Connections: Socially Isolated (07/19/2024)  Stress: Stress Concern Present (09/28/2017)  Tobacco Use: High Risk (07/20/2024)    Readmission Risk Interventions    07/21/2024    2:42 PM  Readmission Risk Prevention Plan  Post Dischage Appt Not Complete  Appt Comments No PCP listed, but has traditional Medicare  Medication Screening Complete  Transportation Screening Complete    Signed: Heather Saltness, MSW, LCSW Clinical Social Worker Inpatient Care Management 07/21/2024 2:43 PM

## 2024-07-21 NOTE — Progress Notes (Signed)
 Subjective: 1 Day Post-Op Procedure(s) (LRB): ARTHROPLASTY, HIP, TOTAL, ANTERIOR APPROACH (Left) Patient reports pain as mild.  Resting comfortably in bed.  Objective: Vital signs in last 24 hours: Temp:  [97.1 F (36.2 C)-98.2 F (36.8 C)] 98.2 F (36.8 C) (10/13 0604) Pulse Rate:  [69-96] 90 (10/13 0604) Resp:  [14-20] 20 (10/13 0604) BP: (116-143)/(52-85) 132/72 (10/13 0604) SpO2:  [90 %-99 %] 90 % (10/13 0604)  Intake/Output from previous day: 10/12 0701 - 10/13 0700 In: 3641 [P.O.:480; I.V.:2611; IV Piggyback:550] Out: 2000 [Urine:1750; Blood:250] Intake/Output this shift: Total I/O In: 1531 [P.O.:120; I.V.:1111; IV Piggyback:300] Out: 900 [Urine:900]  Recent Labs    07/19/24 1302 07/20/24 1236  HGB 14.7 13.2   Recent Labs    07/19/24 1302 07/20/24 1236  WBC 9.2 11.9*  RBC 4.87 4.33  HCT 44.8 40.8  PLT 222 179   Recent Labs    07/19/24 1302 07/20/24 1236  NA 136  --   K 5.0  --   CL 100  --   CO2 29  --   BUN 14  --   CREATININE 0.86 0.68  GLUCOSE 115*  --   CALCIUM 9.6  --    No results for input(s): LABPT, INR in the last 72 hours.  Neurologically intact Neurovascular intact Sensation intact distally Intact pulses distally Dorsiflexion/Plantar flexion intact Incision: dressing C/D/I No cellulitis present Compartment soft   Assessment/Plan: 1 Day Post-Op Procedure(s) (LRB): ARTHROPLASTY, HIP, TOTAL, ANTERIOR APPROACH (Left) Up with therapy WBAT LLE Dvt ppx- lovenox /scds.  Rx on chart Post-op pain control- oxy- rx on chart D/c dispo per primary team F/u with ortho two weeks post-op      Ronal LITTIE Grave 07/21/2024, 6:55 AM

## 2024-07-21 NOTE — Plan of Care (Signed)
  Problem: Coping: Goal: Level of anxiety will decrease Outcome: Progressing   Problem: Activity: Goal: Risk for activity intolerance will decrease Outcome: Progressing   Problem: Pain Managment: Goal: General experience of comfort will improve and/or be controlled Outcome: Progressing   Problem: Safety: Goal: Ability to remain free from injury will improve Outcome: Progressing

## 2024-07-21 NOTE — Evaluation (Signed)
 Physical Therapy Evaluation Patient Details Name: Gabriel Hanson MRN: [id number] DOB: 1951-05-08 Today's Date: 07/21/2024  History of Present Illness  73 y.o. male admitted to the hospital with left hip fracture after mechanical fall at home yesterday, found to have a L hip fracture. ortho consulted. pt is no ws/p L THA, AA  PMH: MS and cognitive dysfunction  Clinical Impression  Pt admitted with above diagnosis.  Pt mod I at baseline. Sister present for PT eval Pt amb 77' with RW, min assist and multi-modal cues for sequence and safety. Pt reports fatigue with amb, no incr pain with activity Patient will benefit from continued inpatient follow up therapy, <3 hours/day   Pt currently with functional limitations due to the deficits listed below (see PT Problem List). Pt will benefit from acute skilled PT to increase their independence and safety with mobility to allow discharge.           If plan is discharge home, recommend the following: A little help with walking and/or transfers;A little help with bathing/dressing/bathroom;Help with stairs or ramp for entrance;Assist for transportation   Can travel by private vehicle   Yes    Equipment Recommendations None recommended by PT  Recommendations for Other Services       Functional Status Assessment Patient has had a recent decline in their functional status and demonstrates the ability to make significant improvements in function in a reasonable and predictable amount of time.     Precautions / Restrictions Precautions Precautions: Fall Restrictions LLE Weight Bearing Per Provider Order: Weight bearing as tolerated      Mobility  Bed Mobility   Bed Mobility: Sit to Supine       Sit to supine: Supervision   General bed mobility comments: no physical assist, for safety    Transfers Overall transfer level: Needs assistance Equipment used: Rolling walker (2 wheels) Transfers: Sit to/from Stand Sit to Stand: Min  assist           General transfer comment: min assist to rise and transition to RW;  verbal and tactile cues for correct hand placement, sequencing and safety    Ambulation/Gait Ambulation/Gait assistance: Min assist Gait Distance (Feet): 35 Feet Assistive device: Rolling walker (2 wheels) Gait Pattern/deviations: Step-to pattern, Wide base of support, Decreased step length - right, Decreased step length - left, Decreased stance time - left Gait velocity: decr   Pre-gait activities: lateral wt shifting, marching in place General Gait Details: multi-modal cues for RW position and safety.  Stairs            Wheelchair Mobility     Tilt Bed    Modified Rankin (Stroke Patients Only)       Balance Overall balance assessment: History of Falls, Needs assistance Sitting-balance support: Feet supported, No upper extremity supported Sitting balance-Leahy Scale: Fair Sitting balance - Comments: not challenged   Standing balance support: Reliant on assistive device for balance, During functional activity Standing balance-Leahy Scale: Poor                               Pertinent Vitals/Pain Pain Assessment Pain Assessment: Faces Faces Pain Scale: Hurts a little bit Pain Location: L hip Pain Descriptors / Indicators: Aching, Discomfort, Grimacing, Guarding Pain Intervention(s): Limited activity within patient's tolerance, Monitored during session, Repositioned    Home Living Family/patient expects to be discharged to:: Skilled nursing facility Living Arrangements: Alone  Additional Comments: sister mentions LTC placement in near future    Prior Function Prior Level of Function : Independent/Modified Independent;History of Falls (last six months)             Mobility Comments: uses rollater mostly, has RW and cane (pt sister reports pt with increasingly more difficulty safely ambulating recently) ADLs Comments: Mod I with ADLs,  has shower chair, family provided meals     Extremity/Trunk Assessment   Upper Extremity Assessment Upper Extremity Assessment: Defer to OT evaluation;Generalized weakness    Lower Extremity Assessment Lower Extremity Assessment: LLE deficits/detail;RLE deficits/detail RLE Deficits / Details: grossly WFL LLE Deficits / Details: ankle WFL, knee and hip grossly 3/5, anticipated post op deficits       Communication   Communication Communication: No apparent difficulties    Cognition Arousal: Alert Behavior During Therapy: WFL for tasks assessed/performed   PT - Cognitive impairments: History of cognitive impairments, Memory                       PT - Cognition Comments: decr STM per pt sister; cognitive dysfunction d/t MS Following commands: Impaired Following commands impaired: Follows one step commands with increased time, Follows multi-step commands inconsistently     Cueing       General Comments      Exercises Total Joint Exercises Ankle Circles/Pumps: AROM, Both   Assessment/Plan    PT Assessment Patient needs continued PT services  PT Problem List Decreased strength;Decreased mobility;Decreased activity tolerance;Decreased balance;Decreased knowledge of use of DME;Decreased safety awareness       PT Treatment Interventions DME instruction;Therapeutic exercise;Gait training;Functional mobility training;Therapeutic activities;Patient/family education;Balance training    PT Goals (Current goals can be found in the Care Plan section)  Acute Rehab PT Goals Patient Stated Goal: rehab PT Goal Formulation: With patient Time For Goal Achievement: 08/04/24 Potential to Achieve Goals: Good    Frequency Min 4X/week     Co-evaluation               AM-PAC PT 6 Clicks Mobility  Outcome Measure Help needed turning from your back to your side while in a flat bed without using bedrails?: A Little Help needed moving from lying on your back to  sitting on the side of a flat bed without using bedrails?: A Little Help needed moving to and from a bed to a chair (including a wheelchair)?: A Little Help needed standing up from a chair using your arms (e.g., wheelchair or bedside chair)?: A Little Help needed to walk in hospital room?: A Little Help needed climbing 3-5 steps with a railing? : A Lot 6 Click Score: 17    End of Session Equipment Utilized During Treatment: Gait belt Activity Tolerance: Patient tolerated treatment well Patient left: with call bell/phone within reach;in bed;with family/visitor present;with bed alarm set;with nursing/sitter in room Nurse Communication: Mobility status PT Visit Diagnosis: Other abnormalities of gait and mobility (R26.89);Unsteadiness on feet (R26.81);History of falling (Z91.81)    Time: 8649-8585 PT Time Calculation (min) (ACUTE ONLY): 24 min   Charges:   PT Evaluation $PT Eval Low Complexity: 1 Low PT Treatments $Gait Training: 8-22 mins PT General Charges $$ ACUTE PT VISIT: 1 Visit         Jaspal Pultz, PT  Acute Rehab Dept [facility]) [phone]  07/21/2024   [facility] 07/21/2024, 2:24 PM

## 2024-07-21 NOTE — Evaluation (Signed)
 Occupational Therapy Evaluation Patient Details Name: Gabriel Hanson MRN: [id number] DOB: 12-21-1950 Today's Date: 07/21/2024   History of Present Illness   73 y.o. male admitted to the hospital with left hip fracture after mechanical fall at home yesterday, found to have a L hip fracture. ortho consulted. pt is no ws/p L THA, AA  PMH: MS and cognitive dysfunction     Clinical Impressions Pt presents with decline in function and safety with ADLs and ADL mobility with impaired strength, balance, endurance and cognition. PTA pt lives alone in an apartment, was Mod I with ADLs, has shower chair, family provided meals.uses rollater mostly, has RW and cane.mod A Pt's siter lives in [place] and check on him a few times a week. Pt currently requires Sup to it EOB, mod - max A with LB ADLS, mod A with toileting tasks and min/min A STS, waling and transfers using RW. Pt initially STS from EOB, mod verbal and physical cues for correct hand placement, sequencing and safe use of RW. OT will follow acutely to maximize level of functio and safety     If plan is discharge home, recommend the following:   A lot of help with bathing/dressing/bathroom;A lot of help with walking and/or transfers;Assistance with cooking/housework;Direct supervision/assist for financial management;Supervision due to cognitive status;Assist for transportation;Help with stairs or ramp for entrance     Functional Status Assessment   Patient has had a recent decline in their functional status and demonstrates the ability to make significant improvements in function in a reasonable and predictable amount of time.     Equipment Recommendations   Tub/shower bench (grabs bars)     Recommendations for Other Services         Precautions/Restrictions   Precautions Precautions: Fall Restrictions Weight Bearing Restrictions Per Provider Order: No LLE Weight Bearing Per Provider Order: Weight bearing as tolerated      Mobility Bed Mobility Overal bed mobility: Needs Assistance Bed Mobility: Supine to Sit     Supine to sit: Supervision, HOB elevated     General bed mobility comments: no physical assist    Transfers Overall transfer level: Needs assistance Equipment used: Rolling walker (2 wheels) Transfers: Sit to/from Stand, Bed to chair/wheelchair/BSC Sit to Stand: Mod assist, Min assist Stand pivot transfers: Mod assist   Step pivot transfers: Min assist     General transfer comment: mod A Initially STS from EOB, mod verbal and physical cues for correct hand placement, sequencing and safe use of RW      Balance                                           ADL either performed or assessed with clinical judgement   ADL Overall ADL's : Needs assistance/impaired Eating/Feeding: Independent;Sitting   Grooming: Wash/dry hands;Wash/dry face;Contact guard assist;Standing   Upper Body Bathing: Set up;Supervision/ safety;Sitting   Lower Body Bathing: Moderate assistance   Upper Body Dressing : Set up;Maximal assistance;Sitting   Lower Body Dressing: Maximal assistance   Toilet Transfer: Moderate assistance;Minimal assistance;Ambulation;Rolling walker (2 wheels);Cueing for safety;Cueing for sequencing   Toileting- Clothing Manipulation and Hygiene: Moderate assistance;Sit to/from stand       Functional mobility during ADLs: Moderate assistance;Minimal assistance;Rolling walker (2 wheels);Cueing for safety;Cueing for sequencing       Vision Baseline Vision/History: 1 Wears glasses Ability to See in Adequate Light: 0 Adequate Patient  Visual Report: No change from baseline       Perception         Praxis         Pertinent Vitals/Pain Pain Assessment Pain Assessment: Faces Faces Pain Scale: Hurts little more Pain Location: L hip Pain Descriptors / Indicators: Aching, Discomfort, Grimacing, Guarding Pain Intervention(s): Limited activity within patient's  tolerance, Premedicated before session, Repositioned, Monitored during session     Extremity/Trunk Assessment Upper Extremity Assessment Upper Extremity Assessment: Generalized weakness;Right hand dominant   Lower Extremity Assessment Lower Extremity Assessment: Defer to PT evaluation       Communication Communication Communication: No apparent difficulties   Cognition Arousal: Alert   Cognition: History of cognitive impairments             OT - Cognition Comments: memory impairments, poor safety awareness, poor insight into impairments/deficits                 Following commands: Impaired Following commands impaired: Follows one step commands inconsistently     Cueing  General Comments   Cueing Techniques: Verbal cues;Visual cues      Exercises     Shoulder Instructions      Home Living Family/patient expects to be discharged to:: Skilled nursing facility Living Arrangements: Alone                               Additional Comments: sister mentions LTC placement in near future      Prior Functioning/Environment Prior Level of Function : Independent/Modified Independent;History of Falls (last six months)             Mobility Comments: uses rollater mostly, has RW and cane ADLs Comments: Mod I with ADLs, has shower chair, family provided meals    OT Problem List: Decreased strength;Decreased knowledge of use of DME or AE;Decreased cognition;Decreased activity tolerance;Impaired balance (sitting and/or standing);Decreased safety awareness;Pain   OT Treatment/Interventions: Self-care/ADL training;Therapeutic exercise;Patient/family education;Balance training;Therapeutic activities;DME and/or AE instruction      OT Goals(Current goals can be found in the care plan section)   Acute Rehab OT Goals Patient Stated Goal: go to rehab OT Goal Formulation: With patient/family Time For Goal Achievement: 08/04/24 Potential to Achieve  Goals: Good ADL Goals Pt Will Perform Grooming: with supervision;with set-up;standing Pt Will Perform Upper Body Bathing: with set-up;sitting Pt Will Perform Lower Body Bathing: with min assist;sitting/lateral leans;sit to/from stand Pt Will Perform Upper Body Dressing: with set-up;sitting Pt Will Transfer to Toilet: with min assist;with contact guard assist;ambulating Pt Will Perform Toileting - Clothing Manipulation and hygiene: with min assist;with contact guard assist;sitting/lateral leans;sit to/from stand   OT Frequency:  Min 2X/week    Co-evaluation              AM-PAC OT 6 Clicks Daily Activity     Outcome Measure Help from another person eating meals?: None Help from another person taking care of personal grooming?: A Little Help from another person toileting, which includes using toliet, bedpan, or urinal?: A Lot Help from another person bathing (including washing, rinsing, drying)?: A Lot Help from another person to put on and taking off regular upper body clothing?: A Little Help from another person to put on and taking off regular lower body clothing?: A Lot 6 Click Score: 16   End of Session Equipment Utilized During Treatment: Gait belt;Rolling walker (2 wheels) Nurse Communication: Mobility status  Activity Tolerance: Patient tolerated treatment well Patient left:  in chair;with call bell/phone within reach;with chair alarm set;with family/visitor present  OT Visit Diagnosis: Unsteadiness on feet (R26.81);Other abnormalities of gait and mobility (R26.89);History of falling (Z91.81);Muscle weakness (generalized) (M62.81)                Time: 8757-8688 OT Time Calculation (min): 29 min Charges:  OT Evaluation $OT Eval Moderate Complexity: 1 Mod OT Treatments $Therapeutic Activity: 8-22 mins   Jacques Karna Loose 07/21/2024, 1:23 PM

## 2024-07-22 ENCOUNTER — Telehealth (HOSPITAL_COMMUNITY): Payer: Self-pay | Admitting: Pharmacy Technician

## 2024-07-22 ENCOUNTER — Other Ambulatory Visit (HOSPITAL_COMMUNITY): Payer: Self-pay

## 2024-07-22 DIAGNOSIS — S72012A Unspecified intracapsular fracture of left femur, initial encounter for closed fracture: Secondary | ICD-10-CM | POA: Diagnosis not present

## 2024-07-22 LAB — CBC
HCT: 33.4 % — ABNORMAL LOW (ref 39.0–52.0)
Hemoglobin: 11.3 g/dL — ABNORMAL LOW (ref 13.0–17.0)
MCH: 31.4 pg (ref 26.0–34.0)
MCHC: 33.8 g/dL (ref 30.0–36.0)
MCV: 92.8 fL (ref 80.0–100.0)
Platelets: 190 K/uL (ref 150–400)
RBC: 3.6 MIL/uL — ABNORMAL LOW (ref 4.22–5.81)
RDW: 13.1 % (ref 11.5–15.5)
WBC: 8.4 K/uL (ref 4.0–10.5)
nRBC: 0 % (ref 0.0–0.2)

## 2024-07-22 LAB — BASIC METABOLIC PANEL WITH GFR
Anion gap: 6 (ref 5–15)
Anion gap: 9 (ref 5–15)
BUN: 13 mg/dL (ref 8–23)
BUN: 13 mg/dL (ref 8–23)
CO2: 29 mmol/L (ref 22–32)
CO2: 28 mmol/L (ref 22–32)
Calcium: 8.9 mg/dL (ref 8.9–10.3)
Calcium: 8.7 mg/dL — ABNORMAL LOW (ref 8.9–10.3)
Chloride: 98 mmol/L (ref 98–111)
Chloride: 98 mmol/L (ref 98–111)
Creatinine, Ser: 0.69 mg/dL (ref 0.61–1.24)
Creatinine, Ser: 0.61 mg/dL (ref 0.61–1.24)
GFR, Estimated: >60 mL/min (ref >60)
GFR, Estimated: >60 mL/min (ref >60)
Glucose, Bld: 98 mg/dL (ref 70–99)
Glucose, Bld: 85 mg/dL (ref 70–99)
Potassium: 5.3 mmol/L — ABNORMAL HIGH (ref 3.5–5.1)
Potassium: 4.3 mmol/L (ref 3.5–5.1)
Sodium: 133 mmol/L — ABNORMAL LOW (ref 135–145)
Sodium: 134 mmol/L — ABNORMAL LOW (ref 135–145)

## 2024-07-22 MED ORDER — LACTATED RINGERS IV BOLUS
500.0000 mL | Freq: Once | INTRAVENOUS | Status: AC
  Administered 2024-07-22: 500 mL via INTRAVENOUS

## 2024-07-22 NOTE — Plan of Care (Signed)
  Problem: Education: Goal: Knowledge of General Education information will improve Description: Including pain rating scale, medication(s)/side effects and non-pharmacologic comfort measures Outcome: Progressing   Problem: Health Behavior/Discharge Planning: Goal: Ability to manage health-related needs will improve Outcome: Adequate for Discharge   Problem: Clinical Measurements: Goal: Ability to maintain clinical measurements within normal limits will improve Outcome: Progressing Goal: Will remain free from infection Outcome: Progressing Goal: Diagnostic test results will improve Outcome: Progressing Goal: Respiratory complications will improve Outcome: Adequate for Discharge Goal: Cardiovascular complication will be avoided Outcome: Adequate for Discharge   Problem: Activity: Goal: Risk for activity intolerance will decrease Outcome: Progressing   Problem: Coping: Goal: Level of anxiety will decrease Outcome: Progressing   Problem: Elimination: Goal: Will not experience complications related to bowel motility Outcome: Progressing   Problem: Pain Managment: Goal: General experience of comfort will improve and/or be controlled Outcome: Adequate for Discharge   Problem: Safety: Goal: Ability to remain free from injury will improve Outcome: Progressing   Problem: Skin Integrity: Goal: Risk for impaired skin integrity will decrease Outcome: Progressing   Problem: Education: Goal: Verbalization of understanding the information provided (i.e., activity precautions, restrictions, etc) will improve Outcome: Progressing   Problem: Activity: Goal: Ability to ambulate and perform ADLs will improve Outcome: Adequate for Discharge   Problem: Clinical Measurements: Goal: Postoperative complications will be avoided or minimized Outcome: Progressing   Problem: Self-Concept: Goal: Ability to maintain and perform role responsibilities to the fullest extent possible will  improve Outcome: Progressing   Problem: Pain Management: Goal: Pain level will decrease Outcome: Completed/Met

## 2024-07-22 NOTE — Progress Notes (Signed)
 Physical Therapy Treatment Patient Details Name: Gabriel Hanson MRN: [id number] DOB: 06/16/1951 Today's Date: 07/22/2024   History of Present Illness 73 y.o. male admitted to the hospital with left hip fracture after mechanical fall at home yesterday, found to have a L hip fracture. ortho consulted. pt is no ws/p L THA, AA  PMH: MS and cognitive dysfunction    PT Comments  Pt progressing steadily this session; activity tol improving with pt  able to amb  incr gait distance without significantly incr pain,  tolerated THA exercises prior to amb. Anticipate continued progress in post acute rehab, plan remains appropriate at this time.   If plan is discharge home, recommend the following: A little help with walking and/or transfers;A little help with bathing/dressing/bathroom;Help with stairs or ramp for entrance;Assist for transportation   Can travel by private vehicle     Yes  Equipment Recommendations  None recommended by PT    Recommendations for Other Services       Precautions / Restrictions Precautions Precautions: Fall Recall of Precautions/Restrictions: Intact Restrictions LLE Weight Bearing Per Provider Order: Weight bearing as tolerated     Mobility  Bed Mobility Overal bed mobility: Needs Assistance Bed Mobility: Supine to Sit     Supine to sit: Supervision, HOB elevated, Used rails     General bed mobility comments: no physical assist, supervision for safety    Transfers Overall transfer level: Needs assistance Equipment used: Rolling walker (2 wheels) Transfers: Sit to/from Stand Sit to Stand: Min assist, From elevated surface           General transfer comment: min assist to rise and transition to RW;  verbal and tactile cues for correct hand placement, sequencing and safety    Ambulation/Gait Ambulation/Gait assistance: Min assist, Contact guard assist Gait Distance (Feet): 80 Feet Assistive device: Rolling walker (2 wheels) Gait  Pattern/deviations: Step-to pattern, Wide base of support, Decreased stance time - left, Decreased step length - right Gait velocity: decr     General Gait Details: multi-modal cues for trunk extension,  RW position and safety.   Stairs             Wheelchair Mobility     Tilt Bed    Modified Rankin (Stroke Patients Only)       Balance   Sitting-balance support: Feet supported, No upper extremity supported Sitting balance-Leahy Scale: Fair Sitting balance - Comments: at least Fair, not significnatly challenged. pt is able to long sit in bed and remove gait belt from foot   Standing balance support: Reliant on assistive device for balance, During functional activity Standing balance-Leahy Scale: Poor                              Communication Communication Communication: No apparent difficulties  Cognition Arousal: Alert Behavior During Therapy: WFL for tasks assessed/performed   PT - Cognitive impairments: History of cognitive impairments, Memory                       PT - Cognition Comments: decr STM per pt sister; cognitive dysfunction d/t MS Following commands: Impaired Following commands impaired: Follows one step commands with increased time, Follows multi-step commands inconsistently    Cueing Cueing Techniques: Verbal cues, Visual cues  Exercises Total Joint Exercises Ankle Circles/Pumps: AROM, Both, 10 reps Short Arc Quad: AAROM, AROM, Left, 10 reps Heel Slides: AROM, AAROM, Left, 10 reps    General  Comments        Pertinent Vitals/Pain Pain Assessment Pain Assessment: Faces Faces Pain Scale: Hurts little more Pain Location: L hip Pain Descriptors / Indicators: Aching, Discomfort, Grimacing, Guarding Pain Intervention(s): Limited activity within patient's tolerance, Monitored during session, Premedicated before session, Repositioned    Home Living                          Prior Function            PT  Goals (current goals can now be found in the care plan section) Acute Rehab PT Goals Patient Stated Goal: rehab PT Goal Formulation: With patient Time For Goal Achievement: 08/04/24 Potential to Achieve Goals: Good Progress towards PT goals: Progressing toward goals    Frequency    Min 4X/week      PT Plan      Co-evaluation              AM-PAC PT 6 Clicks Mobility   Outcome Measure  Help needed turning from your back to your side while in a flat bed without using bedrails?: A Little Help needed moving from lying on your back to sitting on the side of a flat bed without using bedrails?: A Little Help needed moving to and from a bed to a chair (including a wheelchair)?: A Little Help needed standing up from a chair using your arms (e.g., wheelchair or bedside chair)?: A Little Help needed to walk in hospital room?: A Little Help needed climbing 3-5 steps with a railing? : A Lot 6 Click Score: 17    End of Session Equipment Utilized During Treatment: Gait belt Activity Tolerance: Patient tolerated treatment well Patient left: in chair;with call bell/phone within reach;with chair alarm set Nurse Communication: Mobility status (notified pt OOB, door left ajar for pt safety) PT Visit Diagnosis: Other abnormalities of gait and mobility (R26.89);Unsteadiness on feet (R26.81);History of falling (Z91.81)     Time: 8475-8449 PT Time Calculation (min) (ACUTE ONLY): 26 min  Charges:    $Gait Training: 23-37 mins PT General Charges $$ ACUTE PT VISIT: 1 Visit                     Beauden Tremont, PT  Acute Rehab Dept [facility]) [phone]  07/22/2024    [facility] 07/22/2024, 3:55 PM

## 2024-07-22 NOTE — TOC Progression Note (Signed)
 Transition of Care [facility]) - Progression Note    Patient Details  Name: Gabriel Hanson MRN: [id number] Date of Birth: 1951/05/07  Transition of Care [facility]) CM/SW Contact  Alfonse JONELLE Rex, RN Phone Number: 07/22/2024, 2:47 PM  Clinical Narrative:   Met with patient/patient's sister [name] on patient's mobile speaker phone, short term rehab bed offers reviewed ETTER Number, [place], [place], [place]), patient/[name] accepted bed offer at [facility] [place], admit coord w/[facility] notified, confirmed bed available tomorrow, team notified.                       Expected Discharge Plan and Services                                               Social Drivers of Health (SDOH) Interventions SDOH Screenings   Food Insecurity: No Food Insecurity (07/19/2024)  Housing: Low Risk  (07/19/2024)  Transportation Needs: No Transportation Needs (07/19/2024)  Utilities: Not At Risk (07/19/2024)  Financial Resource Strain: Low Risk  (09/28/2017)  Physical Activity: Inactive (09/28/2017)  Social Connections: Socially Isolated (07/19/2024)  Stress: Stress Concern Present (09/28/2017)  Tobacco Use: High Risk (07/20/2024)    Readmission Risk Interventions    07/21/2024    2:42 PM  Readmission Risk Prevention Plan  Post Dischage Appt Not Complete  Appt Comments No PCP listed, but has traditional Medicare  Medication Screening Complete  Transportation Screening Complete

## 2024-07-22 NOTE — Progress Notes (Signed)
 Progress Note   Patient: Gabriel Hanson FMW:[id number] DOB: 11-17-1950 DOA: 07/19/2024     3 DOS: the patient was seen and examined on 07/22/2024   Brief hospital course: 73yo with h/o MS with cognitive dysfunction who presented with a mechanical fall and was found to have a L hip fracture.  He lives alone and ambulates with a walker at baseline.  Total hip arthroplasty with Dr. Jerri on 10/12.  Assessment and Plan:   *Patient is medically stable when a bed is available for STR.  He has been accepted at [facility] and has insurance authorization, anticipated dc on 10/15.  Left femoral neck fracture Unwitnessed mechanical fall at home  Head CT negative and no other injuries noted Admitted to med surg Orthopedics performed total hip arthroplasty on 10/12 Doing well, ortho has signed off PT/OT consulting, recommend STR He is medically stable when able to be transferred to a facility He would be delighted to go back to [facility]   Anterior lumbar wedge fractures Not particularly symptomatic Sister states that he has some known spinal abnormality in the lumbar region that his neurologist has mentioned in the past, so suspect this is chronic Discussed with neurosurgery, who does not recommend any further treatment or evaluation   MS with ambulatory dysfunction, cognitive impairment Continue Diroximel twice daily Ambulated short distances with a walker at home prior to fall   BPH Continue silodosin  (tamsulosin  per formulary)   ADHD Hold Adderall - he will not be performing tasks that require attention and focus as an inpatient    DNR I have discussed code status with the family and the patient would not desire resuscitation and would prefer to die a natural death should that situation arise. Patient will need a gold out of facility DNR form at the time of discharge           Consultants: Orthopedics   Procedures: Total hip arthroplasty 10/12   Antibiotics: Cefazolin x 3  doses  30 Day Unplanned Readmission Risk Score    Flowsheet Row ED to Hosp-Admission (Current) from 07/19/2024 in [facility]  30 Day Unplanned Readmission Risk Score (%) 8.64 Filed at 07/22/2024 1200    This score is the patient's risk of an unplanned readmission within 30 days of being discharged (0 -100%). The score is based on dignosis, age, lab data, medications, orders, and past utilization.   Low:  0-14.9   Medium: 15-21.9   High: 22-29.9   Extreme: 30 and above           Subjective: Feeling ok, eating well, still no BM. He has struggled with constipation lately.   Objective: Vitals:   07/22/24 0940 07/22/24 1352  BP: 132/66 115/63  Pulse: 84 84  Resp: 12 18  Temp: 97.9 F (36.6 C) 97.7 F (36.5 C)  SpO2: 95% 97%    Intake/Output Summary (Last 24 hours) at 07/22/2024 1519 Last data filed at 07/22/2024 0940 Gross per 24 hour  Intake 340 ml  Output 2000 ml  Net -1660 ml   Filed Weights   07/19/24 2140  Weight: 81 kg    Exam:   General:  Appears calm and comfortable and is in NAD Eyes:  normal lids, iris; poor eye contact ENT:  grossly normal hearing, lips & tongue, mmm Cardiovascular:  RRR. No LE edema.  Respiratory:   CTA bilaterally with no wheezes/rales/rhonchi.  Normal respiratory effort. Abdomen:  soft, NT, ND Skin:  no rash or induration seen on limited exam  Musculoskeletal:  L hip bandage is C/D/I, no gross deformity appreciated Psychiatric: blunted mood and affect, speech fluent and appropriate, AOx3 Neurologic:  CN 2-12 grossly intact, moves all extremities in coordinated fashion other than  LLE  Data Reviewed: I have reviewed the patient's lab results since admission.  Pertinent labs for today include:   Na++ 133, 134 K+ 5.3, 4.3 WBC 8.4 Hgb 11.3, stable     Family Communication: None present  Mobility: PT/OT Consulted and are recommending - Skilled Nursing-Short Term Rehab (<3 Hours/Day)07/21/2024 1422    Code  Status: Limited: Do not attempt resuscitation (DNR) -DNR-LIMITED -Do Not Intubate/DNI    Disposition: Status is: Inpatient Remains inpatient appropriate because: awaiting placement     Time spent: 50 minutes  Unresulted Labs (From admission, onward)     Start     Ordered   07/19/24 1636  Urine Culture  Once,   R        07/19/24 1636             Author: Delon Herald, MD 07/22/2024 3:19 PM  For on call review [email or web address].

## 2024-07-22 NOTE — Telephone Encounter (Signed)
 Patient Product/process development scientist completed.    The patient is insured through [facility]. Patient has Medicare and is not eligible for a copay card, but may be able to apply for patient assistance or Medicare RX Payment Plan (Patient Must reach out to their plan, if eligible for payment plan), if available.    Ran test claim for enoxaparin  (Lovenox ) 40 mg/0.4 ml inj and the current 14 day co-pay is $0.00.   This test claim was processed through [facility]- copay amounts may vary at other pharmacies due to pharmacy/plan contracts, or as the patient moves through the different stages of their insurance plan.     Reyes Sharps, CPHT Pharmacy Technician Patient Advocate Specialist Lead [facility] Patient Advocate Team Direct Number: [phone]  Fax: [phone]

## 2024-07-22 NOTE — Progress Notes (Signed)
 Subjective: 2 Days Post-Op Procedure(s) (LRB): ARTHROPLASTY, HIP, TOTAL, ANTERIOR APPROACH (Left) Patient reports pain as mild.    Objective: Vital signs in last 24 hours: Temp:  [97.7 F (36.5 C)-98.2 F (36.8 C)] 98.2 F (36.8 C) (10/14 0446) Pulse Rate:  [71-85] 85 (10/14 0446) Resp:  [15-17] 15 (10/14 0446) BP: (124-143)/(69-78) 143/73 (10/14 0446) SpO2:  [95 %-97 %] 95 % (10/14 0446)  Intake/Output from previous day: 10/13 0701 - 10/14 0700 In: 720 [P.O.:720] Out: 2500 [Urine:2500] Intake/Output this shift: No intake/output data recorded.  Recent Labs    07/19/24 1302 07/20/24 1236 07/21/24 1008 07/22/24 0403  HGB 14.7 13.2 11.4* 11.3*   Recent Labs    07/21/24 1008 07/22/24 0403  WBC 11.0* 8.4  RBC 3.76* 3.60*  HCT 35.0* 33.4*  PLT 178 190   Recent Labs    07/21/24 1008 07/22/24 0403  NA 134* 133*  K 4.2 5.3*  CL 100 98  CO2 26 29  BUN 15 13  CREATININE 0.75 0.69  GLUCOSE 111* 98  CALCIUM 8.8* 8.9   No results for input(s): LABPT, INR in the last 72 hours.  Neurologically intact Neurovascular intact Sensation intact distally Intact pulses distally Dorsiflexion/Plantar flexion intact Incision: dressing C/D/I No cellulitis present Compartment soft   Assessment/Plan: 2 Days Post-Op Procedure(s) (LRB): ARTHROPLASTY, HIP, TOTAL, ANTERIOR APPROACH (Left) Up with therapy WBAT LLE Dvt ppx- lovenox /scds.  Rx on chart Post-op pain control- oxy- rx on chart D/c dispo per primary team F/u with ortho two weeks post-op     Ronal LITTIE Grave 07/22/2024, 7:24 AM

## 2024-07-23 DIAGNOSIS — L22 Diaper dermatitis: Secondary | ICD-10-CM | POA: Diagnosis not present

## 2024-07-23 DIAGNOSIS — R638 Other symptoms and signs concerning food and fluid intake: Secondary | ICD-10-CM | POA: Diagnosis not present

## 2024-07-23 DIAGNOSIS — R41841 Cognitive communication deficit: Secondary | ICD-10-CM | POA: Diagnosis not present

## 2024-07-23 DIAGNOSIS — M6281 Muscle weakness (generalized): Secondary | ICD-10-CM | POA: Diagnosis not present

## 2024-07-23 DIAGNOSIS — S72012A Unspecified intracapsular fracture of left femur, initial encounter for closed fracture: Secondary | ICD-10-CM | POA: Diagnosis not present

## 2024-07-23 DIAGNOSIS — Z7401 Bed confinement status: Secondary | ICD-10-CM | POA: Diagnosis not present

## 2024-07-23 DIAGNOSIS — W19XXXD Unspecified fall, subsequent encounter: Secondary | ICD-10-CM | POA: Diagnosis not present

## 2024-07-23 DIAGNOSIS — R531 Weakness: Secondary | ICD-10-CM | POA: Diagnosis not present

## 2024-07-23 DIAGNOSIS — M25561 Pain in right knee: Secondary | ICD-10-CM | POA: Diagnosis not present

## 2024-07-23 DIAGNOSIS — L408 Other psoriasis: Secondary | ICD-10-CM | POA: Diagnosis not present

## 2024-07-23 DIAGNOSIS — N400 Enlarged prostate without lower urinary tract symptoms: Secondary | ICD-10-CM | POA: Diagnosis not present

## 2024-07-23 DIAGNOSIS — S32010D Wedge compression fracture of first lumbar vertebra, subsequent encounter for fracture with routine healing: Secondary | ICD-10-CM | POA: Diagnosis not present

## 2024-07-23 DIAGNOSIS — F909 Attention-deficit hyperactivity disorder, unspecified type: Secondary | ICD-10-CM | POA: Diagnosis not present

## 2024-07-23 DIAGNOSIS — G35D Multiple sclerosis, unspecified: Secondary | ICD-10-CM | POA: Diagnosis not present

## 2024-07-23 DIAGNOSIS — S72002D Fracture of unspecified part of neck of left femur, subsequent encounter for closed fracture with routine healing: Secondary | ICD-10-CM | POA: Diagnosis not present

## 2024-07-23 DIAGNOSIS — G3184 Mild cognitive impairment, so stated: Secondary | ICD-10-CM | POA: Diagnosis not present

## 2024-07-23 DIAGNOSIS — K219 Gastro-esophageal reflux disease without esophagitis: Secondary | ICD-10-CM | POA: Diagnosis not present

## 2024-07-23 DIAGNOSIS — R2689 Other abnormalities of gait and mobility: Secondary | ICD-10-CM | POA: Diagnosis not present

## 2024-07-23 DIAGNOSIS — F32A Depression, unspecified: Secondary | ICD-10-CM | POA: Diagnosis not present

## 2024-07-23 DIAGNOSIS — K5900 Constipation, unspecified: Secondary | ICD-10-CM | POA: Diagnosis not present

## 2024-07-23 DIAGNOSIS — M50321 Other cervical disc degeneration at C4-C5 level: Secondary | ICD-10-CM | POA: Diagnosis not present

## 2024-07-23 MED ORDER — POLYETHYLENE GLYCOL 3350 17 G PO PACK: 17.0000 g | PACK | Freq: Every day | ORAL | 0 refills | Status: AC

## 2024-07-23 MED ORDER — DOCUSATE SODIUM 100 MG PO CAPS: 100.0000 mg | ORAL_CAPSULE | Freq: Two times a day (BID) | ORAL | 0 refills | Status: AC

## 2024-07-23 NOTE — Discharge Summary (Addendum)
 Physician Discharge Summary  MISTY RAGO FMW:[id number] DOB: Oct 11, 1950 DOA: 07/19/2024  PCP: Patient, No Pcp Per  Admit date: 07/19/2024 Discharge date: 07/23/2024  Admitted From: Home Disposition: SNF  Recommendations for Outpatient Follow-up:  Follow up with SNF provider at earliest convenience Outpatient follow-up with orthopedics.  Discharge pain management/DVT prophylaxis/activity/wound care as per orthopedics recommendations  follow up in ED if symptoms worsen or new appear   Home Health: No Equipment/Devices: None  Discharge Condition: Stable CODE STATUS: DNR Diet recommendation: Heart healthy  Brief/Interim Summary: 73yo with h/o MS with cognitive dysfunction who presented with a mechanical fall and was found to have a L hip fracture.  He underwent total hip arthroplasty with Dr. Jerri on 07/20/2024.  Subsequently he has remained hemodynamically stable.  PT recommended SNF placement.  He is currently medically stable for discharge.  He will be discharged to SNF once bed is available.  Discharge Diagnoses:   Left femoral neck fracture after mechanical fall -underwent total hip arthroplasty with Dr. Jerri on 07/20/2024.  Subsequently he has remained hemodynamically stable.  PT recommended SNF placement.  He is currently medically stable for discharge.  He will be discharged to SNF once bed is available. -Discharge pain management/DVT prophylaxis/activity/wound care as per orthopedics recommendations   Anterior lumbar wedge fractures -Not particularly symptomatic -Sister states that he has some known spinal abnormality in the lumbar region that his neurologist has mentioned in the past, so suspect this is chronic - Prior hospitalist discussed with neurosurgery, who did not recommend any further treatment or evaluation  Multiple sclerosis with ambulatory dysfunction, cognitive impairment - Continue Diroximel twice daily.  Outpatient follow-up with neurology  BPH - Continue  silodosin .  Outpatient follow-up with urology  ADHD - Adderall on hold for now  Discharge Instructions  Discharge Instructions     Diet - low sodium heart healthy   Complete by: As directed    Discharge wound care:   Complete by: As directed    As per orthopedics recommendations   Increase activity slowly   Complete by: As directed    Weight bearing as tolerated   Complete by: As directed       Allergies as of 07/23/2024   No Known Allergies      Medication List     STOP taking these medications    amphetamine -dextroamphetamine  20 MG tablet Commonly known as: Adderall   [employer] Senna 8.6 MG tablet Generic drug: senna   gabapentin  300 MG capsule Commonly known as: NEURONTIN    naproxen sodium 220 MG tablet Commonly known as: ALEVE   TRAMADOL HCL PO       TAKE these medications    docusate sodium 100 MG capsule Commonly known as: COLACE Take 1 capsule (100 mg total) by mouth 2 (two) times daily.   enoxaparin  40 MG/0.4ML injection Commonly known as: LOVENOX  Inject 0.4 mLs (40 mg total) into the skin daily for 14 days.   oxyCODONE -acetaminophen  5-325 MG tablet Commonly known as: Percocet Take 1-2 tablets by mouth 2 (two) times daily as needed for severe pain (pain score 7-10).   polyethylene glycol 17 g packet Commonly known as: MIRALAX / GLYCOLAX Take 17 g by mouth daily.   silodosin  8 MG Caps capsule Commonly known as: RAPAFLO  Take 1 capsule (8 mg total) by mouth daily with breakfast.   Vumerity  231 MG Cpdr Generic drug: Diroximel Fumarate  Take 1 capsule by mouth 2 (two) times daily.  Discharge Care Instructions  (From admission, onward)           Start     Ordered   07/23/24 0000  Discharge wound care:       Comments: As per orthopedics recommendations   07/23/24 0824   07/20/24 0000  Weight bearing as tolerated        07/20/24 0729              Contact information for follow-up providers     Jerri Kay HERO,  MD Follow up in 2 week(s).   Specialty: Orthopedic Surgery Why: For suture removal, For wound re-check Contact information: [address] [place]  [place] [address] [phone]              Contact information for after-discharge care     Destination     [facility] .   Service: Skilled Nursing Contact information: [address] [place]  [address] [phone]                    No Known Allergies  Consultations: Orthopedics   Procedures/Studies: DG Pelvis Portable Result Date: 07/20/2024 EXAM: 1 or 2 VIEW(S) XRAY OF THE PELVIS 07/20/2024 11:43:00 AM COMPARISON: 07/19/24 CLINICAL HISTORY: Status post total replacement of left hip [id number]. S/P left total hip replacement. FINDINGS: BONES AND JOINTS: Left hip arthroplasty with normal alignment of hardware components. SOFT TISSUES: Soft tissue gas consistent with recent surgery. IMPRESSION: 1. Status post left hip arthroplasty with normal alignment of hardware components. 2. Soft tissue gas consistent with recent surgery. Electronically signed by: Waddell Calk MD 07/20/2024 12:32 PM EDT RP Workstation: [id number]   DG HIP UNILAT WITH PELVIS 1V LEFT Result Date: 07/20/2024 CLINICAL DATA:  [id number] Surgery, elective [id number] EXAM: DG HIP (WITH OR WITHOUT PELVIS) 1V*L* COMPARISON:  07/19/2024. FINDINGS: The provided image demonstrates intraoperative radiographs during various phases of left hip arthroplasty in this patient with known left femur neck fracture. Please refer to the separately issued operative report for further details. Fluoroscopy time: 22 seconds Radiation dose: 3.1 mGy IMPRESSION: Intraoperative fluoroscopic guidance, as described above. Electronically Signed   By: Ree Molt M.D.   On: 07/20/2024 09:25   DG C-Arm 1-60 Min-No Report Result Date: 07/20/2024 Fluoroscopy was utilized by the requesting physician.  No radiographic interpretation.   DG C-Arm 1-60 Min-No Report Result  Date: 07/20/2024 Fluoroscopy was utilized by the requesting physician.  No radiographic interpretation.   DG Knee 1-2 Views Left Result Date: 07/19/2024 CLINICAL DATA:  Status post fall. EXAM: LEFT KNEE - 1-2 VIEW COMPARISON:  None Available. FINDINGS: No evidence of an acute fracture or dislocation. There is marked severity tricompartmental joint space narrowing. A small suprapatellar effusion is suspected. IMPRESSION: 1. Marked severity tricompartmental degenerative changes. 2. Small suprapatellar effusion. Electronically Signed   By: Suzen Dials M.D.   On: 07/19/2024 12:56   DG Lumbar Spine 2-3 Views Result Date: 07/19/2024 CLINICAL DATA:  Status post fall EXAM: LUMBAR SPINE - 2 VIEW COMPARISON:  MRI lumbar spine dated 01/11/2024 FINDINGS: Minimal anterior wedging of T12 and L1 vertebral bodies. Alignment is normal. Intervertebral disc spaces are maintained. Partially imaged large volume stool throughout the colon. IMPRESSION: 1. Minimal anterior wedging of T12 and L1 vertebral bodies, age indeterminate. 2. Partially imaged large volume stool throughout the colon. Electronically Signed   By: Limin  Xu M.D.   On: 07/19/2024 12:55   DG Hip Unilat With Pelvis 2-3 Views Left Result Date: 07/19/2024 CLINICAL DATA:  Status post  fall EXAM: DG HIP (WITH OR WITHOUT PELVIS) 3V LEFT COMPARISON:  None Available. FINDINGS: Subcapital left femoral neck fracture with severe varus angulation. No acute dislocation of the hip. IMPRESSION: Angulated subcapital left femoral neck fracture. Electronically Signed   By: Limin  Xu M.D.   On: 07/19/2024 12:53   CT Cervical Spine Wo Contrast Result Date: 07/19/2024 EXAM: CT CERVICAL SPINE WITHOUT CONTRAST 07/19/2024 12:14:16 PM TECHNIQUE: CT of the cervical spine was performed without the administration of intravenous contrast. Multiplanar reformatted images are provided for review. Automated exposure control, iterative reconstruction, and/or weight based adjustment  of the mA/kV was utilized to reduce the radiation dose to as low as reasonably achievable. COMPARISON: MRI of the cervical spine 01/11/2024. CLINICAL HISTORY: Neck trauma (Age >= 65y). FINDINGS: CERVICAL SPINE: BONES AND ALIGNMENT: No acute fracture or traumatic malalignment. DEGENERATIVE CHANGES: Endplate degenerative change and facet hypertrophy are present at C4-C5. Ankylosis is present across the disc space at C4-C5, right greater than left. Right greater than left foraminal narrowing is present at C5-C6 and C6-C7. SOFT TISSUES: No prevertebral soft tissue swelling. IMPRESSION: 1. No acute abnormality of the cervical spine related to the reported neck trauma. 2. Inplate degenerative change and facet hypertrophy at C4-5 with ankylosis across the disc space, right greater than left. 3. Foraminal narrowing at C5-6 and C6-7. Electronically signed by: Lonni Necessary MD 07/19/2024 12:22 PM EDT RP Workstation: [id number]   CT Head Wo Contrast Result Date: 07/19/2024 EXAM: CT HEAD WITHOUT CONTRAST 07/19/2024 12:14:16 PM TECHNIQUE: CT of the head was performed without the administration of intravenous contrast. Automated exposure control, iterative reconstruction, and/or weight based adjustment of the mA/kV was utilized to reduce the radiation dose to as low as reasonably achievable. COMPARISON: None available. CLINICAL HISTORY: Head trauma, minor (Age >= 65y). Pt reports while walking this morning his left leg gave out and he fell to the ground. Pt reports hitting the left side of his face. Reports left leg pain at this time. Denies LOC. FINDINGS: BRAIN AND VENTRICLES: Paraventricular white matter changes are mildly advanced for age. This most likely reflects the sequelae of chronic microvascular ischemia. No acute hemorrhage. No evidence of acute infarct. No hydrocephalus. No extra-axial collection. No mass effect or midline shift. ORBITS: No acute abnormality. SINUSES: No acute abnormality. SOFT TISSUES AND  SKULL: No acute soft tissue abnormality. No skull fracture. IMPRESSION: 1. No acute intracranial abnormality. 2. Stable Mildly advanced paraventricular white matter changes for age, likely sequelae of chronic microvascular ischemia. Electronically signed by: Lonni Necessary MD 07/19/2024 12:20 PM EDT RP Workstation: [id number]      Subjective: Patient seen and examined at bedside.  Complains of intermittent left hip pain.  No fever, vomiting, abdominal pain reported.  Discharge Exam: Vitals:   07/22/24 2214 07/23/24 0601  BP: (!) 144/82 (!) 142/78  Pulse: 85 87  Resp: 18 18  Temp: (!) 97.4 F (36.3 C) (!) 97.4 F (36.3 C)  SpO2: 95% 94%    General: Pt is alert, awake, not in acute distress.  On room air.  Slow to respond.  Poor historian.  Flat affect. Cardiovascular: rate controlled, S1/S2 + Respiratory: bilateral decreased breath sounds at bases Abdominal: Soft, NT, ND, bowel sounds + Extremities: no edema, no cyanosis    The results of significant diagnostics from this hospitalization (including imaging, microbiology, ancillary and laboratory) are listed below for reference.     Microbiology: Recent Results (from the past 240 hours)  Surgical pcr screen     Status: None  Collection Time: 07/19/24 10:19 PM   Specimen: Nasal Mucosa; Nasal Swab  Result Value Ref Range Status   MRSA, PCR NEGATIVE NEGATIVE Final   Staphylococcus aureus NEGATIVE NEGATIVE Final    Comment: (NOTE) The Xpert SA Assay (FDA approved for NASAL specimens in patients 18 years of age and older), is one component of a comprehensive surveillance program. It is not intended to diagnose infection nor to guide or monitor treatment. Performed at [facility], [address] [place], [place] [address]      Labs: BNP (last 3 results) No results for input(s): BNP in the last 8760 hours. Basic Metabolic Panel: Recent Labs  Lab 07/19/24 1302 07/20/24 1236 07/21/24 1008  07/22/24 0403 07/22/24 1115  NA 136  --  134* 133* 134*  K 5.0  --  4.2 5.3* 4.3  CL 100  --  100 98 98  CO2 29  --  26 29 28   GLUCOSE 115*  --  111* 98 85  BUN 14  --  15 13 13   CREATININE 0.86 0.68 0.75 0.69 0.61  CALCIUM 9.6  --  8.8* 8.9 8.7*   Liver Function Tests: No results for input(s): AST, ALT, ALKPHOS, BILITOT, PROT, ALBUMIN in the last 168 hours. No results for input(s): LIPASE, AMYLASE in the last 168 hours. No results for input(s): AMMONIA in the last 168 hours. CBC: Recent Labs  Lab 07/19/24 1302 07/20/24 1236 07/21/24 1008 07/22/24 0403  WBC 9.2 11.9* 11.0* 8.4  NEUTROABS 8.0*  --   --   --   HGB 14.7 13.2 11.4* 11.3*  HCT 44.8 40.8 35.0* 33.4*  MCV 92.0 94.2 93.1 92.8  PLT 222 179 178 190   Cardiac Enzymes: No results for input(s): CKTOTAL, CKMB, CKMBINDEX, TROPONINI in the last 168 hours. BNP: Invalid input(s): POCBNP CBG: No results for input(s): GLUCAP in the last 168 hours. D-Dimer No results for input(s): DDIMER in the last 72 hours. Hgb A1c No results for input(s): HGBA1C in the last 72 hours. Lipid Profile No results for input(s): CHOL, HDL, LDLCALC, TRIG, CHOLHDL, LDLDIRECT in the last 72 hours. Thyroid  function studies No results for input(s): TSH, T4TOTAL, T3FREE, THYROIDAB in the last 72 hours.  Invalid input(s): FREET3 Anemia work up No results for input(s): VITAMINB12, FOLATE, FERRITIN, TIBC, IRON, RETICCTPCT in the last 72 hours. Urinalysis    Component Value Date/Time   COLORURINE RED (A) 07/19/2024 1636   APPEARANCEUR CLOUDY (A) 07/19/2024 1636   APPEARANCEUR Clear 01/16/2022 1314   LABSPEC 1.018 07/19/2024 1636   PHURINE 5.0 07/19/2024 1636   GLUCOSEU NEGATIVE 07/19/2024 1636   HGBUR NEGATIVE 07/19/2024 1636   BILIRUBINUR NEGATIVE 07/19/2024 1636   BILIRUBINUR Negative 01/16/2022 1314   KETONESUR NEGATIVE 07/19/2024 1636   PROTEINUR NEGATIVE 07/19/2024  1636   UROBILINOGEN 0.2 01/18/2017 1207   NITRITE NEGATIVE 07/19/2024 1636   LEUKOCYTESUR LARGE (A) 07/19/2024 1636   Sepsis Labs Recent Labs  Lab 07/19/24 1302 07/20/24 1236 07/21/24 1008 07/22/24 0403  WBC 9.2 11.9* 11.0* 8.4   Microbiology Recent Results (from the past 240 hours)  Surgical pcr screen     Status: None   Collection Time: 07/19/24 10:19 PM   Specimen: Nasal Mucosa; Nasal Swab  Result Value Ref Range Status   MRSA, PCR NEGATIVE NEGATIVE Final   Staphylococcus aureus NEGATIVE NEGATIVE Final    Comment: (NOTE) The Xpert SA Assay (FDA approved for NASAL specimens in patients 18 years of age and older), is one component of a  comprehensive surveillance program. It is not intended to diagnose infection nor to guide or monitor treatment. Performed at [facility], [address], [place], [place] [address]      Time coordinating discharge: 35 minutes  SIGNED:   Sophie Mao, MD  [facility] 07/23/2024, 8:24 AM

## 2024-07-23 NOTE — TOC Transition Note (Signed)
 Transition of Care [facility]) - Discharge Note   Patient Details  Name: Gabriel Hanson MRN: [id number] Date of Birth: Dec 30, 1950  Transition of Care [facility]) CM/SW Contact:  Alfonse JONELLE Rex, RN Phone Number: 07/23/2024, 11:11 AM   Clinical Narrative:   DC order to SNF [facility]), RM 410A, Call Report [phone]. Patient and his sister, Gabriel Hanson, notified of transfer.  [facility]AR for transport. No further TOC needs identified at this time.     Final next level of care: Skilled Nursing Facility Barriers to Discharge: Barriers Resolved   Patient Goals and CMS Choice Patient states their goals for this hospitalization and ongoing recovery are:: return home CMS Medicare.gov Compare Post Acute Care list provided to:: Patient Represenative (must comment) Nurse, children's (sister)) Choice offered to / list presented to : Sibling [employer] ownership interest in [facility].provided to:: Sibling    Discharge Placement              Patient chooses bed at: [facility] Patient to be transferred to facility by: [facility] Name of family member notified: Gabriel Hanson (sister) Patient and family notified of of transfer: 07/23/24  Discharge Plan and Services Additional resources added to the After Visit Summary for                                       Social Drivers of Health (SDOH) Interventions SDOH Screenings   Food Insecurity: No Food Insecurity (07/19/2024)  Housing: Low Risk  (07/19/2024)  Transportation Needs: No Transportation Needs (07/19/2024)  Utilities: Not At Risk (07/19/2024)  Financial Resource Strain: Low Risk  (09/28/2017)  Physical Activity: Inactive (09/28/2017)  Social Connections: Socially Isolated (07/19/2024)  Stress: Stress Concern Present (09/28/2017)  Tobacco Use: High Risk (07/20/2024)     Readmission Risk Interventions    07/23/2024   11:10 AM 07/21/2024    2:42 PM  Readmission Risk Prevention Plan  Post Dischage Appt Complete Not Complete  Appt  Comments  No PCP listed, but has traditional Medicare  Medication Screening  Complete  Transportation Screening  Complete

## 2024-07-23 NOTE — Progress Notes (Signed)
 Report called to [place] at [facility]. [facility] here to transport pt.

## 2024-07-23 NOTE — Care Management Important Message (Signed)
 Important Message  Patient Details IM Letter given Name: Gabriel Hanson MRN: [id number] Date of Birth: 1951/01/21   Important Message Given:  Yes - Medicare IM     Melba Ates 07/23/2024, 10:43 AM

## 2024-07-24 ENCOUNTER — Encounter: Payer: Self-pay | Admitting: Neurology

## 2024-07-24 DIAGNOSIS — S72002D Fracture of unspecified part of neck of left femur, subsequent encounter for closed fracture with routine healing: Secondary | ICD-10-CM | POA: Diagnosis not present

## 2024-07-24 DIAGNOSIS — L408 Other psoriasis: Secondary | ICD-10-CM | POA: Diagnosis not present

## 2024-07-24 DIAGNOSIS — N400 Enlarged prostate without lower urinary tract symptoms: Secondary | ICD-10-CM | POA: Diagnosis not present

## 2024-07-24 DIAGNOSIS — F32A Depression, unspecified: Secondary | ICD-10-CM | POA: Diagnosis not present

## 2024-07-24 DIAGNOSIS — L22 Diaper dermatitis: Secondary | ICD-10-CM | POA: Diagnosis not present

## 2024-07-24 DIAGNOSIS — K219 Gastro-esophageal reflux disease without esophagitis: Secondary | ICD-10-CM | POA: Diagnosis not present

## 2024-07-24 DIAGNOSIS — G35D Multiple sclerosis, unspecified: Secondary | ICD-10-CM | POA: Diagnosis not present

## 2024-07-30 DIAGNOSIS — M25561 Pain in right knee: Secondary | ICD-10-CM | POA: Diagnosis not present

## 2024-07-31 DIAGNOSIS — F32A Depression, unspecified: Secondary | ICD-10-CM | POA: Diagnosis not present

## 2024-07-31 DIAGNOSIS — K219 Gastro-esophageal reflux disease without esophagitis: Secondary | ICD-10-CM | POA: Diagnosis not present

## 2024-07-31 DIAGNOSIS — L22 Diaper dermatitis: Secondary | ICD-10-CM | POA: Diagnosis not present

## 2024-07-31 DIAGNOSIS — L408 Other psoriasis: Secondary | ICD-10-CM | POA: Diagnosis not present

## 2024-08-07 DIAGNOSIS — L22 Diaper dermatitis: Secondary | ICD-10-CM | POA: Diagnosis not present

## 2024-08-07 DIAGNOSIS — K219 Gastro-esophageal reflux disease without esophagitis: Secondary | ICD-10-CM | POA: Diagnosis not present

## 2024-08-07 DIAGNOSIS — L408 Other psoriasis: Secondary | ICD-10-CM | POA: Diagnosis not present

## 2024-08-07 DIAGNOSIS — F32A Depression, unspecified: Secondary | ICD-10-CM | POA: Diagnosis not present

## 2024-08-13 DIAGNOSIS — M25561 Pain in right knee: Secondary | ICD-10-CM | POA: Diagnosis not present

## 2024-08-13 DIAGNOSIS — Z9089 Acquired absence of other organs: Secondary | ICD-10-CM | POA: Diagnosis not present

## 2024-08-13 DIAGNOSIS — Z9181 History of falling: Secondary | ICD-10-CM | POA: Diagnosis not present

## 2024-08-13 DIAGNOSIS — Z87440 Personal history of urinary (tract) infections: Secondary | ICD-10-CM | POA: Diagnosis not present

## 2024-08-13 DIAGNOSIS — N39498 Other specified urinary incontinence: Secondary | ICD-10-CM | POA: Diagnosis not present

## 2024-08-13 DIAGNOSIS — Z96642 Presence of left artificial hip joint: Secondary | ICD-10-CM | POA: Diagnosis not present

## 2024-08-13 DIAGNOSIS — F32A Depression, unspecified: Secondary | ICD-10-CM | POA: Diagnosis not present

## 2024-08-13 DIAGNOSIS — Z604 Social exclusion and rejection: Secondary | ICD-10-CM | POA: Diagnosis not present

## 2024-08-13 DIAGNOSIS — F17210 Nicotine dependence, cigarettes, uncomplicated: Secondary | ICD-10-CM | POA: Diagnosis not present

## 2024-08-13 DIAGNOSIS — S72002D Fracture of unspecified part of neck of left femur, subsequent encounter for closed fracture with routine healing: Secondary | ICD-10-CM | POA: Diagnosis not present

## 2024-08-13 DIAGNOSIS — Z8781 Personal history of (healed) traumatic fracture: Secondary | ICD-10-CM | POA: Diagnosis not present

## 2024-08-13 DIAGNOSIS — Z96651 Presence of right artificial knee joint: Secondary | ICD-10-CM | POA: Diagnosis not present

## 2024-08-13 DIAGNOSIS — F529 Unspecified sexual dysfunction not due to a substance or known physiological condition: Secondary | ICD-10-CM | POA: Diagnosis not present

## 2024-08-13 DIAGNOSIS — N401 Enlarged prostate with lower urinary tract symptoms: Secondary | ICD-10-CM | POA: Diagnosis not present

## 2024-08-13 DIAGNOSIS — Z791 Long term (current) use of non-steroidal anti-inflammatories (NSAID): Secondary | ICD-10-CM | POA: Diagnosis not present

## 2024-08-13 DIAGNOSIS — G35D Multiple sclerosis, unspecified: Secondary | ICD-10-CM | POA: Diagnosis not present

## 2024-08-13 DIAGNOSIS — K219 Gastro-esophageal reflux disease without esophagitis: Secondary | ICD-10-CM | POA: Diagnosis not present

## 2024-08-13 DIAGNOSIS — Z556 Problems related to health literacy: Secondary | ICD-10-CM | POA: Diagnosis not present

## 2024-08-13 DIAGNOSIS — G3184 Mild cognitive impairment, so stated: Secondary | ICD-10-CM | POA: Diagnosis not present

## 2024-08-14 ENCOUNTER — Telehealth: Payer: Self-pay

## 2024-08-14 NOTE — Telephone Encounter (Signed)
 Call to Nena at [facility], advised she would need to reach out to PCP for verbal orders requested as Dr, Vear has not made any referral. She verbalized understanding

## 2024-08-14 NOTE — Telephone Encounter (Signed)
 Call to sandra at [facility]. Advised that orders requested would need to come from PCP as Dr. Vear has not made any referrals. She verbalized understanding

## 2024-08-14 NOTE — Telephone Encounter (Signed)
 Nena Rowels from [facility]-[phone] called requesting VO for Skilled Nursing  Weekly x 2 then EOW x 3 They are also requesting a Child Psychotherapist and Home Aid now. Please advise.

## 2024-08-17 ENCOUNTER — Encounter: Payer: Self-pay | Admitting: Neurology

## 2024-08-18 ENCOUNTER — Telehealth: Payer: Self-pay | Admitting: Neurology

## 2024-08-18 MED ORDER — AMPHETAMINE-DEXTROAMPHETAMINE 20 MG PO TABS: 20.0000 mg | ORAL_TABLET | Freq: Two times a day (BID) | ORAL | 0 refills | Status: DC

## 2024-08-18 NOTE — Telephone Encounter (Signed)
 Called and relayed ok to verbal orders.

## 2024-08-18 NOTE — Telephone Encounter (Signed)
 Last seen on 01/16/24 No follow up scheduled

## 2024-08-18 NOTE — Telephone Encounter (Signed)
 Pt last saw Dr. Vear 01/16/24. Currently has no f/u scheduled. Dr. Vear wanted to see him back around 07/17/2024.   MS DMT: Vumerity      Also see this note from today:    Called sister back/Phyllis. She spoke with her other sister, Delon.  States pt recently fell/broke hip, had hip replacement. Went to [facility] for 2.5-3wk. Home now. Doing well with this. However, memory worse.  He told her yesterday he needed medicine and she offered to request refills for him. Getting Vumerity  dose confused.  She will confirm with Delon that he is taking 1 tablet twice daily as recommended. She also asked if he requests something to call herself or other sister, Delon to confirm info accurate before we do anything. They have POA over him.   Current he is set up with home health nurse for the next three weeks. [name]/sister plans to be present for these visits. Delon thought PT/OT was going to be ordered. She will have her f/u on this request.   She will call back if anything further needed.    Noticed at [facility], more coherent and not allowed to smoke. Back to smoking at home.

## 2024-08-18 NOTE — Telephone Encounter (Signed)
 Pt's sister Quillian on [place]) call # [phone] states she and her sister have concerns re: pt's memory,  She is asking to be called before anything is relayed to pt.  She is wanting a call from RN to discuss concerns she and her sister (also on [place]) have about pt.

## 2024-08-18 NOTE — Telephone Encounter (Signed)
 [facility]) requesting verbal order for skilled nursing visits Frequency: 1x for 2 wks 1x wk every other week for 3 wks

## 2024-08-18 NOTE — Telephone Encounter (Signed)
 Pt called to speak to MD about MS WORSENING . Pt states that he is on 1/2 a tablet  of Diroximel Fumarate  (VUMERITY ) 231 MG CPDR  However his Ms is getting worse to the point Pt can't walk Pt states he is having a really hard time in the morning and at night . Pt states he has had a few falls and is not sure if its the medication  or something else. Pt would lie to discuss with MD ?

## 2024-08-18 NOTE — Telephone Encounter (Signed)
 Spoke with sister. Aware request went to Dr. Vear to see if ok to call adderall back in. Looks like it was d/c'd off med list when in rehab. Will need to see if Dr. Vear ok to call back in for him. She verbalized understanding.

## 2024-08-19 ENCOUNTER — Encounter: Payer: Self-pay | Admitting: Neurology

## 2024-08-19 DIAGNOSIS — G3184 Mild cognitive impairment, so stated: Secondary | ICD-10-CM | POA: Diagnosis not present

## 2024-08-19 DIAGNOSIS — F32A Depression, unspecified: Secondary | ICD-10-CM | POA: Diagnosis not present

## 2024-08-19 DIAGNOSIS — S72002D Fracture of unspecified part of neck of left femur, subsequent encounter for closed fracture with routine healing: Secondary | ICD-10-CM | POA: Diagnosis not present

## 2024-08-19 DIAGNOSIS — G35D Multiple sclerosis, unspecified: Secondary | ICD-10-CM | POA: Diagnosis not present

## 2024-08-19 DIAGNOSIS — N401 Enlarged prostate with lower urinary tract symptoms: Secondary | ICD-10-CM | POA: Diagnosis not present

## 2024-08-19 DIAGNOSIS — F529 Unspecified sexual dysfunction not due to a substance or known physiological condition: Secondary | ICD-10-CM | POA: Diagnosis not present

## 2024-08-21 DIAGNOSIS — F529 Unspecified sexual dysfunction not due to a substance or known physiological condition: Secondary | ICD-10-CM | POA: Diagnosis not present

## 2024-08-21 DIAGNOSIS — F32A Depression, unspecified: Secondary | ICD-10-CM | POA: Diagnosis not present

## 2024-08-21 DIAGNOSIS — G3184 Mild cognitive impairment, so stated: Secondary | ICD-10-CM | POA: Diagnosis not present

## 2024-08-21 DIAGNOSIS — G35D Multiple sclerosis, unspecified: Secondary | ICD-10-CM | POA: Diagnosis not present

## 2024-08-21 DIAGNOSIS — S72002D Fracture of unspecified part of neck of left femur, subsequent encounter for closed fracture with routine healing: Secondary | ICD-10-CM | POA: Diagnosis not present

## 2024-08-21 DIAGNOSIS — N401 Enlarged prostate with lower urinary tract symptoms: Secondary | ICD-10-CM | POA: Diagnosis not present

## 2024-08-22 DIAGNOSIS — G3184 Mild cognitive impairment, so stated: Secondary | ICD-10-CM | POA: Diagnosis not present

## 2024-08-22 DIAGNOSIS — S72002D Fracture of unspecified part of neck of left femur, subsequent encounter for closed fracture with routine healing: Secondary | ICD-10-CM | POA: Diagnosis not present

## 2024-08-22 DIAGNOSIS — F529 Unspecified sexual dysfunction not due to a substance or known physiological condition: Secondary | ICD-10-CM | POA: Diagnosis not present

## 2024-08-22 DIAGNOSIS — G35D Multiple sclerosis, unspecified: Secondary | ICD-10-CM | POA: Diagnosis not present

## 2024-08-22 DIAGNOSIS — N401 Enlarged prostate with lower urinary tract symptoms: Secondary | ICD-10-CM | POA: Diagnosis not present

## 2024-08-22 DIAGNOSIS — F32A Depression, unspecified: Secondary | ICD-10-CM | POA: Diagnosis not present

## 2024-08-25 NOTE — Telephone Encounter (Signed)
 Gracie from [facility] called to request verbal order for Pt Physical Therapy   Frequency  1x-1w 2x-4w 1x-3x  Callback # [phone]

## 2024-08-25 NOTE — Telephone Encounter (Signed)
 I called and spoke with Gabriel Hanson about the below.

## 2024-08-27 DIAGNOSIS — N401 Enlarged prostate with lower urinary tract symptoms: Secondary | ICD-10-CM | POA: Diagnosis not present

## 2024-08-27 DIAGNOSIS — G35D Multiple sclerosis, unspecified: Secondary | ICD-10-CM | POA: Diagnosis not present

## 2024-08-27 DIAGNOSIS — S72002D Fracture of unspecified part of neck of left femur, subsequent encounter for closed fracture with routine healing: Secondary | ICD-10-CM | POA: Diagnosis not present

## 2024-08-27 DIAGNOSIS — F32A Depression, unspecified: Secondary | ICD-10-CM | POA: Diagnosis not present

## 2024-08-27 DIAGNOSIS — F529 Unspecified sexual dysfunction not due to a substance or known physiological condition: Secondary | ICD-10-CM | POA: Diagnosis not present

## 2024-08-27 DIAGNOSIS — G3184 Mild cognitive impairment, so stated: Secondary | ICD-10-CM | POA: Diagnosis not present

## 2024-08-29 DIAGNOSIS — N401 Enlarged prostate with lower urinary tract symptoms: Secondary | ICD-10-CM | POA: Diagnosis not present

## 2024-08-29 DIAGNOSIS — S72002D Fracture of unspecified part of neck of left femur, subsequent encounter for closed fracture with routine healing: Secondary | ICD-10-CM | POA: Diagnosis not present

## 2024-08-29 DIAGNOSIS — F32A Depression, unspecified: Secondary | ICD-10-CM | POA: Diagnosis not present

## 2024-08-29 DIAGNOSIS — G35D Multiple sclerosis, unspecified: Secondary | ICD-10-CM | POA: Diagnosis not present

## 2024-08-29 DIAGNOSIS — F529 Unspecified sexual dysfunction not due to a substance or known physiological condition: Secondary | ICD-10-CM | POA: Diagnosis not present

## 2024-08-29 DIAGNOSIS — G3184 Mild cognitive impairment, so stated: Secondary | ICD-10-CM | POA: Diagnosis not present

## 2024-09-01 DIAGNOSIS — G3184 Mild cognitive impairment, so stated: Secondary | ICD-10-CM | POA: Diagnosis not present

## 2024-09-01 DIAGNOSIS — G35D Multiple sclerosis, unspecified: Secondary | ICD-10-CM | POA: Diagnosis not present

## 2024-09-01 DIAGNOSIS — S72002D Fracture of unspecified part of neck of left femur, subsequent encounter for closed fracture with routine healing: Secondary | ICD-10-CM | POA: Diagnosis not present

## 2024-09-01 DIAGNOSIS — F32A Depression, unspecified: Secondary | ICD-10-CM | POA: Diagnosis not present

## 2024-09-01 DIAGNOSIS — N401 Enlarged prostate with lower urinary tract symptoms: Secondary | ICD-10-CM | POA: Diagnosis not present

## 2024-09-01 DIAGNOSIS — F529 Unspecified sexual dysfunction not due to a substance or known physiological condition: Secondary | ICD-10-CM | POA: Diagnosis not present

## 2024-09-02 ENCOUNTER — Telehealth: Payer: Self-pay | Admitting: Neurology

## 2024-09-02 DIAGNOSIS — G35D Multiple sclerosis, unspecified: Secondary | ICD-10-CM

## 2024-09-02 DIAGNOSIS — G3184 Mild cognitive impairment, so stated: Secondary | ICD-10-CM | POA: Diagnosis not present

## 2024-09-02 DIAGNOSIS — S72002D Fracture of unspecified part of neck of left femur, subsequent encounter for closed fracture with routine healing: Secondary | ICD-10-CM | POA: Diagnosis not present

## 2024-09-02 DIAGNOSIS — F529 Unspecified sexual dysfunction not due to a substance or known physiological condition: Secondary | ICD-10-CM | POA: Diagnosis not present

## 2024-09-02 DIAGNOSIS — N401 Enlarged prostate with lower urinary tract symptoms: Secondary | ICD-10-CM | POA: Diagnosis not present

## 2024-09-02 DIAGNOSIS — F32A Depression, unspecified: Secondary | ICD-10-CM | POA: Diagnosis not present

## 2024-09-02 NOTE — Telephone Encounter (Signed)
 At 2:28 Signe Edison, OT w/ [facility] [phone]) left a vm requesting verbal orders for OT for 1 week 4.  He is also asking for  Rx for a 3 in 1 Potty chair for pt to be sent to [facility].

## 2024-09-03 ENCOUNTER — Other Ambulatory Visit: Payer: Self-pay | Admitting: Neurology

## 2024-09-03 ENCOUNTER — Ambulatory Visit (INDEPENDENT_AMBULATORY_CARE_PROVIDER_SITE_OTHER): Admitting: Orthopaedic Surgery

## 2024-09-03 ENCOUNTER — Other Ambulatory Visit (INDEPENDENT_AMBULATORY_CARE_PROVIDER_SITE_OTHER): Payer: Self-pay

## 2024-09-03 DIAGNOSIS — M25552 Pain in left hip: Secondary | ICD-10-CM

## 2024-09-03 DIAGNOSIS — N401 Enlarged prostate with lower urinary tract symptoms: Secondary | ICD-10-CM | POA: Diagnosis not present

## 2024-09-03 DIAGNOSIS — S72002D Fracture of unspecified part of neck of left femur, subsequent encounter for closed fracture with routine healing: Secondary | ICD-10-CM | POA: Diagnosis not present

## 2024-09-03 DIAGNOSIS — F529 Unspecified sexual dysfunction not due to a substance or known physiological condition: Secondary | ICD-10-CM | POA: Diagnosis not present

## 2024-09-03 DIAGNOSIS — F32A Depression, unspecified: Secondary | ICD-10-CM | POA: Diagnosis not present

## 2024-09-03 DIAGNOSIS — R269 Unspecified abnormalities of gait and mobility: Secondary | ICD-10-CM

## 2024-09-03 DIAGNOSIS — G35D Multiple sclerosis, unspecified: Secondary | ICD-10-CM | POA: Diagnosis not present

## 2024-09-03 DIAGNOSIS — G35C1 Active secondary progressive multiple sclerosis: Secondary | ICD-10-CM | POA: Insufficient documentation

## 2024-09-03 DIAGNOSIS — G3184 Mild cognitive impairment, so stated: Secondary | ICD-10-CM | POA: Diagnosis not present

## 2024-09-03 DIAGNOSIS — S72012A Unspecified intracapsular fracture of left femur, initial encounter for closed fracture: Secondary | ICD-10-CM

## 2024-09-03 NOTE — Progress Notes (Signed)
 Post-Op Visit Note   Patient: Gabriel Hanson           Date of Birth: [birth year]           MRN: [id number] Visit Date: 09/03/2024 PCP: Caleen Dirks, MD   Assessment & Plan:  Chief Complaint:  Chief Complaint  Patient presents with   Left Hip - Pain, Routine Post Op    L THA-07/20/2024   Visit Diagnoses:  1. Closed subcapital fracture of neck of femur, left, initial encounter (HCC)   2. Pain in left hip     Plan: History of Present Illness Gabriel Hanson is a 73 year old male who presents for follow-up after left total hip replacement surgery six weeks ago for femoral neck fracture.  He notes new mild hip pain today, first noticed on arrival to this visit. He has otherwise had no significant pain or complications since surgery. He is at home after discharge from rehabilitation and feels well overall. He is here for suture removal. Many sutures have already fallen out on his own, and he notes mild redness at the incision site.  Physical Exam MUSCULOSKELETAL: Incision area with slight redness, likely suture reaction.  Results Procedure: Suture removal    Assessment and Plan Status post left hip replacement Six weeks post-surgery with mild pain and suture reaction. Incision redness likely from suture reaction. Recovery progressing well. - Suture removal planned for future visit. - No further follow-up unless issues arise. - Continue current activity level as tolerated.  Follow-Up Instructions: No follow-ups on file.   Orders:  Orders Placed This Encounter  Procedures   XR HIP UNILAT W OR W/O PELVIS 2-3 VIEWS LEFT   No orders of the defined types were placed in this encounter.   Imaging: XR HIP UNILAT W OR W/O PELVIS 2-3 VIEWS LEFT Result Date: 09/03/2024 Xrays show a stable left total hip replacement without complication   PMFS History: Patient Active Problem List   Diagnosis Date Noted   Active secondary progressive multiple sclerosis 09/03/2024   Closed  subcapital fracture of neck of femur, left, initial encounter (HCC) 07/19/2024   Altered mental status 01/19/2022   Leukocytosis 01/19/2022   Hypoalbuminemia due to protein-calorie malnutrition 01/19/2022   Hyperglycemia 01/19/2022   Dehydration 01/19/2022   Sepsis (HCC) 01/19/2022   CAP (community acquired pneumonia) 01/18/2022   Attention deficit 12/17/2017   Numbness 05/17/2015   Gait disturbance 05/17/2015   High risk medication use 01/15/2015   Urinary hesitancy 01/15/2015   Cognitive dysfunction 03/18/2010   Erectile dysfunction 03/18/2010   Depression 03/18/2010   Multiple sclerosis 03/18/2010   Disorder of eye 03/18/2010   Constipation 03/18/2010   RECTAL BLEEDING 03/18/2010   PSORIASIS 03/18/2010   WEIGHT LOSS 03/18/2010   Past Medical History:  Diagnosis Date   Depressive disorder, not elsewhere classified    Disorder of eye, unspecified    Memory loss    Multiple sclerosis    Other persistent mental disorders due to conditions classified elsewhere    Other psoriasis    Psychosexual dysfunction, unspecified     Family History  Problem Relation Age of Onset   Heart attack Mother    COPD Father    Hypotension Sister    Heart disease Other     Past Surgical History:  Procedure Laterality Date   KNEE ARTHROSCOPY     right    TONSILLECTOMY     TOTAL HIP ARTHROPLASTY Left 07/20/2024   Procedure: ARTHROPLASTY, HIP, TOTAL, ANTERIOR APPROACH;  Surgeon: Jerri Kay HERO, MD;  Location: [facility];  Service: Orthopedics;  Laterality: Left;   Social History   Occupational History   Occupation: Energy Manager: [employer]    Comment: Fulltime  Tobacco Use   Smoking status: Heavy Smoker    Current packs/day: 1.00    Average packs/day: 1 pack/day for 44.0 years (44.0 ttl pk-yrs)    Types: Cigarettes   Smokeless tobacco: Never  Vaping Use   Vaping status: Every Day  Substance and Sexual Activity   Alcohol  use: Yes    Alcohol /week: 0.0 standard drinks  of alcohol     Comment: Occasionally/fim   Drug use: Yes    Types: Marijuana   Sexual activity: Not on file

## 2024-09-03 NOTE — Telephone Encounter (Signed)
 Called and spoke to jim and gave verbal orders for pt and stated that we would rx the potty chair to D[facility]. Order sent but unable to print will route to Dr. Vear to see if its awaiting his cosign to print

## 2024-09-03 NOTE — Telephone Encounter (Signed)
 Order signed and faxed to [facility] at [phone] and receipt of fax confirmation was received

## 2024-09-03 NOTE — Addendum Note (Signed)
 Addended by: ONEITA NEVELYN BRAVO on: 09/03/2024 08:34 AM   Modules accepted: Orders

## 2024-09-03 NOTE — Telephone Encounter (Signed)
 Awaiting provider signature

## 2024-09-09 DIAGNOSIS — N401 Enlarged prostate with lower urinary tract symptoms: Secondary | ICD-10-CM | POA: Diagnosis not present

## 2024-09-09 DIAGNOSIS — S72002D Fracture of unspecified part of neck of left femur, subsequent encounter for closed fracture with routine healing: Secondary | ICD-10-CM | POA: Diagnosis not present

## 2024-09-09 DIAGNOSIS — G35D Multiple sclerosis, unspecified: Secondary | ICD-10-CM | POA: Diagnosis not present

## 2024-09-09 DIAGNOSIS — F32A Depression, unspecified: Secondary | ICD-10-CM | POA: Diagnosis not present

## 2024-09-09 DIAGNOSIS — F529 Unspecified sexual dysfunction not due to a substance or known physiological condition: Secondary | ICD-10-CM | POA: Diagnosis not present

## 2024-09-09 DIAGNOSIS — G3184 Mild cognitive impairment, so stated: Secondary | ICD-10-CM | POA: Diagnosis not present

## 2024-09-10 DIAGNOSIS — G35D Multiple sclerosis, unspecified: Secondary | ICD-10-CM | POA: Diagnosis not present

## 2024-09-10 DIAGNOSIS — F32A Depression, unspecified: Secondary | ICD-10-CM | POA: Diagnosis not present

## 2024-09-10 DIAGNOSIS — S72002D Fracture of unspecified part of neck of left femur, subsequent encounter for closed fracture with routine healing: Secondary | ICD-10-CM | POA: Diagnosis not present

## 2024-09-10 DIAGNOSIS — G3184 Mild cognitive impairment, so stated: Secondary | ICD-10-CM | POA: Diagnosis not present

## 2024-09-10 DIAGNOSIS — F529 Unspecified sexual dysfunction not due to a substance or known physiological condition: Secondary | ICD-10-CM | POA: Diagnosis not present

## 2024-09-10 DIAGNOSIS — N401 Enlarged prostate with lower urinary tract symptoms: Secondary | ICD-10-CM | POA: Diagnosis not present

## 2024-09-10 NOTE — Telephone Encounter (Signed)
 I called and spoke to pt sister who stated that she ordered one out of pocket and this was no longer needed. But they did miss 6 month follow up so I scheduled an appt anyway

## 2024-09-10 NOTE — Telephone Encounter (Signed)
 Gabriel Hanson- it looks like he needs an appointment to document medical necessity for bedside commode. Can you call to schedule an appt?

## 2024-09-11 DIAGNOSIS — M1711 Unilateral primary osteoarthritis, right knee: Secondary | ICD-10-CM | POA: Diagnosis not present

## 2024-09-18 ENCOUNTER — Other Ambulatory Visit: Payer: Self-pay | Admitting: Neurology

## 2024-09-18 DIAGNOSIS — G35D Multiple sclerosis, unspecified: Secondary | ICD-10-CM

## 2024-09-24 NOTE — Telephone Encounter (Signed)
 Last seen on 01/16/24 Follow up scheduled on 10/06/24

## 2024-10-06 ENCOUNTER — Ambulatory Visit (INDEPENDENT_AMBULATORY_CARE_PROVIDER_SITE_OTHER): Admitting: Neurology

## 2024-10-06 ENCOUNTER — Encounter: Payer: Self-pay | Admitting: Neurology

## 2024-10-06 VITALS — BP 110/72 | HR 80 | Ht 73.0 in | Wt 181.5 lb

## 2024-10-06 DIAGNOSIS — R269 Unspecified abnormalities of gait and mobility: Secondary | ICD-10-CM

## 2024-10-06 DIAGNOSIS — R41840 Attention and concentration deficit: Secondary | ICD-10-CM | POA: Diagnosis not present

## 2024-10-06 DIAGNOSIS — G35C1 Active secondary progressive multiple sclerosis: Secondary | ICD-10-CM | POA: Diagnosis not present

## 2024-10-06 DIAGNOSIS — F09 Unspecified mental disorder due to known physiological condition: Secondary | ICD-10-CM | POA: Diagnosis not present

## 2024-10-06 DIAGNOSIS — Z79899 Other long term (current) drug therapy: Secondary | ICD-10-CM

## 2024-10-06 DIAGNOSIS — R3911 Hesitancy of micturition: Secondary | ICD-10-CM | POA: Diagnosis not present

## 2024-10-06 DIAGNOSIS — R200 Anesthesia of skin: Secondary | ICD-10-CM

## 2024-10-06 MED ORDER — AMPHETAMINE-DEXTROAMPHETAMINE 10 MG PO TABS: ORAL_TABLET | ORAL | 0 refills | Status: AC

## 2024-10-06 NOTE — Progress Notes (Signed)
 "  [facility]  PATIENT: Gabriel Hanson DOB: 04-16-1951  REFERRING DOCTOR OR PCP:  None  SOURCE: patient  _________________________________   HISTORICAL  CHIEF COMPLAINT:  Chief Complaint  Patient presents with   Follow-up    Room 10 - with sister today. Ambulating with a walker. Notes eye exam since last visit, 20/30. Found new PCP.    Multiple Sclerosis    Vumerity , labs and repeat MRI ordered at last visit. Notes several falls, one resulting in hip fracture with surgical intervention. PT and OT s/p fall. Has concerns about walking 1/2 mile down [employer] for Cigarettes.    Cervical spinal stenosis    per last visit note - will request a [facility] opinion regarding surgery   Gait difficulty    worsening over the last couple of years    Cognitive dysfunction    Worsening over time.    ADHD    Ritalin    Lumbar radiculopathy    Referred to ortho at last visit - has been seen, surgery was recommended.    right knee pain    Referred to ortho at last visit. Seen at [facility] - had steroid injection x 2.     HISTORY OF PRESENT ILLNESS:  Gabriel Hanson is a 73 y.o. man with relapsing remitting MS.       Update 10/06/2024 He is on Vumerity .   He tolerates it well now.    Lymphocytes were 0.5. So we reduced dose to 231 mg po bid.   Lymphocyte improved to 0.7 last 4 checks since 2023 on the lower dose.  We will check again today.SABRA  He denies any relapses but has noted some progressive changes in gait and his sister has noted progressive cognitive changes.     Gabapentin  helped the leg pain.  After few months she stopped and the leg pain has continued to do fairly well.  He has poor gait.  He broke his hip with a fall in the house (only was using walker outside and not inside).  He has done PT/OT.  Besides MS he has severe spinal stenosis at C3-C4.   He has seen [facility] and surgery was recommended.    Balance is more of an issue than strength However, sometimes  the right leg will give out.  He lives alone but sister helps a lot.   .He has a shower chair.   He has numbness in toes, left > right.  He has urinary urgency and some incontinence, progressing over the past couple of years.   He usually has urgency before the leakage incontinence.  He wears depends when he goes out of his house and at night.  He has nocturia.    Silodosin  helps more than tamsulosin .    He has difficulty with memory and other cognitive skills.  STM is worse than last year.SABRA  He gets help from  his sister.  He has reduced focus/attention, improved with Adderall. SABRA  He scored 27/30 on the MoCA in 2023 but only 19/30 in 2024.  His sister notes his cognitive function fluctuates quite a bit.  He seems better today than at last visit.     He has more insomnia due to pain and bladder.    Mood improved on sertraline  but he recently stopped to simplify med's.    .  He has a torn right meniscus an has a lot of pain at times.  Injections have not helped long term.  03/21/2023    1:03 PM 01/16/2022   11:57 AM  Montreal Cognitive Assessment   Visuospatial/ Executive (0/5) 3 5  Naming (0/3) 3 3  Attention: Read list of digits (0/2) 2 2  Attention: Read list of letters (0/1) 1 1  Attention: Serial 7 subtraction starting at 100 (0/3) 3 3  Language: Repeat phrase (0/2) 1 1  Language : Fluency (0/1) 0 1  Abstraction (0/2) 1 2  Delayed Recall (0/5) 0 3  Orientation (0/6) 5 6  Total 19 27    MS History:  He was diagnosed with MS in 2001 when he presented with right visual loss.  He was having mild gait disturbance at that time.   He had an MRI worrisome for MS.   He was placed on IV steroids and vision got 80% better.    He was placed on an interferon first and then Copaxone because he had tolerability issues.     For two years, he was without insurance and stopped all DMT.   I started seeing him in 2015 and we started Tecfidera .   He tolerates it well but sometimes forgets the second  pill because all his medications are qAM.  MRI brain 2011 shows white matter foci in the periventricular and juxtacortical white matter consistent with the clinical diagnosis of MS. The right optic nerve was reduced in size. There w was one focus in the right pons and one in the left cerebellar hemisphere, as well. None of the foci were enhancing during that MRI.   MRI 04/01/2019 brain showed supratentorial and infratentorial lesions unchanged. Compared to 02/22/2016  MRi cervical spine 01/11/2024 showed T2 hyperintense foci within the brainstem, cerebellum and possibly adjacent to C2 are consistent with chronic demyelinating plaque associated with multiple sclerosis.  None of these foci appear to be acute and they were also present on the MRI from 2022.SABRA    At C3-C4, there is moderately severe spinal stenosis (AP diameter 6.4 mm) due to degenerative changes.  The sagittal images, there appears to be subtle T2 hyperintense signal above and below the point of maximum stenosis that could be due to the myelopathy.  The foci are not confirmed on the axial views.  Additionally at this level there is left greater than right foraminal narrowing that could lead to left C4 nerve root compression.  MRI lumbar spine 01/11/2024 showed multilevel DJD but no critical stneosis.  REVIEW OF SYSTEMS: Constitutional: No fevers, chills, sweats, or change in appetite.  He notes some fatigue. Occasional insomnia. Eyes: No visual changes, double vision, eye pain Ear, nose and throat: No hearing loss, ear pain, nasal congestion, sore throat Cardiovascular: No chest pain, palpitations Respiratory:  No shortness of breath at rest or with exertion.   No wheezes GastrointestinaI: No nausea, vomiting, diarrhea, abdominal pain, fecal incontinence.  Severe constipation Genitourinary:  as above.  Musculoskeletal:  No neck pain, back pain Integumentary: No rash, pruritus, skin lesions Neurological: as above Psychiatric: Mild  depression. Cognitive decline. Endocrine: No palpitations, diaphoresis, change in appetite, change in weigh or increased thirst Hematologic/Lymphatic:  No anemia, purpura, petechiae. Allergic/Immunologic: No itchy/runny eyes, nasal congestion, recent allergic reactions, rashes  ALLERGIES: No Known Allergies  HOME MEDICATIONS:  Current Outpatient Medications:    docusate sodium  (COLACE) 100 MG capsule, Take 1 capsule (100 mg total) by mouth 2 (two) times daily., Disp: 30 capsule, Rfl: 0   polyethylene glycol (MIRALAX  / GLYCOLAX ) 17 g packet, Take 17 g by mouth daily., Disp: 14 each,  Rfl: 0   silodosin  (RAPAFLO ) 8 MG CAPS capsule, Take 1 capsule (8 mg total) by mouth daily with breakfast., Disp: 90 capsule, Rfl: 3   amphetamine -dextroamphetamine  (ADDERALL) 10 MG tablet, One po at breakfast and oe po at lunch, Disp: 60 tablet, Rfl: 0   enoxaparin  (LOVENOX ) 40 MG/0.4ML injection, Inject 0.4 mLs (40 mg total) into the skin daily for 14 days., Disp: 5.6 mL, Rfl: 0   oxyCODONE -acetaminophen  (PERCOCET) 5-325 MG tablet, Take 1-2 tablets by mouth 2 (two) times daily as needed for severe pain (pain score 7-10). (Patient not taking: Reported on 10/06/2024), Disp: 20 tablet, Rfl: 0  PAST MEDICAL HISTORY: Past Medical History:  Diagnosis Date   Depressive disorder, not elsewhere classified    Disorder of eye, unspecified    Memory loss    Multiple sclerosis    Other persistent mental disorders due to conditions classified elsewhere    Other psoriasis    Psychosexual dysfunction, unspecified     PAST SURGICAL HISTORY: Past Surgical History:  Procedure Laterality Date   KNEE ARTHROSCOPY     right    TONSILLECTOMY     TOTAL HIP ARTHROPLASTY Left 07/20/2024   Procedure: ARTHROPLASTY, HIP, TOTAL, ANTERIOR APPROACH;  Surgeon: Jerri Kay HERO, MD;  Location: [facility];  Service: Orthopedics;  Laterality: Left;    FAMILY HISTORY: Family History  Problem Relation Age of Onset   Heart attack Mother     COPD Father    Hypotension Sister    Heart disease Other     SOCIAL HISTORY:  Social History   Socioeconomic History   Marital status: Single    Spouse name: Not on file   Number of children: 0   Years of education: Not on file   Highest education level: Bachelor's degree (e.g., BA, AB, BS)  Occupational History   Occupation: Energy Manager: [employer]    Comment: Fulltime  Tobacco Use   Smoking status: Heavy Smoker    Current packs/day: 1.00    Average packs/day: 1 pack/day for 44.0 years (44.0 ttl pk-yrs)    Types: Cigarettes   Smokeless tobacco: Never  Vaping Use   Vaping status: Every Day  Substance and Sexual Activity   Alcohol  use: Yes    Alcohol /week: 0.0 standard drinks of alcohol     Comment: Occasionally/fim   Drug use: Yes    Types: Marijuana   Sexual activity: Not on file  Other Topics Concern   Not on file  Social History Narrative   Not on file   Social Drivers of Health   Tobacco Use: High Risk (10/06/2024)   Patient History    Smoking Tobacco Use: Heavy Smoker    Smokeless Tobacco Use: Never    Passive Exposure: Not on file  Financial Resource Strain: Not on file  Food Insecurity: No Food Insecurity (07/19/2024)   Epic    Worried About Programme Researcher, Broadcasting/film/video in the Last Year: Never true    [employer] of Food in the Last Year: Never true  Transportation Needs: No Transportation Needs (07/19/2024)   Epic    Lack of Transportation (Medical): No    Lack of Transportation (Non-Medical): No  Physical Activity: Not on file  Stress: Not on file  Social Connections: Socially Isolated (07/19/2024)   Social Connection and Isolation Panel    Frequency of Communication with Friends and Family: Never    Frequency of Social Gatherings with Friends and Family: Never    Attends Religious Services: Never  Active Member of Clubs or Organizations: No    Attends Banker Meetings: Never    Marital Status: Never married  Intimate  Partner Violence: Not At Risk (07/19/2024)   Epic    Fear of Current or Ex-Partner: No    Emotionally Abused: No    Physically Abused: No    Sexually Abused: No  Depression (PHQ2-9): Not on file  Alcohol  Screen: Not on file  Housing: Low Risk (07/19/2024)   Epic    Unable to Pay for Housing in the Last Year: No    Number of Times Moved in the Last Year: 0    Homeless in the Last Year: No  Utilities: Not At Risk (07/19/2024)   Epic    Threatened with loss of utilities: No  Health Literacy: Not on file     PHYSICAL EXAM  Vitals:   10/06/24 1438  BP: 110/72  Pulse: 80  SpO2: 97%  Weight: 181 lb 8 oz (82.3 kg)  Height: 6' 1 (1.854 m)     Body mass index is 23.95 kg/m.   General: The patient is well-developed and well-nourished and in no acute distress  Neurologic Exam  Mental status: The patient is alert and oriented x 3 at the time of the examination.  Focus and attention are reduced.  Short-term memory is reduced.  He did not recall part of the conversation 5 minutes later.  See MoCA score from earlier this year above speech is normal.  Cranial nerves: Extraocular movements show a left INO but no diplopia. He has a 1+ APD on the right.   Color vision is desaturated OD.  Facial strength and sensation is normal. Trapezius strength is normal. No obvious hearing deficits are noted.  Motor:  Muscle bulk is normal.   He has increased muscle tone of the legs.  Strength is  5 / 5 in all 4 extremities   However, reduced RAM in left arm and foot    Sensory: Sensation is normal in his arms but reduced in the feet, more so on the left than the right.  Vibration sensation is 10% at the left ankle and 50% at the right ankle, absent at the left toes and 10% of the right toes.  He has some length dependent reduced pinprick sensation in the left foot, worse in the L5 distribution compared to the S1 distribution.  Coordination: Cerebellar testing shows good bilateral finger-nose-finger  slightly but reduced bilateral heel-to-shin  Gait and station: Station is normal.   gait is spastic and shows slight right foot drop.  He can walk faster with the walker that has a cane.    He cannot do a tandem gait.  Romberg was positive.  Reflexes: Deep tendon reflexes are symmetric and mildly increased in the Legs with spread at the knees.  No ankle clonus.    DIAGNOSTIC DATA (LABS, IMAGING, TESTING) - I reviewed patient records, labs, notes, testing and imaging myself where available.  Lab Results  Component Value Date   WBC 8.4 07/22/2024   HGB 11.3 (L) 07/22/2024   HCT 33.4 (L) 07/22/2024   MCV 92.8 07/22/2024   PLT 190 07/22/2024      Component Value Date/Time   NA 134 (L) 07/22/2024 1115   NA 136 03/21/2023 1441   K 4.3 07/22/2024 1115   CL 98 07/22/2024 1115   CO2 28 07/22/2024 1115   GLUCOSE 85 07/22/2024 1115   BUN 13 07/22/2024 1115   BUN 11 03/21/2023 1441  CREATININE 0.61 07/22/2024 1115   CALCIUM 8.7 (L) 07/22/2024 1115   PROT 6.6 03/21/2023 1441   ALBUMIN 4.4 03/21/2023 1441   AST 16 03/21/2023 1441   ALT 10 03/21/2023 1441   ALKPHOS 91 03/21/2023 1441   BILITOT 0.7 03/21/2023 1441   GFRNONAA >60 07/22/2024 1115   GFRAA 103 09/28/2017 1000       ASSESSMENT AND PLAN  Active secondary progressive multiple sclerosis - Plan: Ambulatory referral to Urology  Urinary hesitancy - Plan: Ambulatory referral to Urology  Numbness  High risk medication use  Gait disturbance  Attention deficit  Cognitive dysfunction   1.   We had a discussion about secondary progressive MS.  Most patients have reduced inflammatory activity causing new lesions as a age.  At age 39, it is possible that the risks of therapy are higher than the benefits.  We will stop Vumerity  (was on 231 mg po bid (half dose) is still causing low lymphocyte count     Consider repeat MRI in 6-12 months to see if progression off the DMT 2.  Gait has worsened, and neck pain has worsened  since his fall.  We discussed MRI cervical findings - C3-C4 with mild hyperintense change c/w mild myelopathy --- advised to reconsider surgery and get back in with the spine surgeon..  Due to MS hard to know how much of the gait issue is due to MS and how much to myelopathy so unclear how much benefit he will receive.    3.    Adderall 10 mg bid.    Continue silodosin .  We discussed referral to urology as urinary function has worsened 4.    We also discussed that there is risk in his living alone and that he may need to reconsider assisted living or other arrangements.  Fortunately, his sister is able to check in on him at least once a week  rtc 6 months.  This visit is part of a comprehensive longitudinal care medical relationship regarding the patients primary diagnosis of MS and related concerns.  40-minute office visit with the majority of the time spent face-to-face for history and physical, discussion/counseling and decision-making.  Additional time with record review and documentation.   Farmer Mccahill A. Vear, MD, PhD 10/06/2024, 4:44 PM Certified in Neurology, Clinical Neurophysiology, Sleep Medicine, Pain Medicine and Neuroimaging  [facility] [address] [place], [place] [address] [phone]     "

## 2024-10-07 ENCOUNTER — Telehealth: Payer: Self-pay | Admitting: Neurology

## 2024-10-07 NOTE — Telephone Encounter (Signed)
 Referral to  Urology faxed to [facility]   [facility]  Phone: [phone] Fax: [phone]

## 2025-06-02 ENCOUNTER — Ambulatory Visit: Admitting: Neurology
